# Patient Record
Sex: Female | Born: 1955 | Hispanic: Yes | Marital: Married | Smoking: Never smoker
Health system: Southern US, Community
[De-identification: ages and names within clinical notes are randomized; demographics above are authoritative.]

## PROBLEM LIST (undated history)

## (undated) DIAGNOSIS — R112 Nausea with vomiting, unspecified: Secondary | ICD-10-CM

## (undated) DIAGNOSIS — N83209 Unspecified ovarian cyst, unspecified side: Secondary | ICD-10-CM

## (undated) DIAGNOSIS — K8020 Calculus of gallbladder without cholecystitis without obstruction: Secondary | ICD-10-CM

## (undated) DIAGNOSIS — Z9889 Other specified postprocedural states: Secondary | ICD-10-CM

## (undated) DIAGNOSIS — M1990 Unspecified osteoarthritis, unspecified site: Secondary | ICD-10-CM

## (undated) DIAGNOSIS — I10 Essential (primary) hypertension: Secondary | ICD-10-CM

## (undated) DIAGNOSIS — T8859XA Other complications of anesthesia, initial encounter: Secondary | ICD-10-CM

## (undated) DIAGNOSIS — E119 Type 2 diabetes mellitus without complications: Secondary | ICD-10-CM

## (undated) HISTORY — DX: Unspecified ovarian cyst, unspecified side: N83.209

## (undated) HISTORY — PX: OTHER SURGICAL HISTORY: SHX169

## (undated) HISTORY — DX: Type 2 diabetes mellitus without complications: E11.9

---

## 2005-09-21 ENCOUNTER — Ambulatory Visit: Payer: Self-pay | Admitting: Family Medicine

## 2005-11-23 ENCOUNTER — Ambulatory Visit: Payer: Self-pay | Admitting: Family Medicine

## 2006-06-18 ENCOUNTER — Ambulatory Visit: Payer: Self-pay | Admitting: Family Medicine

## 2006-09-29 ENCOUNTER — Ambulatory Visit: Payer: Self-pay | Admitting: Family Medicine

## 2006-09-29 LAB — CONVERTED CEMR LAB
Calcium: 8.7 mg/dL (ref 8.4–10.5)
GFR calc Af Amer: 137 mL/min
GFR calc non Af Amer: 113 mL/min
HCT: 37.9 % (ref 36.0–46.0)
Hemoglobin: 13.1 g/dL (ref 12.0–15.0)
LDL Cholesterol: 128 mg/dL — ABNORMAL HIGH (ref 0–99)
MCV: 84.7 fL (ref 78.0–100.0)
Monocytes Absolute: 0.4 10*3/uL (ref 0.2–0.7)
Monocytes Relative: 7 % (ref 3.0–11.0)
Neutro Abs: 3.3 10*3/uL (ref 1.4–7.7)
Potassium: 3.8 meq/L (ref 3.5–5.1)
RBC: 4.48 M/uL (ref 3.87–5.11)
RDW: 13.5 % (ref 11.5–14.6)
TSH: 2.07 microintl units/mL (ref 0.35–5.50)
Total CHOL/HDL Ratio: 5
Total Protein: 7.3 g/dL (ref 6.0–8.3)
Triglycerides: 132 mg/dL (ref 0–149)
VLDL: 26 mg/dL (ref 0–40)
WBC: 5.9 10*3/uL (ref 4.5–10.5)

## 2006-10-06 ENCOUNTER — Encounter: Payer: Self-pay | Admitting: Family Medicine

## 2006-10-06 ENCOUNTER — Ambulatory Visit: Payer: Self-pay | Admitting: Family Medicine

## 2006-10-06 ENCOUNTER — Other Ambulatory Visit: Admission: RE | Admit: 2006-10-06 | Discharge: 2006-10-06 | Payer: Self-pay | Admitting: Family Medicine

## 2006-10-14 ENCOUNTER — Encounter: Admission: RE | Admit: 2006-10-14 | Discharge: 2006-10-14 | Payer: Self-pay | Admitting: Family Medicine

## 2006-11-15 ENCOUNTER — Encounter: Admission: RE | Admit: 2006-11-15 | Discharge: 2006-11-15 | Payer: Self-pay | Admitting: General Surgery

## 2006-11-16 ENCOUNTER — Encounter (INDEPENDENT_AMBULATORY_CARE_PROVIDER_SITE_OTHER): Payer: Self-pay | Admitting: Specialist

## 2006-11-16 ENCOUNTER — Ambulatory Visit (HOSPITAL_BASED_OUTPATIENT_CLINIC_OR_DEPARTMENT_OTHER): Admission: RE | Admit: 2006-11-16 | Discharge: 2006-11-16 | Payer: Self-pay | Admitting: General Surgery

## 2008-05-22 ENCOUNTER — Ambulatory Visit: Payer: Self-pay | Admitting: Family Medicine

## 2008-06-19 ENCOUNTER — Ambulatory Visit: Payer: Self-pay | Admitting: Family Medicine

## 2008-06-19 LAB — CONVERTED CEMR LAB
Bilirubin Urine: NEGATIVE
Blood in Urine, dipstick: NEGATIVE
Ketones, urine, test strip: NEGATIVE
Specific Gravity, Urine: 1.015
Urobilinogen, UA: 0.2
pH: 6

## 2008-06-25 ENCOUNTER — Encounter: Payer: Self-pay | Admitting: Family Medicine

## 2008-06-25 LAB — CONVERTED CEMR LAB
ALT: 19 U/L
AST: 28 U/L
Albumin: 3.5 g/dL
Alkaline Phosphatase: 81 U/L
BUN: 12 mg/dL
Basophils Absolute: 0 10*3/uL
Basophils Relative: 0.2 %
Bilirubin, Direct: 0.1 mg/dL
CO2: 27 meq/L
Calcium: 8.6 mg/dL
Chloride: 105 meq/L
Cholesterol: 175 mg/dL
Creatinine, Ser: 0.6 mg/dL
Eosinophils Absolute: 0.2 10*3/uL
Eosinophils Relative: 3.9 %
GFR calc Af Amer: 136 mL/min
GFR calc non Af Amer: 112 mL/min
Glucose, Bld: 94 mg/dL
HCT: 38.2 %
HDL: 36.8 mg/dL — ABNORMAL LOW
Hemoglobin: 12.9 g/dL
LDL Cholesterol: 116 mg/dL — ABNORMAL HIGH
Lymphocytes Relative: 28.4 %
MCHC: 33.7 g/dL
MCV: 86.2 fL
Monocytes Absolute: 0.4 10*3/uL
Monocytes Relative: 6.8 %
Neutro Abs: 3.3 10*3/uL
Neutrophils Relative %: 60.7 %
Platelets: 290 10*3/uL
Potassium: 3.8 meq/L
RBC: 4.43 M/uL
RDW: 13.5 %
Sodium: 138 meq/L
TSH: 1.28 u[IU]/mL
Total Bilirubin: 0.7 mg/dL
Total CHOL/HDL Ratio: 4.8
Total Protein: 7.5 g/dL
Triglycerides: 111 mg/dL
VLDL: 22 mg/dL
WBC: 5.5 10*3/uL

## 2008-06-27 ENCOUNTER — Encounter: Payer: Self-pay | Admitting: Family Medicine

## 2008-06-27 ENCOUNTER — Ambulatory Visit: Payer: Self-pay | Admitting: Family Medicine

## 2008-06-27 ENCOUNTER — Other Ambulatory Visit: Admission: RE | Admit: 2008-06-27 | Discharge: 2008-06-27 | Payer: Self-pay | Admitting: Family Medicine

## 2008-07-02 ENCOUNTER — Encounter: Payer: Self-pay | Admitting: Family Medicine

## 2008-11-01 ENCOUNTER — Encounter: Admission: RE | Admit: 2008-11-01 | Discharge: 2008-11-01 | Payer: Self-pay | Admitting: Family Medicine

## 2009-05-03 ENCOUNTER — Ambulatory Visit: Payer: Self-pay | Admitting: Family Medicine

## 2009-05-03 DIAGNOSIS — L259 Unspecified contact dermatitis, unspecified cause: Secondary | ICD-10-CM

## 2010-01-31 ENCOUNTER — Ambulatory Visit: Payer: Self-pay | Admitting: Family Medicine

## 2010-01-31 DIAGNOSIS — L509 Urticaria, unspecified: Secondary | ICD-10-CM

## 2010-05-26 ENCOUNTER — Ambulatory Visit: Payer: Self-pay | Admitting: Family Medicine

## 2010-10-07 NOTE — Assessment & Plan Note (Signed)
Summary: FLU-LIKE SXS // RS   Vital Signs:  Patient profile:   55 year old female Weight:      175 pounds O2 Sat:      96 % Temp:     98.2 degrees F Pulse rate:   88 / minute BP sitting:   130 / 90  (left arm)  Vitals Entered By: Pura Spice, RN (May 26, 2010 4:04 PM) CC: flu like sx's runny nose achy all over cough been on mucinex dm and this made her feel worse. took nyquil as well   History of Present Illness: here for 4 days of sinus pressure, HA, ST, and a dry cough. No fever. Using Tylenol and Mucinex.   Allergies (verified): No Known Drug Allergies  Past History:  Past Medical History: Reviewed history from 05/22/2008 and no changes required. Rutured ovarian cyst  Review of Systems  The patient denies anorexia, fever, weight loss, weight gain, vision loss, decreased hearing, hoarseness, chest pain, syncope, dyspnea on exertion, peripheral edema, hemoptysis, abdominal pain, melena, hematochezia, severe indigestion/heartburn, hematuria, incontinence, genital sores, muscle weakness, suspicious skin lesions, transient blindness, difficulty walking, depression, unusual weight change, abnormal bleeding, enlarged lymph nodes, angioedema, breast masses, and testicular masses.    Physical Exam  General:  Well-developed,well-nourished,in no acute distress; alert,appropriate and cooperative throughout examination Head:  Normocephalic and atraumatic without obvious abnormalities. No apparent alopecia or balding. Eyes:  No corneal or conjunctival inflammation noted. EOMI. Perrla. Funduscopic exam benign, without hemorrhages, exudates or papilledema. Vision grossly normal. Ears:  External ear exam shows no significant lesions or deformities.  Otoscopic examination reveals clear canals, tympanic membranes are intact bilaterally without bulging, retraction, inflammation or discharge. Hearing is grossly normal bilaterally. Nose:  External nasal examination shows no deformity or  inflammation. Nasal mucosa are pink and moist without lesions or exudates. Mouth:  Oral mucosa and oropharynx without lesions or exudates.  Teeth in good repair. Neck:  No deformities, masses, or tenderness noted. Lungs:  Normal respiratory effort, chest expands symmetrically. Lungs are clear to auscultation, no crackles or wheezes.   Impression & Recommendations:  Problem # 1:  ACUTE SINUSITIS, UNSPECIFIED (ICD-461.9)  Her updated medication list for this problem includes:    Zithromax Z-pak 250 Mg Tabs (Azithromycin) .Marland Kitchen... As directed  Complete Medication List: 1)  Zithromax Z-pak 250 Mg Tabs (Azithromycin) .... As directed  Patient Instructions: 1)  Please schedule a follow-up appointment as needed .  Prescriptions: ZITHROMAX Z-PAK 250 MG TABS (AZITHROMYCIN) as directed  #1 x 0   Entered and Authorized by:   Nelwyn Salisbury MD   Signed by:   Nelwyn Salisbury MD on 05/26/2010   Method used:   Electronically to        [id number]* (retail)       [address] [place], [place]  [address]       Ph: [phone]       Fax: [phone]   RxID:   [id number]

## 2010-10-07 NOTE — Assessment & Plan Note (Signed)
Summary: rash arms/itchy/cjr   Vital Signs:  Patient profile:   55 year old female Weight:      179 pounds BMI:     33.94 BP sitting:   130 / 92  (left arm) Cuff size:   regular  Vitals Entered By: Raechel Ache, RN (Jan 31, 2010 3:56 PM) CC: C/o itchy rash both arms about a week.   History of Present Illness: Here for 2 things. First she has had 2 weeks of an itching rash over both forearms. Triamcinolone cream does not help. Sun exposure and heat seem to bring it out. Second, for 2 months she has had intermittent sharp pains in the left hand and wrist at the thumb base. 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 using Advil.  Allergies (verified): No Known Drug Allergies  Past History:  Past Medical History: Reviewed history from 05/22/2008 and no changes required. Rutured ovarian cyst  Past Surgical History: Reviewed history from 06/27/2008 and no changes required. Lumpectomy, benign, right breast Caesarean section  Review of Systems  The patient denies anorexia, fever, weight loss, weight gain, vision loss, decreased hearing, hoarseness, chest pain, syncope, dyspnea on exertion, peripheral edema, prolonged cough, headaches, hemoptysis, abdominal pain, melena, hematochezia, severe indigestion/heartburn, hematuria, incontinence, genital sores, muscle weakness, suspicious skin lesions, transient blindness, difficulty walking, depression, unusual weight change, abnormal bleeding, enlarged lymph nodes, angioedema, breast masses, and testicular masses.    Physical Exam  General:  Well-developed,well-nourished,in no acute distress; alert,appropriate and cooperative throughout examination Extremities:  tender over the proximal left thumb with no swelling. Tender over the left radial wrist with full ROM.  Skin:  scattered red urticaria over both forearms   Impression & Recommendations:  Problem # 1:  DE QUERVAIN'S  TENOSYNOVITIS (ICD-727.04) Assessment New  Problem # 2:  URTICARIA (ICD-708.9) Assessment: Deteriorated  Complete Medication List: 1)  Ultravate 0.05 % Crea (Halobetasol propionate) .... Apply three times a day as needed 2)  Hydroxyzine Hcl 25 Mg Tabs (Hydroxyzine hcl) .Marland Kitchen.. 1 q 4 hours as needed itching 3)  Etodolac 500 Mg Tabs (Etodolac) .... Two times a day 4)  Zyrtec Allergy 10 Mg Tabs (Cetirizine hcl) .... Two times a day as needed hives  Patient Instructions: 1)  She will immobilize the wrist with a splint as much as possible, especially while at home. Apply ice as needed . Start Etodolac daily. As for the hives, start on Zyrtec two times a day throughout the summer months. Use Hydroxyzine for itching and switch to Ultravate cream.  2)  Please schedule a follow-up appointment as needed .  Prescriptions: ETODOLAC 500 MG TABS (ETODOLAC) two times a day  #60 x 11   Entered and Authorized by:   Nelwyn Salisbury MD   Signed by:   Nelwyn Salisbury MD on 01/31/2010   Method used:   Electronically to        [facility] [phone]* (retail)       [address] [place], [place]  [address]       Ph: [phone]       Fax: [phone]   RxID:   [id number] HYDROXYZINE HCL 25 MG TABS (HYDROXYZINE HCL) 1 q 4 hours as needed itching  #60 x 5   Entered and Authorized by:   Nelwyn Salisbury MD   Signed by:  Nelwyn Salisbury MD on 01/31/2010   Method used:   Electronically to        [id number]* (retail)       [address] [place], [place]  [address]       Ph: [phone]       Fax: [phone]   RxID:   [id number] ULTRAVATE 0.05 % CREA (HALOBETASOL PROPIONATE) apply three times a day as needed  #60 x 5   Entered and Authorized by:   Nelwyn Salisbury MD   Signed by:   Nelwyn Salisbury MD on 01/31/2010   Method used:   Electronically to        [facility] [phone]* (retail)       [address] [place], [place]  [address]       Ph: [phone]       Fax: [phone]   RxID:   [id number]

## 2011-01-23 NOTE — Assessment & Plan Note (Signed)
[name] OFFICE NOTE   [name], [name] [name]                          MRN:          [id number]  DATE:10/06/2006                            DOB:          [birth year]    This is a 54 year old woman here for a complete physical examination.  She has a couple of things to discuss.  First off, for the past month,  she has had intermittent sharp pain in the anterior right shoulder.  Tylenol does not seem to help.  She does do some lifting on her job.  There has been no history or trauma.  Also, for 5 years she has had a  tender lump in her right armpit.  Some days it is more tender than  others.  Today it is bothering her a bit more than usual.  It does not  seem to change in size.  There are no fevers, etc.  Of note, she has a  history of a benign lump being removed from the upper outer quadrant of  her right breast some years ago.  Her last breast exam and mammogram  were about 2 years ago.  About a year ago, when she came to meet me, she  had a history of an ovarian cyst on the right side.  I told her to see  Dr. Marina Gravel for an examination on February 7th of 2007.  His  examination was unremarkable.  She then had a pelvic ultrasound which  showed that the cyst had resolved.  In fact, she has done well with no  pelvic pain ever since.  Further details of her past medical history,  family history, social history, habits, etc., refer to our introductory  note with her dated September 21, 2005.   ALLERGIES:  None.   MEDICAL DECISION MAKING:  None.   OBJECTIVE:  Height 5 feet 2 inches, weight 164.  BP 112/84, pulse 72 and  regular.  GENERAL:  She appears healthy.  SKIN:  Clear.  EYES:  Clear.  EARS:  Clear.  OROPHARYNX: Clear.  NECK:  Supple without lymphadenopathy or masses.  LUNGS:  Clear.  CARDIAC:  Rate and rhythm regular without gallops, murmurs, or rubs.  Distal pulses are full.  BREAST EXAM:  Both  breasts are unremarkable, as is the left axilla.  However, the right axilla does have a 1 cm mobile, well-circumscribed,  tender lump which is somewhat deep.  It feels to be consistent with a  lymph node, although I suppose a sebaceous cyst is possible.  LUNGS:  Clear.  ABDOMEN:  Soft, normal bowel sounds, nontender, no masses.  PELVIC EXAM:  External genitalia within normal limits.  Vagina is clear,  cervix is clear.  Pap smear is obtained.  Uterus not enlarged, no adnexa  masses or tenderness.  EXTREMITIES:  No cyanosis, clubbing, or edema.  She is mildly tender in  the anterior right shoulder, but has no crepitus and has good range of  motion.  NEUROLOGIC EXAM:  Grossly intact.   She was here for fasting labs on January  23rd.  These were all  unremarkable.   ASSESSMENT/PLAN:  1. Complete physical.  Encouraged to get regular exercise.  2. History of right breast lump.  I will set her up for bilateral      mammograms.  3. Tender lump in the right axilla.  After she gets her mammogram, we      will have her see a general surgeon to discuss whether it is worth      excising this lump or not.  4. Shoulder bursitis.  Diclofenac 50 mg t.i.d. as needed.     Tera Mater. Clent Ridges, MD  Electronically Signed    SAF/MedQ  DD: 10/06/2006  DT: 10/06/2006  Job #: [id number]

## 2011-01-23 NOTE — Op Note (Signed)
NAME:  [name] [name], [name] [name]                 ACCOUNT NO.:  [id number]   MEDICAL RECORD NO.:  [id number]          PATIENT TYPE:  AMB   LOCATION:  DSC                          FACILITY:  [facility]   PHYSICIAN:  Cherylynn Ridges, M.D.    DATE OF BIRTH:  [birth year]   DATE OF PROCEDURE:  11/16/2006  DATE OF DISCHARGE:                               OPERATIVE REPORT   PREOPERATIVE DIAGNOSIS:  Right axillary lipoma.   POSTOPERATIVE DIAGNOSIS:  Right axillary lipoma.   PROCEDURE:  Excision of right axillary lipoma.   SURGEON:  Cherylynn Ridges, M.D.   ASSISTANT:  None.   ANESTHESIA:  Monitored anesthesia care with 1% Xylocaine and 0.25%  Marcaine with epinephrine.   ESTIMATED BLOOD LOSS:  Less than 20 mL.   COMPLICATIONS:  None.   CONDITION:  Stable.   FINDINGS:  The patient had about a 3 x 3 cm lipoma in the right axilla,  which is very superficial.   OPERATION:  The patient was taken to the operating room and placed on  the table in supine position.  After an adequate amount of IV sedation  was given, she was prepped and draped in the usual sterile manner  exposing the right axilla.   We anesthetized across the lipoma from anterior to posterior with a 27-  gauge needle and the anesthetic solution described.  We used about 20 mL  total.  We made an incision using a #15 blade and then we initially cut  into the lipoma.  Because it was very superficial we were able to  dissect it out from around surrounding tissue using just Metzenbaum  scissors and also a#15 blade.  There is good confluence of the lipoma  with the surrounding fatty tissue and although we believe we did get the  entire lipoma out, there were no distinct margins; however, the bulk of  the palpable mass was removed, if not all of it.   We did not go deep into the axilla as there were no palpable masses  there.  This was not a lymphadenopathy at all.  We irrigated with saline  after the obtaining hemostasis.  Then we closed using 4-0  Vicryl for the  deep layers and a 4-0 Monocryl to close the skin.  All needle, sponge  counts and instrument counts were correct.  A sterile dressing was  applied.      Cherylynn Ridges, M.D.  Electronically Signed     JOW/MEDQ  D:  11/16/2006  T:  11/17/2006  Job:  [id number]

## 2011-05-14 ENCOUNTER — Telehealth: Payer: Self-pay | Admitting: Family Medicine

## 2011-05-14 NOTE — Telephone Encounter (Signed)
I can see her anytime tomorrow

## 2011-05-14 NOTE — Telephone Encounter (Signed)
Pt called req work in [employer] for tomorrow after 3pm re: cough,chest congestion and sore throat. Pt can not come in sooner because she has to work.

## 2011-05-15 MED ORDER — AZITHROMYCIN 250 MG PO TABS: ORAL_TABLET | ORAL | Status: DC

## 2011-05-15 NOTE — Telephone Encounter (Signed)
Z pak sent in to pharmacy; called to make pt aware no answer.

## 2011-05-15 NOTE — Telephone Encounter (Signed)
Call in a Zpack  ?

## 2011-05-19 ENCOUNTER — Ambulatory Visit (INDEPENDENT_AMBULATORY_CARE_PROVIDER_SITE_OTHER): Payer: Managed Care, Other (non HMO) | Admitting: Family Medicine

## 2011-05-19 ENCOUNTER — Encounter: Payer: Self-pay | Admitting: Family Medicine

## 2011-05-19 VITALS — BP 126/88 | HR 93 | Temp 98.6°F | Wt 183.0 lb

## 2011-05-19 DIAGNOSIS — J329 Chronic sinusitis, unspecified: Secondary | ICD-10-CM

## 2011-05-19 MED ORDER — HALOBETASOL PROPIONATE 0.05 % EX CREA: TOPICAL_CREAM | Freq: Two times a day (BID) | CUTANEOUS | Status: DC

## 2011-05-19 MED ORDER — AZITHROMYCIN 250 MG PO TABS: ORAL_TABLET | ORAL | Status: AC

## 2011-05-19 NOTE — Progress Notes (Signed)
  Subjective:    Patient ID: [name] [name], female    DOB: 03/26/1956, 55 y.o.   MRN: [id number]  HPI Here for 2 weeks of stuffy head, sinus pain, PND, and a dry cough. No fever.    Review of Systems  Constitutional: Negative.   HENT: Positive for congestion, postnasal drip and sinus pressure.   Eyes: Positive for discharge.  Respiratory: Positive for cough and shortness of breath.        Objective:   Physical Exam  Constitutional: She appears well-developed and well-nourished.  HENT:  Right Ear: External ear normal.  Left Ear: External ear normal.  Nose: Nose normal.  Mouth/Throat: Oropharynx is clear and moist. No oropharyngeal exudate.  Eyes: Conjunctivae are normal. Pupils are equal, round, and reactive to light.  Neck: Neck supple. No thyromegaly present.  Pulmonary/Chest: Effort normal and breath sounds normal. No respiratory distress. She has no wheezes. She has no rales. She exhibits no tenderness.  Lymphadenopathy:    She has no cervical adenopathy.          Assessment & Plan:  Rest, fluids

## 2011-07-15 ENCOUNTER — Encounter: Payer: Self-pay | Admitting: Family Medicine

## 2011-07-15 ENCOUNTER — Ambulatory Visit (INDEPENDENT_AMBULATORY_CARE_PROVIDER_SITE_OTHER): Payer: Managed Care, Other (non HMO) | Admitting: Family Medicine

## 2011-07-15 VITALS — BP 126/88 | HR 96 | Temp 98.2°F | Wt 181.0 lb

## 2011-07-15 DIAGNOSIS — J329 Chronic sinusitis, unspecified: Secondary | ICD-10-CM

## 2011-07-15 MED ORDER — HYDROCODONE-HOMATROPINE 5-1.5 MG/5ML PO SYRP: 5.0000 mL | ORAL_SOLUTION | ORAL | Status: AC | PRN

## 2011-07-15 MED ORDER — LEVOFLOXACIN 500 MG PO TABS: 500.0000 mg | ORAL_TABLET | Freq: Every day | ORAL | Status: AC

## 2011-07-15 NOTE — Progress Notes (Signed)
  Subjective:    Patient ID: [name] [name], female    DOB: 1956-07-05, 55 y.o.   MRN: [id number]  HPI Here for 2 weeks of sinus pressure, HA, PND, and a dry cough. No fever. She was here in September and took a Zpack. This helped but she never totally got over it.    Review of Systems  Constitutional: Negative.   HENT: Positive for congestion, postnasal drip and sinus pressure.   Eyes: Negative.   Respiratory: Positive for cough.        Objective:   Physical Exam  Constitutional: She appears well-developed and well-nourished.  HENT:  Right Ear: External ear normal.  Left Ear: External ear normal.  Nose: Nose normal.  Mouth/Throat: Oropharynx is clear and moist. No oropharyngeal exudate.  Eyes: Conjunctivae are normal. Pupils are equal, round, and reactive to light.  Neck: No thyromegaly present.  Pulmonary/Chest: Effort normal and breath sounds normal. No respiratory distress. She has no wheezes. She has no rales. She exhibits no tenderness.  Lymphadenopathy:    She has no cervical adenopathy.          Assessment & Plan:  Recheck prn

## 2011-07-28 ENCOUNTER — Telehealth: Payer: Self-pay | Admitting: Family Medicine

## 2011-07-28 NOTE — Telephone Encounter (Signed)
Pt is on her last day of abx. She is expericing vaginal itching/tenderness. Would like cream or pill called to [facility]. Please call her when done. Thanks.

## 2011-07-28 NOTE — Telephone Encounter (Signed)
Call in Duflucan 150 mg prn, #1 with 11 rf

## 2011-07-29 MED ORDER — FLUCONAZOLE 150 MG PO TABS: 150.0000 mg | ORAL_TABLET | Freq: Once | ORAL | Status: AC

## 2011-07-29 NOTE — Telephone Encounter (Signed)
Attempt to call at hm# - VM- LMTCB if questions - med sent to [facility]

## 2011-10-02 ENCOUNTER — Other Ambulatory Visit: Payer: Managed Care, Other (non HMO)

## 2011-10-09 ENCOUNTER — Encounter: Payer: Self-pay | Admitting: Family Medicine

## 2011-10-09 ENCOUNTER — Other Ambulatory Visit (HOSPITAL_COMMUNITY)
Admission: RE | Admit: 2011-10-09 | Discharge: 2011-10-09 | Disposition: A | Discharge status: Home / Self Care | Payer: Managed Care, Other (non HMO) | Source: Ambulatory Visit | Attending: Family Medicine | Admitting: Family Medicine

## 2011-10-09 ENCOUNTER — Ambulatory Visit (INDEPENDENT_AMBULATORY_CARE_PROVIDER_SITE_OTHER): Payer: Managed Care, Other (non HMO) | Admitting: Family Medicine

## 2011-10-09 VITALS — BP 130/88 | HR 88 | Temp 98.1°F | Ht 61.25 in | Wt 179.0 lb

## 2011-10-09 DIAGNOSIS — Z0000 Encounter for general adult medical examination without abnormal findings: Secondary | ICD-10-CM

## 2011-10-09 DIAGNOSIS — Z01419 Encounter for gynecological examination (general) (routine) without abnormal findings: Secondary | ICD-10-CM | POA: Insufficient documentation

## 2011-10-09 LAB — HEPATIC FUNCTION PANEL
AST: 25 U/L (ref 0–37)
Alkaline Phosphatase: 89 U/L (ref 39–117)
Total Bilirubin: 0.7 mg/dL (ref 0.3–1.2)
Total Protein: 7.7 g/dL (ref 6.0–8.3)

## 2011-10-09 LAB — POCT URINALYSIS DIPSTICK
Glucose, UA: NEGATIVE
Ketones, UA: NEGATIVE
Nitrite, UA: NEGATIVE
Protein, UA: NEGATIVE
Spec Grav, UA: 1.02
Urobilinogen, UA: 0.2
pH, UA: 7

## 2011-10-09 LAB — BASIC METABOLIC PANEL
Calcium: 8.7 mg/dL (ref 8.4–10.5)
Chloride: 105 mEq/L (ref 96–112)
Potassium: 3.8 mEq/L (ref 3.5–5.1)

## 2011-10-09 LAB — CBC WITH DIFFERENTIAL/PLATELET
Basophils Absolute: 0 10*3/uL (ref 0.0–0.1)
MCHC: 33.6 g/dL (ref 30.0–36.0)
MCV: 85.8 fl (ref 78.0–100.0)
Neutro Abs: 3.6 10*3/uL (ref 1.4–7.7)
Neutrophils Relative %: 57.8 % (ref 43.0–77.0)
Platelets: 267 10*3/uL (ref 150.0–400.0)
RBC: 4.69 Mil/uL (ref 3.87–5.11)
RDW: 14.3 % (ref 11.5–14.6)

## 2011-10-09 LAB — LIPID PANEL
HDL: 39.5 mg/dL (ref >39.00)
LDL Cholesterol: 130 mg/dL — ABNORMAL HIGH (ref 0–99)
Total CHOL/HDL Ratio: 5
VLDL: 26.8 mg/dL (ref 0.0–40.0)

## 2011-10-09 MED ORDER — TERBINAFINE HCL 250 MG PO TABS: 250.0000 mg | ORAL_TABLET | Freq: Every day | ORAL | Status: AC

## 2011-10-09 NOTE — Progress Notes (Signed)
Addended by: Aniceto Boss A on: 10/09/2011 10:23 AM   Modules accepted: Orders

## 2011-10-09 NOTE — Progress Notes (Signed)
Subjective:    Patient ID: [name] [name], female    DOB: June 16, 1956, 56 y.o.   MRN: [id number]  HPI 56 yr old female for a cpx. She feels fine with no concerns. She has totally stopped having menses. No hot flashes. She never did get her colonoscopy last year.    Review of Systems  Constitutional: Negative.  Negative for fever, diaphoresis, activity change, appetite change, fatigue and unexpected weight change.  HENT: Negative.  Negative for hearing loss, ear pain, nosebleeds, congestion, sore throat, trouble swallowing, neck pain, neck stiffness, voice change and tinnitus.   Eyes: Negative.  Negative for photophobia, pain, discharge, redness and visual disturbance.  Respiratory: Negative.  Negative for apnea, cough, choking, chest tightness, shortness of breath, wheezing and stridor.   Cardiovascular: Negative.  Negative for chest pain, palpitations and leg swelling.  Gastrointestinal: Negative.  Negative for nausea, vomiting, abdominal pain, diarrhea, constipation, blood in stool, abdominal distention and rectal pain.  Genitourinary: Negative.  Negative for dysuria, urgency, frequency, hematuria, flank pain, vaginal bleeding, vaginal discharge, enuresis, difficulty urinating, vaginal pain and menstrual problem.  Musculoskeletal: Negative.  Negative for myalgias, back pain, joint swelling, arthralgias and gait problem.  Skin: Negative.  Negative for color change, pallor, rash and wound.  Neurological: Negative.  Negative for dizziness, tremors, seizures, syncope, speech difficulty, weakness, light-headedness, numbness and headaches.  Hematological: Negative.  Negative for adenopathy. Does not bruise/bleed easily.  Psychiatric/Behavioral: Negative.  Negative for hallucinations, behavioral problems, confusion, sleep disturbance, dysphoric mood and agitation. The patient is not nervous/anxious.        Objective:   Physical Exam  Constitutional: She appears well-developed and well-nourished. No  distress.  HENT:  Head: Normocephalic and atraumatic.  Right Ear: External ear normal.  Left Ear: External ear normal.  Nose: Nose normal.  Mouth/Throat: Oropharynx is clear and moist. No oropharyngeal exudate.  Eyes: Conjunctivae and EOM are normal. Pupils are equal, round, and reactive to light. Right eye exhibits no discharge. Left eye exhibits no discharge. No scleral icterus.  Neck: Normal range of motion. Neck supple. No JVD present. No thyromegaly present.  Cardiovascular: Normal rate, regular rhythm, normal heart sounds and intact distal pulses.  Exam reveals no gallop and no friction rub.   No murmur heard.      EKG normal   Pulmonary/Chest: Effort normal and breath sounds normal. No stridor. No respiratory distress. She has no wheezes. She has no rales. She exhibits no tenderness.  Abdominal: Soft. Normal appearance and bowel sounds are normal. She exhibits no distension, no abdominal bruit, no ascites and no mass. There is no hepatosplenomegaly. There is no tenderness. There is no rigidity, no rebound and no guarding. No hernia.  Genitourinary: Rectum normal, vagina normal and uterus normal. No breast swelling, tenderness, discharge or bleeding. Cervix exhibits no motion tenderness, no discharge and no friability. Right adnexum displays no mass, no tenderness and no fullness. Left adnexum displays no mass, no tenderness and no fullness. No erythema, tenderness or bleeding around the vagina. No vaginal discharge found.  Musculoskeletal: Normal range of motion. She exhibits no edema and no tenderness.  Lymphadenopathy:    She has no cervical adenopathy.  Neurological: She is alert. She has normal reflexes. No cranial nerve deficit. She exhibits normal muscle tone. Coordination normal.  Skin: Skin is warm and dry. No rash noted. She is not diaphoretic. No erythema. No pallor.       Both great toenails are yellow, thickened, and crusty   Psychiatric: She has a  normal mood and affect. Her  behavior is normal. Judgment and thought content normal.          Assessment & Plan:  Well exam. Get fasting labs. Set up colonoscopy. She will get a mammogram. Treat the toenails with Terbinafine

## 2011-10-13 ENCOUNTER — Encounter: Payer: Self-pay | Admitting: Family Medicine

## 2011-10-13 NOTE — Progress Notes (Signed)
Quick Note:  Left voice message and put a copy of results in mail. ______ 

## 2011-10-14 ENCOUNTER — Encounter: Payer: Self-pay | Admitting: Family Medicine

## 2011-10-14 NOTE — Progress Notes (Signed)
Quick Note:  I tried to reach pt by phone, no answer or option to leave a message. I did speak to pt about the lab results, not the pap results. ______

## 2011-11-02 ENCOUNTER — Encounter: Payer: Self-pay | Admitting: Gastroenterology

## 2011-11-11 ENCOUNTER — Ambulatory Visit (AMBULATORY_SURGERY_CENTER): Payer: Managed Care, Other (non HMO) | Admitting: *Deleted

## 2011-11-11 VITALS — Ht 61.0 in | Wt 185.1 lb

## 2011-11-11 DIAGNOSIS — Z1211 Encounter for screening for malignant neoplasm of colon: Secondary | ICD-10-CM

## 2011-11-11 MED ORDER — PEG-KCL-NACL-NASULF-NA ASC-C 100 G PO SOLR: ORAL | Status: DC

## 2011-11-12 ENCOUNTER — Encounter: Payer: Self-pay | Admitting: Gastroenterology

## 2011-11-25 ENCOUNTER — Encounter: Payer: Self-pay | Admitting: Gastroenterology

## 2011-11-25 ENCOUNTER — Ambulatory Visit (AMBULATORY_SURGERY_CENTER): Payer: Managed Care, Other (non HMO) | Admitting: Gastroenterology

## 2011-11-25 VITALS — BP 142/72 | HR 66 | Temp 98.6°F | Resp 17 | Ht 61.0 in | Wt 185.0 lb

## 2011-11-25 DIAGNOSIS — Z1211 Encounter for screening for malignant neoplasm of colon: Secondary | ICD-10-CM

## 2011-11-25 DIAGNOSIS — K5730 Diverticulosis of large intestine without perforation or abscess without bleeding: Secondary | ICD-10-CM | POA: Insufficient documentation

## 2011-11-25 HISTORY — PX: COLONOSCOPY: SHX174

## 2011-11-25 MED ORDER — SODIUM CHLORIDE 0.9 % IV SOLN: 500.0000 mL | INTRAVENOUS | Status: DC

## 2011-11-25 NOTE — Patient Instructions (Signed)
YOU HAD AN ENDOSCOPIC PROCEDURE TODAY AT THE [facility]: Refer to the procedure report that was given to you for any specific questions about what was found during the examination.  If the procedure report does not answer your questions, please call your gastroenterologist to clarify.  If you requested that your care partner not be given the details of your procedure findings, then the procedure report has been included in a sealed envelope for you to review at your convenience later.  YOU SHOULD EXPECT: Some feelings of bloating in the abdomen. Passage of more gas than usual.  Walking can help get rid of the air that was put into your GI tract during the procedure and reduce the bloating. If you had a lower endoscopy (such as a colonoscopy or flexible sigmoidoscopy) you may notice spotting of blood in your stool or on the toilet paper. If you underwent a bowel prep for your procedure, then you may not have a normal bowel movement for a few days.  DIET: Your first meal following the procedure should be a light meal and then it is ok to progress to your normal diet.  A half-sandwich or bowl of soup is an example of a good first meal.  Heavy or fried foods are harder to digest and may make you feel nauseous or bloated.  Likewise meals heavy in dairy and vegetables can cause extra gas to form and this can also increase the bloating.  Drink plenty of fluids but you should avoid alcoholic beverages for 24 hours.  ACTIVITY: Your care partner should take you home directly after the procedure.  You should plan to take it easy, moving slowly for the rest of the day.  You can resume normal activity the day after the procedure however you should NOT DRIVE or use heavy machinery for 24 hours (because of the sedation medicines used during the test).    SYMPTOMS TO REPORT IMMEDIATELY: A gastroenterologist can be reached at any hour.  During normal business hours, 8:30 AM to 5:00 PM Monday through Friday,  call [phone].  After hours and on weekends, please call the GI answering service at [phone] who will take a message and have the physician on call contact you.   Following lower endoscopy (colonoscopy or flexible sigmoidoscopy):  Excessive amounts of blood in the stool  Significant tenderness or worsening of abdominal pains  Swelling of the abdomen that is new, acute  Fever of 100F or higher    FOLLOW UP: If any biopsies were taken you will be contacted by phone or by letter within the next 1-3 weeks.  Call your gastroenterologist if you have not heard about the biopsies in 3 weeks.  Our staff will call the home number listed on your records the next business day following your procedure to check on you and address any questions or concerns that you may have at that time regarding the information given to you following your procedure. This is a courtesy call and so if there is no answer at the home number and we have not heard from you through the emergency physician on call, we will assume that you have returned to your regular daily activities without incident.  SIGNATURES/CONFIDENTIALITY: You and/or your care partner have signed paperwork which will be entered into your electronic medical record.  These signatures attest to the fact that that the information above on your After Visit Summary has been reviewed and is understood.  Full responsibility of the confidentiality   of this discharge information lies with you and/or your care-partner.     

## 2011-11-25 NOTE — Progress Notes (Signed)
Patient did not experience any of the following events: a burn prior to discharge; a fall within the facility; wrong site/side/patient/procedure/implant event; or a hospital transfer or hospital admission upon discharge from the facility. (G8907) Patient did not have preoperative order for IV antibiotic SSI prophylaxis. (G8918)  

## 2011-11-25 NOTE — Op Note (Signed)
[facility] [address] [place], [place]  [address]  COLONOSCOPY PROCEDURE REPORT  PATIENT:  [name] [name], [name] [name]  MR#:  [id number] BIRTHDATE:  [birth year], 56 yrs. old  GENDER:  female ENDOSCOPIST:  Vania Rea. Jarold Motto, MD, [facility] REF. BY:  Tera Mater. Clent Ridges, M.D. PROCEDURE DATE:  11/25/2011 PROCEDURE:  Average-risk screening colonoscopy G0121 ASA CLASS:  Class II INDICATIONS:  Elevated Risk Screening, Routine Risk Screening MEDICATIONS:   propofol (Diprivan) 150 mg IV  DESCRIPTION OF PROCEDURE:   After the risks and benefits and of the procedure were explained, informed consent was obtained. Digital rectal exam was performed and revealed no abnormalities. The LB PCF-H180AL [id number] endoscope was introduced through the anus and advanced to the cecum, which was identified by both the appendix and ileocecal valve.  The quality of the prep was excellent, using MoviPrep.  The instrument was then slowly withdrawn as the colon was fully examined. <<PROCEDUREIMAGES>>  FINDINGS:  There were mild diverticular changes in left colon. diverticulosis was found.  No polyps or cancers were seen.  This was otherwise a normal examination of the colon.   Retroflexed views in the rectum revealed no abnormalities.    The scope was then withdrawn from the patient and the procedure completed.  COMPLICATIONS:  None ENDOSCOPIC IMPRESSION: 1) Diverticulosis,mild,left sided diverticulosis 2) No polyps or cancers 3) Otherwise normal examination RECOMMENDATIONS: 1) High fiber diet 2) Continue current colorectal screening recommendations for "routine risk" patients with a repeat colonoscopy in 10 years.  REPEAT EXAM:  No  ______________________________ Vania Rea. Jarold Motto, MD, [facility]  CC:  n. eSIGNED:   Vania Rea. Shaquoya Cosper at 11/25/2011 09:48 AM  Mordecai Maes, [phone]

## 2011-11-26 ENCOUNTER — Telehealth: Payer: Self-pay | Admitting: *Deleted

## 2011-11-26 NOTE — Telephone Encounter (Signed)
  Follow up Call-  Call back number 11/25/2011  Post procedure Call Back phone  # [phone]-sisters number  Permission to leave phone message Yes     Patient questions:  Do you have a fever, pain , or abdominal swelling? no Pain Score  0 *  Have you tolerated food without any problems? yes  Have you been able to return to your normal activities? yes  Do you have any questions about your discharge instructions: Diet   no Medications  no Follow up visit  no  Do you have questions or concerns about your Care? no  Actions: * If pain score is 4 or above: No action needed, pain <4.

## 2011-12-25 ENCOUNTER — Encounter: Payer: Self-pay | Admitting: Family Medicine

## 2011-12-25 ENCOUNTER — Ambulatory Visit (INDEPENDENT_AMBULATORY_CARE_PROVIDER_SITE_OTHER): Payer: Managed Care, Other (non HMO) | Admitting: Family Medicine

## 2011-12-25 VITALS — BP 124/70 | HR 78 | Temp 98.7°F | Wt 182.0 lb

## 2011-12-25 DIAGNOSIS — S76219A Strain of adductor muscle, fascia and tendon of unspecified thigh, initial encounter: Secondary | ICD-10-CM

## 2011-12-25 DIAGNOSIS — IMO0002 Reserved for concepts with insufficient information to code with codable children: Secondary | ICD-10-CM

## 2011-12-25 MED ORDER — DICLOFENAC SODIUM 75 MG PO TBEC: 75.0000 mg | DELAYED_RELEASE_TABLET | Freq: Two times a day (BID) | ORAL | Status: AC | PRN

## 2011-12-25 NOTE — Progress Notes (Signed)
  Subjective:    Patient ID: [name] [name], female    DOB: 16-Oct-1955, 56 y.o.   MRN: [id number]  HPI Here for 6 weeks of pain in the left groin. She is not sure what may have caused this. No hx of trauma. Advil helps for awhile but the pain comes back. Walking is fairly easy but going up steps is painful.    Review of Systems  Constitutional: Negative.        Objective:   Physical Exam  Constitutional:       She has some pain getting on the exam table  Musculoskeletal:       Tender in the left groin at the insertion of the hip flexor, full ROM. No masses or adenopathy           Assessment & Plan:  Groin strain. Rest, ice, Diclofenac prn. Refer to PT

## 2011-12-31 ENCOUNTER — Ambulatory Visit: Payer: Managed Care, Other (non HMO) | Attending: Family Medicine | Admitting: Physical Therapy

## 2011-12-31 DIAGNOSIS — M25559 Pain in unspecified hip: Secondary | ICD-10-CM | POA: Insufficient documentation

## 2011-12-31 DIAGNOSIS — IMO0001 Reserved for inherently not codable concepts without codable children: Secondary | ICD-10-CM | POA: Insufficient documentation

## 2011-12-31 DIAGNOSIS — M2569 Stiffness of other specified joint, not elsewhere classified: Secondary | ICD-10-CM | POA: Insufficient documentation

## 2012-01-06 ENCOUNTER — Ambulatory Visit: Payer: Managed Care, Other (non HMO) | Attending: Family Medicine | Admitting: Physical Therapy

## 2012-01-06 DIAGNOSIS — M25559 Pain in unspecified hip: Secondary | ICD-10-CM | POA: Insufficient documentation

## 2012-01-06 DIAGNOSIS — M2569 Stiffness of other specified joint, not elsewhere classified: Secondary | ICD-10-CM | POA: Insufficient documentation

## 2012-01-06 DIAGNOSIS — IMO0001 Reserved for inherently not codable concepts without codable children: Secondary | ICD-10-CM | POA: Insufficient documentation

## 2012-01-13 ENCOUNTER — Ambulatory Visit: Payer: Managed Care, Other (non HMO) | Admitting: Physical Therapy

## 2012-01-20 ENCOUNTER — Ambulatory Visit: Payer: Managed Care, Other (non HMO) | Admitting: Rehabilitation

## 2012-01-29 ENCOUNTER — Ambulatory Visit: Payer: Managed Care, Other (non HMO) | Admitting: Physical Therapy

## 2012-02-04 ENCOUNTER — Ambulatory Visit: Payer: Managed Care, Other (non HMO) | Admitting: Physical Therapy

## 2012-04-15 ENCOUNTER — Other Ambulatory Visit: Payer: Self-pay | Admitting: Family Medicine

## 2012-04-15 DIAGNOSIS — Z1231 Encounter for screening mammogram for malignant neoplasm of breast: Secondary | ICD-10-CM

## 2012-05-06 ENCOUNTER — Ambulatory Visit
Admission: RE | Admit: 2012-05-06 | Discharge: 2012-05-06 | Disposition: A | Discharge status: Home / Self Care | Payer: Managed Care, Other (non HMO) | Source: Ambulatory Visit | Attending: Family Medicine | Admitting: Family Medicine

## 2012-05-06 DIAGNOSIS — Z1231 Encounter for screening mammogram for malignant neoplasm of breast: Secondary | ICD-10-CM

## 2012-09-26 ENCOUNTER — Ambulatory Visit (INDEPENDENT_AMBULATORY_CARE_PROVIDER_SITE_OTHER): Payer: Managed Care, Other (non HMO) | Admitting: Family Medicine

## 2012-09-26 ENCOUNTER — Encounter: Payer: Self-pay | Admitting: Family Medicine

## 2012-09-26 VITALS — BP 138/84 | HR 111 | Temp 98.4°F | Wt 179.0 lb

## 2012-09-26 DIAGNOSIS — J069 Acute upper respiratory infection, unspecified: Secondary | ICD-10-CM

## 2012-09-26 MED ORDER — HYDROCODONE-HOMATROPINE 5-1.5 MG/5ML PO SYRP: 5.0000 mL | ORAL_SOLUTION | Freq: Three times a day (TID) | ORAL | Status: DC | PRN

## 2012-09-26 NOTE — Patient Instructions (Signed)

## 2012-09-26 NOTE — Progress Notes (Signed)
Chief Complaint  Patient presents with  . Cough    runny nose, fever, headache x 3 days     HPI:  -started: 3 days ago -symptoms:nasal congestion, sore throat, cough, subjective fever -denies:fever, SOB, NVD, tooth pain -has tried: musinex -sick contacts: husband sick with similar symptoms -Hx of: no hx of asthma or allergies -had flu shot   ROS: See pertinent positives and negatives per HPI.  Past Medical History  Diagnosis Date  . Ruptured ovarian cyst     Family History  Problem Relation Age of Onset  . Cancer Other     breast cancer  . Colon cancer Neg Hx   . Esophageal cancer Neg Hx   . Stomach cancer Neg Hx   . Rectal cancer Neg Hx     History   Social History  . Marital Status: Married    Spouse Name: N/A    Number of Children: N/A  . Years of Education: N/A   Social History Main Topics  . Smoking status: Never Smoker   . Smokeless tobacco: Never Used  . Alcohol Use: 0.0 oz/week     Comment: maybe twice a month  . Drug Use: No  . Sexually Active: None   Other Topics Concern  . None   Social History Narrative   Ambulance person use-yesDrug use-no    Current outpatient prescriptions:diclofenac (VOLTAREN) 75 MG EC tablet, Take 1 tablet (75 mg total) by mouth 2 (two) times daily as needed (pain)., Disp: 60 tablet, Rfl: 2;  HYDROcodone-homatropine (HYCODAN) 5-1.5 MG/5ML syrup, Take 5 mLs by mouth every 8 (eight) hours as needed for cough., Disp: 120 mL, Rfl: 0;  terbinafine (LAMISIL) 250 MG tablet, Take 1 tablet (250 mg total) by mouth daily., Disp: 30 tablet, Rfl: 5  EXAM:  Filed Vitals:   09/26/12 1449  BP: 138/84  Pulse: 111  Temp: 98.4 F (36.9 C)    There is no height on file to calculate BMI.  GENERAL: vitals reviewed and listed above, alert, oriented, appears well hydrated and in no acute distress  HEENT: atraumatic, conjunttiva clear, no obvious abnormalities on inspection of external nose and ears, normal appearance of ear  canals and TMs, clear nasal congestion, mild post oropharyngeal erythema with PND, no tonsillar edema or exudate, no sinus TTP  NECK: no obvious masses on inspection  LUNGS: clear to auscultation bilaterally, no wheezes, rales or rhonchi, good air movement  CV: HRRR, no peripheral edema  MS: moves all extremities without noticeable abnormality  PSYCH: pleasant and cooperative, no obvious depression or anxiety  ASSESSMENT AND PLAN:  Discussed the following assessment and plan:  1. Upper respiratory infection  HYDROcodone-homatropine (HYCODAN) 5-1.5 MG/5ML syrup   -likely viral, advised supportive care and return precautions - cough medication provided (discussed risks, pt has taken this in the past and tolerated well per her report) -Patient advised to return or notify a doctor immediately if symptoms worsen or persist or new concerns arise.  Patient Instructions  INSTRUCTIONS FOR UPPER RESPIRATORY INFECTION:  -plenty of rest and fluids  -nasal saline wash 2-3 times daily (use prepackaged nasal saline or bottled/distilled water if making your own)   -can use sinex nasal spray for drainage and nasal congestion - but do NOT use longer then 3-4 days  -can use tylenol or ibuprofen as directed for aches and sorethroat  -in the winter time, using a humidifier at night is helpful (please follow cleaning instructions)  -if you are taking a cough medication - use  only as directed, may also try a teaspoon of honey to coat the throat and throat lozenges  -for sore throat, salt water gargles can help  -follow up if you have fevers, facial pain, tooth pain, difficulty breathing or are worsening or not getting better in 5-7 days      KIM, HANNAH R.

## 2013-03-11 ENCOUNTER — Other Ambulatory Visit: Payer: Self-pay | Admitting: Family Medicine

## 2013-08-10 ENCOUNTER — Ambulatory Visit (INDEPENDENT_AMBULATORY_CARE_PROVIDER_SITE_OTHER): Payer: Managed Care, Other (non HMO) | Admitting: Family Medicine

## 2013-08-10 ENCOUNTER — Encounter: Payer: Self-pay | Admitting: Family Medicine

## 2013-08-10 VITALS — BP 140/80 | HR 88 | Temp 98.1°F | Wt 175.0 lb

## 2013-08-10 DIAGNOSIS — B9789 Other viral agents as the cause of diseases classified elsewhere: Secondary | ICD-10-CM

## 2013-08-10 DIAGNOSIS — B349 Viral infection, unspecified: Secondary | ICD-10-CM

## 2013-08-10 MED ORDER — PROMETHAZINE HCL 25 MG PO TABS: 25.0000 mg | ORAL_TABLET | ORAL | Status: DC | PRN

## 2013-08-10 NOTE — Progress Notes (Signed)
Pre visit review using our clinic review tool, if applicable. No additional management support is needed unless otherwise documented below in the visit note. 

## 2013-08-10 NOTE — Progress Notes (Signed)
   Subjective:    Patient ID: [name] [name], female    DOB: [birth year], 57 y.o.   MRN: [id number]  HPI Here for 3 days of nausea and vomiting, body aches, and a dry cough. No diarrhea or ST. Using Hydromet for the cough. She has been unable to keep anything down for the past 24 hours.    Review of Systems  Constitutional: Negative.   HENT: Negative.   Eyes: Negative.   Respiratory: Positive for cough.   Gastrointestinal: Positive for nausea and vomiting. Negative for abdominal pain, diarrhea, constipation, blood in stool and abdominal distention.       Objective:   Physical Exam  Constitutional: She appears well-developed and well-nourished.  HENT:  Right Ear: External ear normal.  Left Ear: External ear normal.  Nose: Nose normal.  Mouth/Throat: Oropharynx is clear and moist.  Eyes: Conjunctivae are normal.  Pulmonary/Chest: Effort normal and breath sounds normal.  Abdominal: Soft. Bowel sounds are normal. She exhibits no distension and no mass. There is no tenderness. There is no rebound and no guarding.  Lymphadenopathy:    She has no cervical adenopathy.          Assessment & Plan:  Use Phenergan prn. Try to get as much fluids in as possible. Recheck prn

## 2013-10-12 ENCOUNTER — Other Ambulatory Visit: Payer: Self-pay | Admitting: Family Medicine

## 2014-07-13 ENCOUNTER — Other Ambulatory Visit: Payer: Self-pay

## 2014-07-13 DIAGNOSIS — Z1231 Encounter for screening mammogram for malignant neoplasm of breast: Secondary | ICD-10-CM

## 2014-08-10 ENCOUNTER — Ambulatory Visit: Payer: Managed Care, Other (non HMO)

## 2014-08-24 ENCOUNTER — Other Ambulatory Visit (INDEPENDENT_AMBULATORY_CARE_PROVIDER_SITE_OTHER): Payer: Managed Care, Other (non HMO)

## 2014-08-24 ENCOUNTER — Encounter: Payer: Self-pay | Admitting: Family Medicine

## 2014-08-24 ENCOUNTER — Ambulatory Visit (INDEPENDENT_AMBULATORY_CARE_PROVIDER_SITE_OTHER): Payer: Managed Care, Other (non HMO) | Admitting: Family Medicine

## 2014-08-24 VITALS — BP 148/82 | HR 89 | Temp 98.3°F | Ht 61.0 in | Wt 178.7 lb

## 2014-08-24 DIAGNOSIS — Z0000 Encounter for general adult medical examination without abnormal findings: Secondary | ICD-10-CM

## 2014-08-24 DIAGNOSIS — J0100 Acute maxillary sinusitis, unspecified: Secondary | ICD-10-CM

## 2014-08-24 LAB — POCT URINALYSIS DIPSTICK
BILIRUBIN UA: NEGATIVE
Blood, UA: NEGATIVE
Glucose, UA: NEGATIVE
Ketones, UA: NEGATIVE
Leukocytes, UA: NEGATIVE
NITRITE UA: NEGATIVE
Protein, UA: NEGATIVE
Spec Grav, UA: 1.02
UROBILINOGEN UA: 0.2
pH, UA: 7

## 2014-08-24 LAB — COMPREHENSIVE METABOLIC PANEL
ALK PHOS: 95 U/L (ref 39–117)
ALT: 14 U/L (ref 0–35)
AST: 25 U/L (ref 0–37)
Albumin: 3.7 g/dL (ref 3.5–5.2)
BUN: 12 mg/dL (ref 6–23)
CO2: 26 mEq/L (ref 19–32)
CREATININE: 0.6 mg/dL (ref 0.4–1.2)
Calcium: 8.7 mg/dL (ref 8.4–10.5)
Chloride: 103 mEq/L (ref 96–112)
GFR: 113.95 mL/min (ref >60.00)
Glucose, Bld: 129 mg/dL — ABNORMAL HIGH (ref 70–99)
Potassium: 3.7 mEq/L (ref 3.5–5.1)
Sodium: 137 mEq/L (ref 135–145)
Total Bilirubin: 0.7 mg/dL (ref 0.2–1.2)
Total Protein: 7.6 g/dL (ref 6.0–8.3)

## 2014-08-24 LAB — CBC WITH DIFFERENTIAL/PLATELET
Basophils Absolute: 0 10*3/uL (ref 0.0–0.1)
Basophils Relative: 0.8 % (ref 0.0–3.0)
EOS ABS: 0.3 10*3/uL (ref 0.0–0.7)
EOS PCT: 4.6 % (ref 0.0–5.0)
HEMATOCRIT: 40.6 % (ref 36.0–46.0)
Hemoglobin: 13.1 g/dL (ref 12.0–15.0)
LYMPHS ABS: 2.1 10*3/uL (ref 0.7–4.0)
Lymphocytes Relative: 35.2 % (ref 12.0–46.0)
MCHC: 32.4 g/dL (ref 30.0–36.0)
MCV: 85.3 fl (ref 78.0–100.0)
MONOS PCT: 5.8 % (ref 3.0–12.0)
Monocytes Absolute: 0.3 10*3/uL (ref 0.1–1.0)
NEUTROS ABS: 3.1 10*3/uL (ref 1.4–7.7)
NEUTROS PCT: 53.6 % (ref 43.0–77.0)
PLATELETS: 290 10*3/uL (ref 150.0–400.0)
RBC: 4.76 Mil/uL (ref 3.87–5.11)
RDW: 13.9 % (ref 11.5–15.5)
WBC: 5.9 10*3/uL (ref 4.0–10.5)

## 2014-08-24 LAB — LIPID PANEL
CHOL/HDL RATIO: 6
Cholesterol: 210 mg/dL — ABNORMAL HIGH (ref 0–200)
HDL: 34.3 mg/dL — ABNORMAL LOW (ref >39.00)
LDL Cholesterol: 150 mg/dL — ABNORMAL HIGH (ref 0–99)
NonHDL: 175.7
Triglycerides: 129 mg/dL (ref 0.0–149.0)
VLDL: 25.8 mg/dL (ref 0.0–40.0)

## 2014-08-24 MED ORDER — BENZONATATE 100 MG PO CAPS: 100.0000 mg | ORAL_CAPSULE | Freq: Two times a day (BID) | ORAL | Status: DC | PRN

## 2014-08-24 MED ORDER — AMOXICILLIN-POT CLAVULANATE 875-125 MG PO TABS: 1.0000 | ORAL_TABLET | Freq: Two times a day (BID) | ORAL | Status: DC

## 2014-08-24 NOTE — Progress Notes (Signed)
   HPI:  Sinus congestion -started: started about 1 month ago -symptoms:nasal congestion, sore throat, cough - has not gotten better and has thick mucus and sinus pain in L maxillary sinus pain and L upper tooth pain -denies:fever, SOB, NVD -has tried: tessalon perles from [facility] a few weeks ago and this was helpful but is gone now -sick contacts/travel/risks: denies flu exposure, tick exposure or or Ebola risks -reports hycodan makes her nauseous and makes her throw up  ROS: See pertinent positives and negatives per HPI.  Past Medical History  Diagnosis Date  . Ruptured ovarian cyst     Past Surgical History  Procedure Laterality Date  . Lumpectomy,benign, right breast      x2  . Cesarean section      Family History  Problem Relation Age of Onset  . Cancer Other     breast cancer  . Colon cancer Neg Hx   . Esophageal cancer Neg Hx   . Stomach cancer Neg Hx   . Rectal cancer Neg Hx     History   Social History  . Marital Status: Married    Spouse Name: N/A    Number of Children: N/A  . Years of Education: N/A   Social History Main Topics  . Smoking status: Never Smoker   . Smokeless tobacco: Never Used  . Alcohol Use: 0.0 oz/week     Comment: maybe twice a month  . Drug Use: No  . Sexual Activity: None   Other Topics Concern  . None   Social History Narrative   Married   Never Smoker   Alcohol use-yes   Drug use-no          Current outpatient prescriptions: halobetasol (ULTRAVATE) 0.05 % cream, APPLY TO THE AFFECTED AREA TWICE A DAY, Disp: 50 g, Rfl: 0;  amoxicillin-clavulanate (AUGMENTIN) 875-125 MG per tablet, Take 1 tablet by mouth 2 (two) times daily., Disp: 20 tablet, Rfl: 0;  benzonatate (TESSALON) 100 MG capsule, Take 1 capsule (100 mg total) by mouth 2 (two) times daily as needed for cough., Disp: 20 capsule, Rfl: 0  EXAM:  Filed Vitals:   08/24/14 1503  BP: 148/82  Pulse: 89  Temp: 98.3 F (36.8 C)    Body mass index is 33.78  kg/(m^2).  GENERAL: vitals reviewed and listed above, alert, oriented, appears well hydrated and in no acute distress  HEENT: atraumatic, conjunttiva clear, no obvious abnormalities on inspection of external nose and ears, normal appearance of ear canals and TMs, thick nasal congestion, mild post oropharyngeal erythema with PND, no tonsillar edema or exudate, L sinus TTP  NECK: no obvious masses on inspection  LUNGS: clear to auscultation bilaterally, no wheezes, rales or rhonchi, good air movement  CV: HRRR, no peripheral edema  MS: moves all extremities without noticeable abnormality  PSYCH: pleasant and cooperative, no obvious depression or anxiety  ASSESSMENT AND PLAN:  Discussed the following assessment and plan:  Acute maxillary sinusitis, recurrence not specified - Plan: amoxicillin-clavulanate (AUGMENTIN) 875-125 MG per tablet, benzonatate (TESSALON) 100 MG capsule  - We discussed potential etiologies, with sinusitis likely. We discussed treatment side effects, likely course,  transmission, and signs of developing a serious illness. -of course, we advised to return or notify a doctor immediately if symptoms worsen or persist or new concerns arise.    There are no Patient Instructions on file for this visit.   Colin Benton R.

## 2014-08-24 NOTE — Progress Notes (Signed)
Pre visit review using our clinic review tool, if applicable. No additional management support is needed unless otherwise documented below in the visit note. 

## 2014-08-27 ENCOUNTER — Other Ambulatory Visit: Payer: Managed Care, Other (non HMO)

## 2014-08-27 DIAGNOSIS — E0580 Other thyrotoxicosis without thyrotoxic crisis or storm: Secondary | ICD-10-CM

## 2014-08-27 DIAGNOSIS — E038 Other specified hypothyroidism: Secondary | ICD-10-CM

## 2014-08-28 LAB — TSH: TSH: 1.522 u[IU]/mL (ref 0.350–4.500)

## 2014-09-14 ENCOUNTER — Encounter: Payer: Self-pay | Admitting: Family Medicine

## 2014-09-14 ENCOUNTER — Ambulatory Visit (INDEPENDENT_AMBULATORY_CARE_PROVIDER_SITE_OTHER): Payer: Managed Care, Other (non HMO) | Admitting: Family Medicine

## 2014-09-14 VITALS — BP 137/76 | HR 83 | Temp 98.4°F | Ht 61.0 in | Wt 178.0 lb

## 2014-09-14 DIAGNOSIS — Z0000 Encounter for general adult medical examination without abnormal findings: Secondary | ICD-10-CM

## 2014-09-14 DIAGNOSIS — Z23 Encounter for immunization: Secondary | ICD-10-CM

## 2014-09-14 DIAGNOSIS — E119 Type 2 diabetes mellitus without complications: Secondary | ICD-10-CM

## 2014-09-14 LAB — MICROALBUMIN / CREATININE URINE RATIO
Creatinine,U: 102.4 mg/dL
Microalb Creat Ratio: 2.1 mg/g (ref 0.0–30.0)
Microalb, Ur: 2.1 mg/dL — ABNORMAL HIGH (ref 0.0–1.9)

## 2014-09-14 LAB — HEMOGLOBIN A1C: HEMOGLOBIN A1C: 7.9 % — AB (ref 4.6–6.5)

## 2014-09-14 MED ORDER — HALOBETASOL PROPIONATE 0.05 % EX CREA: TOPICAL_CREAM | CUTANEOUS | Status: DC

## 2014-09-14 NOTE — Progress Notes (Signed)
Pre visit review using our clinic review tool, if applicable. No additional management support is needed unless otherwise documented below in the visit note. 

## 2014-09-14 NOTE — Progress Notes (Signed)
   Subjective:    Patient ID: [name] [name], female    DOB: [birth year], 57 y.o.   MRN: [id number]  HPI 59 yr old female for a cpx. She feels well. Her labs reveal a new diagnosis of type 2 diabetes with a fasting glucose of 129. Her LDL is up to 150.    Review of Systems  Constitutional: Negative.   HENT: Negative.   Eyes: Negative.   Respiratory: Negative.   Cardiovascular: Negative.   Gastrointestinal: Negative.   Genitourinary: Negative for dysuria, urgency, frequency, hematuria, flank pain, decreased urine volume, enuresis, difficulty urinating, pelvic pain and dyspareunia.  Musculoskeletal: Negative.   Skin: Negative.   Neurological: Negative.   Psychiatric/Behavioral: Negative.        Objective:   Physical Exam  Constitutional: She is oriented to person, place, and time. She appears well-developed and well-nourished. No distress.  HENT:  Head: Normocephalic and atraumatic.  Right Ear: External ear normal.  Left Ear: External ear normal.  Nose: Nose normal.  Mouth/Throat: Oropharynx is clear and moist. No oropharyngeal exudate.  Eyes: Conjunctivae and EOM are normal. Pupils are equal, round, and reactive to light. No scleral icterus.  Neck: Normal range of motion. Neck supple. No JVD present. No thyromegaly present.  Cardiovascular: Normal rate, regular rhythm, normal heart sounds and intact distal pulses.  Exam reveals no gallop and no friction rub.   No murmur heard. EKG normal   Pulmonary/Chest: Effort normal and breath sounds normal. No respiratory distress. She has no wheezes. She has no rales. She exhibits no tenderness.  Abdominal: Soft. Bowel sounds are normal. She exhibits no distension and no mass. There is no tenderness. There is no rebound and no guarding.  Musculoskeletal: Normal range of motion. She exhibits no edema or tenderness.  Lymphadenopathy:    She has no cervical adenopathy.  Neurological: She is alert and oriented to person, place, and time. She has  normal reflexes. No cranial nerve deficit. She exhibits normal muscle tone. Coordination normal.  Skin: Skin is warm and dry. No rash noted. No erythema.  Psychiatric: She has a normal mood and affect. Her behavior is normal. Judgment and thought content normal.          Assessment & Plan:  Well exam. We discussed the importance of diet and exercise and her need to lose weight. We will refer her to Nutrition for diabetic education. Advised her to walk vigorously for 30-45 minutes 6 days a week. Get a microalbumen and an A1c today. Recheck in one month

## 2014-09-25 MED ORDER — METFORMIN HCL 500 MG PO TABS: 500.0000 mg | ORAL_TABLET | Freq: Two times a day (BID) | ORAL | Status: DC

## 2014-09-25 MED ORDER — LISINOPRIL 10 MG PO TABS: 10.0000 mg | ORAL_TABLET | Freq: Every day | ORAL | Status: DC

## 2014-09-25 NOTE — Addendum Note (Signed)
Addended by: Aggie Hacker A on: 09/25/2014 04:39 PM   Modules accepted: Orders

## 2014-10-26 ENCOUNTER — Ambulatory Visit (INDEPENDENT_AMBULATORY_CARE_PROVIDER_SITE_OTHER): Payer: Managed Care, Other (non HMO) | Admitting: Family Medicine

## 2014-10-26 ENCOUNTER — Encounter: Payer: Self-pay | Admitting: Family Medicine

## 2014-10-26 VITALS — BP 132/71 | HR 76 | Temp 98.3°F | Ht 61.0 in | Wt 178.0 lb

## 2014-10-26 DIAGNOSIS — E119 Type 2 diabetes mellitus without complications: Secondary | ICD-10-CM

## 2014-10-26 DIAGNOSIS — S90121A Contusion of right lesser toe(s) without damage to nail, initial encounter: Secondary | ICD-10-CM

## 2014-10-26 NOTE — Progress Notes (Signed)
Pre visit review using our clinic review tool, if applicable. No additional management support is needed unless otherwise documented below in the visit note. 

## 2014-10-26 NOTE — Progress Notes (Signed)
   Subjective:    Patient ID: [name] [name], female    DOB: 1955-12-06, 59 y.o.   MRN: [id number]  HPI Here for several things. First about 3 weeks ago she dropped a can of food on her right 4th toe and it swelled up and was quite painful. It has been slowly improving since then. Also one month ago she was started on Metformin and Lisinopril for her diabetes. She has felt fine.    Review of Systems  Constitutional: Negative.   Respiratory: Negative.   Cardiovascular: Negative.        Objective:   Physical Exam  Constitutional: She appears well-developed and well-nourished.  Cardiovascular: Normal rate, regular rhythm, normal heart sounds and intact distal pulses.   Pulmonary/Chest: Effort normal and breath sounds normal.  Musculoskeletal:  The right 4th toe is mildly tender but not swollen or ecchymotic. Full ROM          Assessment & Plan:  The toe was contused but is healing as expected. Her fasting glucose today is 109. Stay on current meds and we will repeat an A1c in 2 months

## 2014-11-02 ENCOUNTER — Encounter: Payer: Self-pay | Admitting: Family Medicine

## 2014-11-02 ENCOUNTER — Ambulatory Visit (INDEPENDENT_AMBULATORY_CARE_PROVIDER_SITE_OTHER): Payer: Managed Care, Other (non HMO) | Admitting: Family Medicine

## 2014-11-02 ENCOUNTER — Telehealth: Payer: Self-pay | Admitting: Family Medicine

## 2014-11-02 VITALS — BP 118/81 | HR 77 | Temp 98.4°F | Ht 61.0 in | Wt 174.0 lb

## 2014-11-02 DIAGNOSIS — H8110 Benign paroxysmal vertigo, unspecified ear: Secondary | ICD-10-CM

## 2014-11-02 MED ORDER — MECLIZINE HCL 25 MG PO TABS: 25.0000 mg | ORAL_TABLET | ORAL | Status: DC | PRN

## 2014-11-02 NOTE — Telephone Encounter (Signed)
Dr. Sarajane Jews would like to see pt and I did speak with her, she will keep appointment for today.

## 2014-11-02 NOTE — Progress Notes (Signed)
Pre visit review using our clinic review tool, if applicable. No additional management support is needed unless otherwise documented below in the visit note. 

## 2014-11-02 NOTE — Telephone Encounter (Signed)
Pt states after being on the lisinopril (PRINIVIL,ZESTRIL) 10 MG tablet and metFORMIN (GLUCOPHAGE) 500 MG tablet She has begun to be dizzy in the am when she gets out of bed. Even when she gets up to the bathroom the room is spinning.  Pt states only when she looks up or down.  This has only started after these meds for one month. Pt wants to know if she should come in? Advised pt to make appt. Made appt for pt at 2pm However, pt states if the dr doesn't think she needs, can call and advise.

## 2014-11-02 NOTE — Progress Notes (Signed)
   Subjective:    Patient ID: [name] [name], female    DOB: May 03, 1956, 59 y.o.   MRN: [id number]  HPI Here for 6 days of intermittent dizziness which she described as the room spinning around her. This occurs when she sits up from bed or when she raises her head up quickly form stopping or bending over. This lasts about 10-30 seconds and then passes. No HA or blurred vision.    Review of Systems  Constitutional: Negative.   Respiratory: Negative.   Cardiovascular: Negative.   Neurological: Positive for dizziness. Negative for tremors, seizures, syncope, facial asymmetry, speech difficulty, weakness, light-headedness, numbness and headaches.       Objective:   Physical Exam  Constitutional: She is oriented to person, place, and time. She appears well-developed and well-nourished. No distress.  HENT:  Head: Normocephalic and atraumatic.  Right Ear: External ear normal.  Left Ear: External ear normal.  Nose: Nose normal.  Mouth/Throat: Oropharynx is clear and moist.  Eyes: Conjunctivae and EOM are normal. Pupils are equal, round, and reactive to light.  Neck: Normal range of motion. Neck supple. No thyromegaly present.  Cardiovascular: Normal rate, regular rhythm, normal heart sounds and intact distal pulses.   Pulmonary/Chest: Effort normal and breath sounds normal.  Lymphadenopathy:    She has no cervical adenopathy.  Neurological: She is alert and oriented to person, place, and time. She has normal reflexes. No cranial nerve deficit. She exhibits normal muscle tone. Coordination normal.          Assessment & Plan:  This is vertigo. Rest and drink fluids. Use Meclizine prn

## 2015-02-07 ENCOUNTER — Telehealth: Payer: Self-pay | Admitting: Family Medicine

## 2015-02-07 NOTE — Telephone Encounter (Signed)
LVM concerning mammogram. 

## 2015-04-10 ENCOUNTER — Ambulatory Visit (INDEPENDENT_AMBULATORY_CARE_PROVIDER_SITE_OTHER): Payer: Managed Care, Other (non HMO) | Admitting: Family Medicine

## 2015-04-10 ENCOUNTER — Encounter: Payer: Self-pay | Admitting: Family Medicine

## 2015-04-10 VITALS — BP 136/73 | HR 78 | Temp 98.9°F | Ht 61.0 in | Wt 172.0 lb

## 2015-04-10 DIAGNOSIS — E119 Type 2 diabetes mellitus without complications: Secondary | ICD-10-CM | POA: Diagnosis not present

## 2015-04-10 DIAGNOSIS — M542 Cervicalgia: Secondary | ICD-10-CM | POA: Diagnosis not present

## 2015-04-10 MED ORDER — CYCLOBENZAPRINE HCL 10 MG PO TABS
10.0000 mg | ORAL_TABLET | Freq: Three times a day (TID) | ORAL | Status: DC | PRN
Start: 2015-04-10 — End: 2016-01-13

## 2015-04-10 MED ORDER — METHYLPREDNISOLONE 4 MG PO TBPK: ORAL_TABLET | ORAL | Status: DC

## 2015-04-10 NOTE — Progress Notes (Signed)
Pre visit review using our clinic review tool, if applicable. No additional management support is needed unless otherwise documented below in the visit note. 

## 2015-04-10 NOTE — Progress Notes (Signed)
   Subjective:    Patient ID: [name] [name], female    DOB: 1956-08-08, 59 y.o.   MRN: [id number]  HPI Here for several things. First she has had stiffness and pain in the left neck and upper back for 2 days. This started after she was hunched over for 30 minutes or so in her kitchen preparing a meal. When she went to stand up she felt a sharp pain in the neck and it has bothered her since then. She has tried Advil with no response. Also she has stopped taking all her meds for diabetes and BP for the past month, and she asks if she can stay off them. Her BP is stable but she has not been checking her glucoses. She has made big dietary changes, such as not drinking sodas or energy drinks.    Review of Systems  Constitutional: Negative.   Respiratory: Negative.   Cardiovascular: Negative.   Musculoskeletal: Positive for neck pain and neck stiffness.  Neurological: Negative.        Objective:   Physical Exam  Constitutional: She is oriented to person, place, and time. She appears well-developed and well-nourished. No distress.  Cardiovascular: Normal rate, regular rhythm, normal heart sounds and intact distal pulses.   Pulmonary/Chest: Effort normal and breath sounds normal.  Musculoskeletal:  She is tender in the posterior neck and there is a lot of spasm along the left upper trapezius, ROM of the neck is very limited by pain  Neurological: She is alert and oriented to person, place, and time.          Assessment & Plan:  She is having muscle spasms of the neck. Try heat, a Medrol dose pack, and Flexeril. We will check an A1c today to decide about her medications.

## 2015-04-11 LAB — HEMOGLOBIN A1C: HEMOGLOBIN A1C: 6.7 % — AB (ref 4.6–6.5)

## 2015-07-17 LAB — HM DIABETES EYE EXAM

## 2015-12-18 ENCOUNTER — Other Ambulatory Visit: Payer: Self-pay | Admitting: Family Medicine

## 2016-01-13 ENCOUNTER — Ambulatory Visit (INDEPENDENT_AMBULATORY_CARE_PROVIDER_SITE_OTHER): Payer: Managed Care, Other (non HMO) | Admitting: Family Medicine

## 2016-01-13 ENCOUNTER — Encounter: Payer: Self-pay | Admitting: Family Medicine

## 2016-01-13 VITALS — BP 138/73 | HR 84 | Temp 98.7°F | Ht 61.0 in | Wt 179.0 lb

## 2016-01-13 DIAGNOSIS — L03116 Cellulitis of left lower limb: Secondary | ICD-10-CM

## 2016-01-13 MED ORDER — DOXYCYCLINE HYCLATE 100 MG PO CAPS: 100.0000 mg | ORAL_CAPSULE | Freq: Two times a day (BID) | ORAL | Status: AC

## 2016-01-13 NOTE — Progress Notes (Signed)
Pre visit review using our clinic review tool, if applicable. No additional management support is needed unless otherwise documented below in the visit note. 

## 2016-01-13 NOTE — Progress Notes (Signed)
   Subjective:    Patient ID: [name] [name], female    DOB: 1956-07-05, 60 y.o.   MRN: [id number]  HPI Here for an apparent insect bite on the left lower leg that she first noticed about 4 days ago. Then yesterday it became painful and a zone of warmth and redness appeared around it. No fevers or headaches or body aches.   Review of Systems  Constitutional: Negative.   Respiratory: Negative.   Cardiovascular: Negative.   Musculoskeletal: Negative.   Skin: Positive for color change and wound.  Neurological: Negative.        Objective:   Physical Exam  Constitutional: She appears well-developed and well-nourished. No distress.  Cardiovascular: Normal rate, regular rhythm, normal heart sounds and intact distal pulses.   Pulmonary/Chest: Effort normal and breath sounds normal.  Skin:  The anterior left lower leg has a small punctate wound consistent with an insect bite. This is surrounded by warmth and some erythema          Assessment & Plan:  Cellulitis after an insect bite, treat with Doxycycline. Laurey Morale, MD

## 2016-02-10 ENCOUNTER — Ambulatory Visit (INDEPENDENT_AMBULATORY_CARE_PROVIDER_SITE_OTHER): Payer: Managed Care, Other (non HMO) | Admitting: Family Medicine

## 2016-02-10 ENCOUNTER — Encounter: Payer: Self-pay | Admitting: Family Medicine

## 2016-02-10 VITALS — BP 134/78 | HR 96 | Temp 98.0°F | Ht 61.0 in | Wt 177.0 lb

## 2016-02-10 DIAGNOSIS — J209 Acute bronchitis, unspecified: Secondary | ICD-10-CM | POA: Diagnosis not present

## 2016-02-10 MED ORDER — AZITHROMYCIN 250 MG PO TABS: ORAL_TABLET | ORAL | Status: DC

## 2016-02-10 MED ORDER — HYDROCODONE-HOMATROPINE 5-1.5 MG/5ML PO SYRP: 2.5000 mL | ORAL_SOLUTION | ORAL | Status: DC | PRN

## 2016-02-10 NOTE — Progress Notes (Signed)
Pre visit review using our clinic review tool, if applicable. No additional management support is needed unless otherwise documented below in the visit note. 

## 2016-02-12 NOTE — Progress Notes (Signed)
   Subjective:    Patient ID: [name] [name], female    DOB: 01-21-1956, 60 y.o.   MRN: [id number]  HPI Here for 5 days of chest congestion and coughing up yellow sputum. No fever. Using Robitussin and Mucinex.   Review of Systems  Constitutional: Negative.   HENT: Negative.   Eyes: Negative.   Respiratory: Positive for cough and chest tightness. Negative for shortness of breath and wheezing.        Objective:   Physical Exam  Constitutional: She appears well-developed and well-nourished.  HENT:  Right Ear: External ear normal.  Left Ear: External ear normal.  Nose: Nose normal.  Mouth/Throat: Oropharynx is clear and moist.  Eyes: Conjunctivae are normal.  Neck: No thyromegaly present.  Pulmonary/Chest: Effort normal. No respiratory distress. She has no wheezes. She has no rales.  Scattered rhonchi   Lymphadenopathy:    She has no cervical adenopathy.          Assessment & Plan:  Bronchitis, treat with a Zpack.  Laurey Morale, MD

## 2016-02-19 ENCOUNTER — Ambulatory Visit (INDEPENDENT_AMBULATORY_CARE_PROVIDER_SITE_OTHER): Payer: Managed Care, Other (non HMO) | Admitting: Family Medicine

## 2016-02-19 VITALS — BP 110/80 | HR 100 | Temp 98.1°F | Ht 61.0 in | Wt 176.0 lb

## 2016-02-19 DIAGNOSIS — L080 Pyoderma: Secondary | ICD-10-CM | POA: Diagnosis not present

## 2016-02-19 MED ORDER — MUPIROCIN CALCIUM 2 % EX CREA: 1.0000 "application " | TOPICAL_CREAM | Freq: Two times a day (BID) | CUTANEOUS | Status: DC

## 2016-02-19 MED ORDER — DOXYCYCLINE HYCLATE 100 MG PO CAPS: 100.0000 mg | ORAL_CAPSULE | Freq: Two times a day (BID) | ORAL | Status: DC

## 2016-02-19 NOTE — Patient Instructions (Signed)
Keep clean with soap and water Use topical antibiotic twice daily Leave off alcohol and peroxide. Touch base in two weeks if rash not better.

## 2016-02-19 NOTE — Progress Notes (Signed)
Pre visit review using our clinic review tool, if applicable. No additional management support is needed unless otherwise documented below in the visit note. 

## 2016-02-19 NOTE — Progress Notes (Signed)
   Subjective:    Patient ID: [name] [name], female    DOB: 02/11/1956, 60 y.o.   MRN: [id number]  HPI Patient seen with skin rash chin region. She was seen recently with acute bronchitis symptoms and treated with Zithromax and hydrocodone cough syrup. She developed some nausea and vomiting with the cough syrup. She did finish out antibiotic.  Onset of rash about 8 days ago. She's noticed some pustules and also some crusted drainage. No fevers or chills. Has been applying topical alcohol and peroxide. No history of known MRSA No other rashes reported.  Past Medical History  Diagnosis Date  . Ruptured ovarian cyst    Past Surgical History  Procedure Laterality Date  . Lumpectomy,benign, right breast      x2  . Cesarean section    . Colonoscopy  11-25-11    per Dr. Sharlett Iles, diverticulosis only, repeat in 10 yrs     reports that she has never smoked. She has never used smokeless tobacco. She reports that she drinks alcohol. She reports that she does not use illicit drugs. family history includes Cancer in her other. There is no history of Colon cancer, Esophageal cancer, Stomach cancer, or Rectal cancer. Allergies  Allergen Reactions  . Hydrocodone-Homatropine Nausea And Vomiting      Review of Systems  Constitutional: Negative for fever and chills.  Skin: Positive for rash.  Hematological: Negative for adenopathy.       Objective:   Physical Exam  Constitutional: She appears well-developed and well-nourished.  HENT:  Mouth/Throat: Oropharynx is clear and moist.  Neck: Neck supple.  Cardiovascular: Normal rate and regular rhythm.   Pulmonary/Chest: Effort normal and breath sounds normal. No respiratory distress. She has no wheezes. She has no rales.  Lymphadenopathy:    She has no cervical adenopathy.  Skin: Rash noted.  Patient has rash mid shin just below the mid lower lip. Covers an area about 1-1/2 X 1- 1/2 cm. She has a couple of pustules and some yellow crusted  drainage.          Assessment & Plan:  Skin rash involving the chin. This has somewhat of a pustular appearance-but no surrounding cellulitis changes. Doxycycline 100 mg twice a day for 10 days. Bactroban cream topically twice a day. Follow-up in 1-2 weeks if not clearing  Eulas Post MD [facility]

## 2016-05-29 ENCOUNTER — Ambulatory Visit (INDEPENDENT_AMBULATORY_CARE_PROVIDER_SITE_OTHER): Payer: Managed Care, Other (non HMO) | Admitting: Family Medicine

## 2016-05-29 ENCOUNTER — Encounter: Payer: Self-pay | Admitting: Family Medicine

## 2016-05-29 VITALS — BP 138/78 | HR 82 | Temp 98.8°F | Ht 61.0 in | Wt 174.0 lb

## 2016-05-29 DIAGNOSIS — M6740 Ganglion, unspecified site: Secondary | ICD-10-CM

## 2016-05-29 DIAGNOSIS — J019 Acute sinusitis, unspecified: Secondary | ICD-10-CM | POA: Diagnosis not present

## 2016-05-29 MED ORDER — AZITHROMYCIN 250 MG PO TABS: ORAL_TABLET | ORAL | 0 refills | Status: DC

## 2016-05-29 MED ORDER — BENZONATATE 200 MG PO CAPS: 200.0000 mg | ORAL_CAPSULE | Freq: Two times a day (BID) | ORAL | 1 refills | Status: DC | PRN

## 2016-05-29 NOTE — Progress Notes (Signed)
Pre visit review using our clinic review tool, if applicable. No additional management support is needed unless otherwise documented below in the visit note. 

## 2016-05-29 NOTE — Progress Notes (Signed)
   Subjective:    Patient ID: [name] [name], female    DOB: [birth year], 58 y.o.   MRN: [id number]  HPI Here for several issues. First she has had a slowly enlarging painful lump in the right hand that is now causing numbness in her fingers. Of note she had a similar lump removed from this hand 10 years ago while living in [place]. Second for the past 4 days she has  Had sinus pressure, PND, and is coughing up yellow sputum. No fever.   Review of Systems  Constitutional: Negative.   HENT: Positive for congestion, postnasal drip and sinus pressure. Negative for facial swelling and sore throat.   Eyes: Negative.   Respiratory: Positive for cough.   Neurological: Positive for numbness.       Objective:   Physical Exam  Constitutional: She appears well-developed and well-nourished.  HENT:  Right Ear: External ear normal.  Left Ear: External ear normal.  Nose: Nose normal.  Mouth/Throat: Oropharynx is clear and moist.  Eyes: Conjunctivae are normal.  Neck: No thyromegaly present.  Pulmonary/Chest: Effort normal and breath sounds normal. No respiratory distress. She has no wheezes. She has no rales.  Musculoskeletal:  The dorsal right hand has a small tender mobile cystic mass. Sensation to touch is decreased in the right 3rd and 4th fingers.   Lymphadenopathy:    She has no cervical adenopathy.          Assessment & Plan:  Treat the sinusitis with a Zpack. We will refer her to Hand Surgery for the ganglion cyst.  Laurey Morale, MD

## 2016-06-05 ENCOUNTER — Other Ambulatory Visit: Payer: Self-pay | Admitting: Family Medicine

## 2016-06-05 DIAGNOSIS — Z1231 Encounter for screening mammogram for malignant neoplasm of breast: Secondary | ICD-10-CM

## 2016-06-11 ENCOUNTER — Ambulatory Visit: Payer: Managed Care, Other (non HMO)

## 2016-06-12 ENCOUNTER — Other Ambulatory Visit (INDEPENDENT_AMBULATORY_CARE_PROVIDER_SITE_OTHER): Payer: Managed Care, Other (non HMO)

## 2016-06-12 ENCOUNTER — Ambulatory Visit
Admission: RE | Admit: 2016-06-12 | Discharge: 2016-06-12 | Disposition: A | Discharge status: Home / Self Care | Payer: Managed Care, Other (non HMO) | Source: Ambulatory Visit | Attending: Family Medicine | Admitting: Family Medicine

## 2016-06-12 DIAGNOSIS — Z0000 Encounter for general adult medical examination without abnormal findings: Secondary | ICD-10-CM | POA: Diagnosis not present

## 2016-06-12 DIAGNOSIS — Z1231 Encounter for screening mammogram for malignant neoplasm of breast: Secondary | ICD-10-CM

## 2016-06-12 LAB — POC URINALSYSI DIPSTICK (AUTOMATED)
Bilirubin, UA: NEGATIVE
Blood, UA: NEGATIVE
Glucose, UA: NEGATIVE
Ketones, UA: NEGATIVE
LEUKOCYTES UA: NEGATIVE
NITRITE UA: NEGATIVE
PROTEIN UA: NEGATIVE
Spec Grav, UA: 1.02
UROBILINOGEN UA: 0.2
pH, UA: 7

## 2016-06-12 LAB — MICROALBUMIN / CREATININE URINE RATIO
CREATININE, U: 110.1 mg/dL
MICROALB/CREAT RATIO: 0.6 mg/g (ref 0.0–30.0)

## 2016-06-12 LAB — CBC WITH DIFFERENTIAL/PLATELET
BASOS PCT: 0.7 % (ref 0.0–3.0)
Basophils Absolute: 0 10*3/uL (ref 0.0–0.1)
EOS PCT: 3.6 % (ref 0.0–5.0)
Eosinophils Absolute: 0.2 10*3/uL (ref 0.0–0.7)
HEMATOCRIT: 39.7 % (ref 36.0–46.0)
Hemoglobin: 13.4 g/dL (ref 12.0–15.0)
LYMPHS ABS: 2.1 10*3/uL (ref 0.7–4.0)
LYMPHS PCT: 35.7 % (ref 12.0–46.0)
MCHC: 33.8 g/dL (ref 30.0–36.0)
MCV: 81.5 fl (ref 78.0–100.0)
MONOS PCT: 6.1 % (ref 3.0–12.0)
Monocytes Absolute: 0.4 10*3/uL (ref 0.1–1.0)
NEUTROS ABS: 3.2 10*3/uL (ref 1.4–7.7)
NEUTROS PCT: 53.9 % (ref 43.0–77.0)
PLATELETS: 279 10*3/uL (ref 150.0–400.0)
RBC: 4.86 Mil/uL (ref 3.87–5.11)
RDW: 14.3 % (ref 11.5–15.5)
WBC: 6 10*3/uL (ref 4.0–10.5)

## 2016-06-12 LAB — BASIC METABOLIC PANEL
BUN: 12 mg/dL (ref 6–23)
CO2: 28 mEq/L (ref 19–32)
Calcium: 9.1 mg/dL (ref 8.4–10.5)
Chloride: 102 mEq/L (ref 96–112)
Creatinine, Ser: 0.57 mg/dL (ref 0.40–1.20)
GFR: 115.53 mL/min (ref >60.00)
Glucose, Bld: 116 mg/dL — ABNORMAL HIGH (ref 70–99)
Potassium: 3.7 mEq/L (ref 3.5–5.1)
Sodium: 138 mEq/L (ref 135–145)

## 2016-06-12 LAB — HEPATIC FUNCTION PANEL
ALBUMIN: 3.8 g/dL (ref 3.5–5.2)
ALK PHOS: 120 U/L — AB (ref 39–117)
ALT: 11 U/L (ref 0–35)
AST: 19 U/L (ref 0–37)
Bilirubin, Direct: 0 mg/dL (ref 0.0–0.3)
TOTAL PROTEIN: 7.7 g/dL (ref 6.0–8.3)
Total Bilirubin: 0.6 mg/dL (ref 0.2–1.2)

## 2016-06-12 LAB — LIPID PANEL
CHOLESTEROL: 191 mg/dL (ref 0–200)
HDL: 43.6 mg/dL (ref >39.00)
LDL Cholesterol: 123 mg/dL — ABNORMAL HIGH (ref 0–99)
NonHDL: 147.84
TRIGLYCERIDES: 126 mg/dL (ref 0.0–149.0)
Total CHOL/HDL Ratio: 4
VLDL: 25.2 mg/dL (ref 0.0–40.0)

## 2016-06-12 LAB — HEMOGLOBIN A1C: HEMOGLOBIN A1C: 7.5 % — AB (ref 4.6–6.5)

## 2016-06-12 LAB — TSH: TSH: 1.53 u[IU]/mL (ref 0.35–4.50)

## 2016-06-15 ENCOUNTER — Encounter: Payer: Self-pay | Admitting: Family Medicine

## 2016-06-15 LAB — HM DIABETES EYE EXAM

## 2016-06-19 ENCOUNTER — Encounter: Payer: Managed Care, Other (non HMO) | Admitting: Family Medicine

## 2016-11-21 ENCOUNTER — Emergency Department (HOSPITAL_COMMUNITY)
Admission: EM | Admit: 2016-11-21 | Discharge: 2016-11-21 | Disposition: A | Discharge status: Home / Self Care | Payer: Managed Care, Other (non HMO) | Attending: Emergency Medicine | Admitting: Emergency Medicine

## 2016-11-21 ENCOUNTER — Encounter (HOSPITAL_COMMUNITY): Payer: Self-pay | Admitting: Emergency Medicine

## 2016-11-21 ENCOUNTER — Emergency Department (HOSPITAL_COMMUNITY): Payer: Managed Care, Other (non HMO)

## 2016-11-21 ENCOUNTER — Emergency Department (HOSPITAL_BASED_OUTPATIENT_CLINIC_OR_DEPARTMENT_OTHER)
Admit: 2016-11-21 | Discharge: 2016-11-21 | Disposition: A | Discharge status: Home / Self Care | Payer: Managed Care, Other (non HMO) | Attending: Emergency Medicine | Admitting: Emergency Medicine

## 2016-11-21 DIAGNOSIS — M25562 Pain in left knee: Secondary | ICD-10-CM | POA: Diagnosis present

## 2016-11-21 DIAGNOSIS — M79609 Pain in unspecified limb: Secondary | ICD-10-CM

## 2016-11-21 DIAGNOSIS — E119 Type 2 diabetes mellitus without complications: Secondary | ICD-10-CM | POA: Diagnosis not present

## 2016-11-21 MED ORDER — NAPROXEN 500 MG PO TABS: 500.0000 mg | ORAL_TABLET | Freq: Three times a day (TID) | ORAL | 0 refills | Status: DC | PRN

## 2016-11-21 MED ORDER — NAPROXEN 500 MG PO TABS
500.0000 mg | ORAL_TABLET | Freq: Once | ORAL | Status: AC
  Administered 2016-11-21: 500 mg via ORAL
  Filled 2016-11-21: qty 1

## 2016-11-21 NOTE — ED Notes (Signed)
Patient transported to X-ray 

## 2016-11-21 NOTE — ED Triage Notes (Signed)
Patient here from home with complaint of right knee pain and swelling. Denies injury. Pain 10/10 behind knee. No blood thinners.

## 2016-11-21 NOTE — ED Provider Notes (Signed)
[facility] DEPT Provider Note   CSN: [id number] Arrival date & time: 11/21/16  1829     History   Chief Complaint Chief Complaint  Patient presents with  . Knee Pain    HPI [name] [name] is a 61 y.o. female.  The history is provided by the patient.  Knee Pain   This is a new problem. The current episode started 2 days ago. The problem occurs constantly. The problem has been gradually worsening. The pain is present in the left knee. The quality of the pain is described as aching. The pain is moderate. Associated symptoms include limited range of motion and stiffness. Pertinent negatives include no numbness and no tingling. The symptoms are aggravated by activity. She has tried OTC pain medications for the symptoms. The treatment provided no relief. There has been no history of extremity trauma. Family history is significant for no rheumatoid arthritis and no gout.  no recent immobilization, h/o DVT/PE, family history DVT/PE, exogenous hormones. No fall or strenuous activity.  Past Medical History:  Diagnosis Date  . Ruptured ovarian cyst     Patient Active Problem List   Diagnosis Date Noted  . Benign paroxysmal positional vertigo 11/02/2014  . Diabetes mellitus without complication ([place]) 93/71/6967  . Special screening for malignant neoplasms, colon 11/25/2011  . Diverticulosis of colon (without mention of hemorrhage) 11/25/2011  . URTICARIA 01/31/2010  . DE QUERVAIN'S TENOSYNOVITIS 01/31/2010  . ECZEMA 05/03/2009  . CANDIDIASIS OF VULVA AND VAGINA 06/27/2008  . ACUTE SINUSITIS, UNSPECIFIED 05/22/2008    Past Surgical History:  Procedure Laterality Date  . CESAREAN SECTION    . COLONOSCOPY  11-25-11   per Dr. Sharlett Iles, diverticulosis only, repeat in 10 yrs   . lumpectomy,benign, right breast     x2    OB History    No data available       Home Medications    Prior to Admission medications   Medication Sig Start Date End Date Taking? Authorizing Provider    azithromycin (ZITHROMAX Z-PAK) 250 MG tablet As directed 05/29/16   Laurey Morale, MD  benzonatate (TESSALON) 200 MG capsule Take 1 capsule (200 mg total) by mouth 2 (two) times daily as needed for cough. 05/29/16   Laurey Morale, MD  naproxen (NAPROSYN) 500 MG tablet Take 1 tablet (500 mg total) by mouth 3 (three) times daily with meals as needed for moderate pain. 11/21/16   Forde Dandy, MD    Family History Family History  Problem Relation Age of Onset  . Cancer Other     breast cancer  . Colon cancer Neg Hx   . Esophageal cancer Neg Hx   . Stomach cancer Neg Hx   . Rectal cancer Neg Hx     Social History Social History  Substance Use Topics  . Smoking status: Never Smoker  . Smokeless tobacco: Never Used  . Alcohol use 0.0 oz/week     Comment: maybe twice a month     Allergies   Hydrocodone-homatropine   Review of Systems Review of Systems  Constitutional: Negative for fever.  Respiratory: Negative for shortness of breath.   Cardiovascular: Negative for chest pain and leg swelling.  Gastrointestinal: Negative for vomiting.  Musculoskeletal: Positive for gait problem and stiffness.  Allergic/Immunologic: Negative for immunocompromised state.  Neurological: Negative for tingling, weakness and numbness.  Hematological: Does not bruise/bleed easily.  Psychiatric/Behavioral: Negative for confusion.  All other systems reviewed and are negative.    Physical Exam Updated Vital Signs  BP (!) 153/82 (BP Location: Left Arm)   Pulse 83   Temp 97.7 F (36.5 C) (Oral)   Resp 17   SpO2 100%   Physical Exam Physical Exam  Nursing note and vitals reviewed. Constitutional: Well developed, well nourished, non-toxic, and in no acute distress Head: Normocephalic and atraumatic.  Mouth/Throat: Oropharynx is clear and moist.  Neck: Normal range of motion. Neck supple.  Cardiovascular: Normal rate and regular rhythm.  +2 DP pulses bilaterally Pulmonary/Chest: Effort normal  and breath sounds normal.  Abdominal: Soft. There is no tenderness. There is no rebound and no guarding.  Musculoskeletal: Normal range of motion of right knee but with pain with ROM behind knee and over right calf. No leg edema.   Neurological: Alert, no facial droop, fluent speech, moves all extremities symmetrically, full strength ankle/dorsiflexion, full sensation to light touch in tac tin lower extremity Skin: Skin is warm and dry.  Psychiatric: Cooperative   ED Treatments / Results  Labs (all labs ordered are listed, but only abnormal results are displayed) Labs Reviewed - No data to display  EKG  EKG Interpretation None       Radiology Dg Knee Complete 4 Views Right  Result Date: 11/21/2016 CLINICAL DATA:  Swelling without trauma. EXAM: RIGHT KNEE - COMPLETE 4+ VIEW COMPARISON:  None. FINDINGS: An enthesophyte is associated with the superior patella. Minimal patellofemoral degenerative changes. No fracture or dislocation. No definitive joint effusion. No other acute abnormalities. IMPRESSION: 1. Minimal degenerative changes. No acute abnormalities or significant effusion. Electronically Signed   By: Dorise Bullion III M.D   On: 11/21/2016 10:23    Procedures Procedures (including critical care time)  Medications Ordered in ED Medications  naproxen (NAPROSYN) tablet 500 mg (500 mg Oral Given 11/21/16 1003)     Initial Impression / Assessment and Plan / ED Course  I have reviewed the triage vital signs and the nursing notes.  Pertinent labs & imaging results that were available during my care of the patient were reviewed by me and considered in my medical decision making (see chart for details).     Non-traumatic right knee pain for 2 days, initially felt a tweak over lateral knee but now pain behind the knee and over right calf. Differential includes knee strain, arthritis, baker's cyst. Less likely DVT, ligamentous injury based on history/exam. Soft mild soft tissue  swelling over w/o overlying skin changes. No concerns for septic arthritis/infection. Leg NV in tact.  X-ray of the knee visualized and does show mild arthritis but no other acute knee processes. Ultrasound of the right lower extremity does not reveal baker cyst, DVT or other acute processes. Discussed supportive care for this including anti-inflammatory medications, ice, elevation and compression dressing. She'll follow up with PCP.  Strict return and follow-up instructions reviewed. She expressed understanding of all discharge instructions and felt comfortable with the plan of care.   Final Clinical Impressions(s) / ED Diagnoses   Final diagnoses:  Acute pain of left knee    New Prescriptions New Prescriptions   NAPROXEN (NAPROSYN) 500 MG TABLET    Take 1 tablet (500 mg total) by mouth 3 (three) times daily with meals as needed for moderate pain.     Forde Dandy, MD 11/21/16 [phone]

## 2016-11-21 NOTE — ED Notes (Signed)
Discharge instructions, follow up care, and rx x1 reviewed with patient. Patient verbalized understanding. 

## 2016-11-21 NOTE — Progress Notes (Signed)
*  Preliminary Results* Right lower extremity venous duplex completed. Right lower extremity is negative for deep vein thrombosis. There is no evidence of right Baker's cyst.  11/21/2016 10:48 AM  Maudry Mayhew, BS, RVT, RDCS, RDMS

## 2016-11-21 NOTE — Discharge Instructions (Signed)
Your x-ray shows mild arthritis of the knee. Please continue to take naprosyn for pain. Do not take on an empty stomach. Keep leg elevated at rest, ice, as needed. Compression dressing for comfort. Please follow-up with yoru PCP for ongoing management.  Return for worsening symptoms, including fever, escalating pain, or any other symptoms concerning to you.

## 2016-12-07 ENCOUNTER — Encounter: Payer: Self-pay | Admitting: Family Medicine

## 2016-12-07 ENCOUNTER — Ambulatory Visit (INDEPENDENT_AMBULATORY_CARE_PROVIDER_SITE_OTHER): Payer: Managed Care, Other (non HMO) | Admitting: Family Medicine

## 2016-12-07 VITALS — BP 144/85 | HR 88 | Temp 98.8°F | Ht 61.0 in | Wt 175.0 lb

## 2016-12-07 DIAGNOSIS — M25561 Pain in right knee: Secondary | ICD-10-CM

## 2016-12-07 MED ORDER — METHYLPREDNISOLONE 4 MG PO TBPK: ORAL_TABLET | ORAL | 0 refills | Status: DC

## 2016-12-07 NOTE — Progress Notes (Signed)
Pre visit review using our clinic review tool, if applicable. No additional management support is needed unless otherwise documented below in the visit note. 

## 2016-12-07 NOTE — Patient Instructions (Signed)
WE NOW OFFER   [facility] FAST TRACK!!!  SAME DAY Appointments for ACUTE CARE  Such as: Sprains, Injuries, cuts, abrasions, rashes, muscle pain, joint pain, back pain Colds, flu, sore throats, headache, allergies, cough, fever  Ear pain, sinus and eye infections Abdominal pain, nausea, vomiting, diarrhea, upset stomach Animal/insect bites  3 Easy Ways to Schedule: Walk-In Scheduling Call in scheduling Mychart Sign-up: [email or web address]         

## 2016-12-07 NOTE — Progress Notes (Signed)
   Subjective:    Patient ID: [name] [name], female    DOB: 11-27-1955, 61 y.o.   MRN: [id number]  HPI Here for the sudden onset of pain in the right knee 2 and 1/2 weeks ago. No recent trauma. She suddenly had aching pains in the lateral and posterior knee, and these have persisted. Walking can be quite painful. The knee has swollen a bit but has never been red or warm to touch. She went to the ER on 11-21-16 and had a workup that included Xrays and a venous doppler of the leg. These revealed no fractures or Bakers cysts or thrombi. She does have some slight arthritis in the knee. She has tried Ibuprofen and naproxen with no relief. She has also had pain in  Some other joints, including the right shoulder. She is scheduled for a cortisone shot in this shoulder soon per [facility].    Review of Systems  Constitutional: Negative.   Respiratory: Negative.   Cardiovascular: Negative.   Musculoskeletal: Positive for arthralgias and joint swelling.       Objective:   Physical Exam  Constitutional: She is oriented to person, place, and time. She appears well-developed and well-nourished.  Cardiovascular: Normal rate, regular rhythm, normal heart sounds and intact distal pulses.   Pulmonary/Chest: Effort normal and breath sounds normal.  Musculoskeletal:  The right knee is mildly swollen, not red or warm. ROM is full. She is tender along the lateral joint space and in the popliteal fossa. No masses felt   Neurological: She is alert and oriented to person, place, and time.          Assessment & Plan:  Right knee pain, that seems to be part of a general arthritic process. Given a Medrol dose pack. Refer to Rheumatology. Alysia Penna, MD

## 2017-01-28 ENCOUNTER — Other Ambulatory Visit: Payer: Self-pay | Admitting: Family Medicine

## 2017-01-28 ENCOUNTER — Other Ambulatory Visit: Payer: Self-pay | Admitting: Physician Assistant

## 2017-01-28 DIAGNOSIS — M25461 Effusion, right knee: Secondary | ICD-10-CM

## 2017-02-05 ENCOUNTER — Ambulatory Visit
Admission: RE | Admit: 2017-02-05 | Discharge: 2017-02-05 | Disposition: A | Discharge status: Home / Self Care | Payer: Managed Care, Other (non HMO) | Source: Ambulatory Visit | Attending: Physician Assistant | Admitting: Physician Assistant

## 2017-02-05 DIAGNOSIS — M25461 Effusion, right knee: Secondary | ICD-10-CM

## 2017-02-05 MED ORDER — GADOBENATE DIMEGLUMINE 529 MG/ML IV SOLN
16.0000 mL | Freq: Once | INTRAVENOUS | Status: AC | PRN
  Administered 2017-02-05: 16 mL via INTRAVENOUS

## 2017-03-12 ENCOUNTER — Encounter: Payer: Self-pay | Admitting: Family Medicine

## 2017-03-12 ENCOUNTER — Ambulatory Visit (INDEPENDENT_AMBULATORY_CARE_PROVIDER_SITE_OTHER): Payer: Managed Care, Other (non HMO) | Admitting: Family Medicine

## 2017-03-12 VITALS — BP 138/81 | HR 75 | Temp 98.5°F | Ht 61.0 in | Wt 171.0 lb

## 2017-03-12 DIAGNOSIS — E119 Type 2 diabetes mellitus without complications: Secondary | ICD-10-CM | POA: Diagnosis not present

## 2017-03-12 DIAGNOSIS — G8929 Other chronic pain: Secondary | ICD-10-CM

## 2017-03-12 DIAGNOSIS — M25561 Pain in right knee: Secondary | ICD-10-CM

## 2017-03-12 LAB — HEPATIC FUNCTION PANEL
ALT: 8 U/L (ref 0–35)
AST: 14 U/L (ref 0–37)
Albumin: 4 g/dL (ref 3.5–5.2)
Alkaline Phosphatase: 98 U/L (ref 39–117)
BILIRUBIN DIRECT: 0.1 mg/dL (ref 0.0–0.3)
BILIRUBIN TOTAL: 0.5 mg/dL (ref 0.2–1.2)
Total Protein: 7.3 g/dL (ref 6.0–8.3)

## 2017-03-12 LAB — BASIC METABOLIC PANEL
BUN: 12 mg/dL (ref 6–23)
CALCIUM: 9.1 mg/dL (ref 8.4–10.5)
CO2: 31 mEq/L (ref 19–32)
Chloride: 101 mEq/L (ref 96–112)
Creatinine, Ser: 0.63 mg/dL (ref 0.40–1.20)
GFR: 102.67 mL/min (ref >60.00)
GLUCOSE: 113 mg/dL — AB (ref 70–99)
Potassium: 4.3 mEq/L (ref 3.5–5.1)
SODIUM: 139 meq/L (ref 135–145)

## 2017-03-12 LAB — LIPID PANEL
CHOL/HDL RATIO: 4
Cholesterol: 202 mg/dL — ABNORMAL HIGH (ref 0–200)
HDL: 53 mg/dL (ref >39.00)
LDL Cholesterol: 126 mg/dL — ABNORMAL HIGH (ref 0–99)
NonHDL: 148.58
Triglycerides: 113 mg/dL (ref 0.0–149.0)
VLDL: 22.6 mg/dL (ref 0.0–40.0)

## 2017-03-12 LAB — HEMOGLOBIN A1C: Hgb A1c MFr Bld: 8 % — ABNORMAL HIGH (ref 4.6–6.5)

## 2017-03-12 LAB — TSH: TSH: 1.62 u[IU]/mL (ref 0.35–4.50)

## 2017-03-12 NOTE — Patient Instructions (Signed)
WE NOW OFFER   [facility] FAST TRACK!!!  SAME DAY Appointments for ACUTE CARE  Such as: Sprains, Injuries, cuts, abrasions, rashes, muscle pain, joint pain, back pain Colds, flu, sore throats, headache, allergies, cough, fever  Ear pain, sinus and eye infections Abdominal pain, nausea, vomiting, diarrhea, upset stomach Animal/insect bites  3 Easy Ways to Schedule: Walk-In Scheduling Call in scheduling Mychart Sign-up: [email or web address]         

## 2017-03-12 NOTE — Progress Notes (Signed)
   Subjective:    Patient ID: [name] [name], female    DOB: [birth year], 61 y.o.   MRN: [id number]  HPI Here to follow up on right knee pain and on diabetes. She feels well in general. She saw Marella Chimes PA in Rheumatology and her workup was unrevealing. She was then sent to see Dr. Samella Parr and he gave her a cortisone shot. This has helped somewhat but not a lot. He said the next step would be to consider arthroscopic surgery if this doesn't help her. She asks to have an A1c done today.    Review of Systems  Constitutional: Negative.   Respiratory: Negative.   Cardiovascular: Negative.   Musculoskeletal: Positive for arthralgias.  Neurological: Negative.        Objective:   Physical Exam  Constitutional: She is oriented to person, place, and time. She appears well-developed and well-nourished.  Cardiovascular: Normal rate, regular rhythm, normal heart sounds and intact distal pulses.   Pulmonary/Chest: Effort normal and breath sounds normal. No respiratory distress. She has no wheezes. She has no rales. She exhibits no tenderness.  Musculoskeletal: She exhibits no edema.  Neurological: She is alert and oriented to person, place, and time.          Assessment & Plan:  For the diabetes we will get an A1c today. She will follow up with Dr. Pearline Cables and Dr. Nelva Bush for the knee pain.  Alysia Penna, MD

## 2017-03-16 ENCOUNTER — Other Ambulatory Visit: Payer: Self-pay | Admitting: Family Medicine

## 2017-03-16 MED ORDER — METFORMIN HCL 500 MG PO TABS: 500.0000 mg | ORAL_TABLET | Freq: Two times a day (BID) | ORAL | 5 refills | Status: DC

## 2017-04-30 ENCOUNTER — Ambulatory Visit (INDEPENDENT_AMBULATORY_CARE_PROVIDER_SITE_OTHER): Payer: Managed Care, Other (non HMO) | Admitting: Family Medicine

## 2017-04-30 ENCOUNTER — Encounter: Payer: Self-pay | Admitting: Family Medicine

## 2017-04-30 VITALS — BP 132/76 | HR 70 | Temp 98.3°F | Ht 61.0 in | Wt 169.0 lb

## 2017-04-30 DIAGNOSIS — M79671 Pain in right foot: Secondary | ICD-10-CM

## 2017-04-30 NOTE — Patient Instructions (Signed)
WE NOW OFFER   [facility] FAST TRACK!!!  SAME DAY Appointments for ACUTE CARE  Such as: Sprains, Injuries, cuts, abrasions, rashes, muscle pain, joint pain, back pain Colds, flu, sore throats, headache, allergies, cough, fever  Ear pain, sinus and eye infections Abdominal pain, nausea, vomiting, diarrhea, upset stomach Animal/insect bites  3 Easy Ways to Schedule: Walk-In Scheduling Call in scheduling Mychart Sign-up: [email or web address]         

## 2017-04-30 NOTE — Progress Notes (Signed)
   Subjective:    Patient ID: [name] [name], female    DOB: [birth year], 61 y.o.   MRN: [id number]  HPI Here for one month of pain in the arch of the right foot. No recent trauma.    Review of Systems  Constitutional: Negative.   Respiratory: Negative.   Cardiovascular: Negative.        Objective:   Physical Exam  Constitutional: She appears well-developed and well-nourished.  Cardiovascular: Normal rate, regular rhythm, normal heart sounds and intact distal pulses.   Pulmonary/Chest: Effort normal and breath sounds normal. No respiratory distress. She has no wheezes. She has no rales.  Musculoskeletal:  The arch of the right foot has several tender knots inside it, no swelling           Assessment & Plan:  Painful nodules in the right arch. Refer to Podiatry.  Alysia Penna, MD

## 2017-05-24 ENCOUNTER — Ambulatory Visit (INDEPENDENT_AMBULATORY_CARE_PROVIDER_SITE_OTHER): Payer: Managed Care, Other (non HMO) | Admitting: Podiatry

## 2017-05-24 ENCOUNTER — Encounter: Payer: Self-pay | Admitting: Podiatry

## 2017-05-24 ENCOUNTER — Ambulatory Visit (INDEPENDENT_AMBULATORY_CARE_PROVIDER_SITE_OTHER): Payer: Managed Care, Other (non HMO)

## 2017-05-24 VITALS — BP 139/75 | HR 72 | Resp 16

## 2017-05-24 DIAGNOSIS — M79671 Pain in right foot: Secondary | ICD-10-CM | POA: Diagnosis not present

## 2017-05-24 DIAGNOSIS — M722 Plantar fascial fibromatosis: Secondary | ICD-10-CM | POA: Diagnosis not present

## 2017-05-24 MED ORDER — DICLOFENAC SODIUM 75 MG PO TBEC: 75.0000 mg | DELAYED_RELEASE_TABLET | Freq: Two times a day (BID) | ORAL | 0 refills | Status: DC

## 2017-05-24 MED ORDER — BETAMETHASONE SOD PHOS & ACET 6 (3-3) MG/ML IJ SUSP: 3.0000 mg | Freq: Once | INTRAMUSCULAR | Status: DC

## 2017-05-24 NOTE — Progress Notes (Signed)
   Subjective:    Patient ID: [name] [name], female    DOB: 03/18/1956, 61 y.o.   MRN: [id number]  HPI    Review of Systems  All other systems reviewed and are negative.      Objective:   Physical Exam        Assessment & Plan:

## 2017-05-26 NOTE — Progress Notes (Signed)
   Subjective: Patient presents today for pain and tenderness in the feet bilaterally. Patient states the foot pain has been hurting for several weeks now. Patient states that it hurts in the mornings with the first steps out of bed. She states there is a knot present to the medial side of the right foot. She also reports pain to the medial border of the right great toe that began about two weeks ago. Patient presents today for further treatment and evaluation.  Past Medical History:  Diagnosis Date  . Ruptured ovarian cyst      Objective: Physical Exam General: The patient is alert and oriented x3 in no acute distress.  Dermatology: Skin is warm, dry and supple bilateral lower extremities. Negative for open lesions or macerations bilateral.   Vascular: Dorsalis Pedis and Posterior Tibial pulses palpable bilateral.  Capillary fill time is immediate to all digits.  Neurological: Epicritic and protective threshold intact bilateral.   Musculoskeletal: Tenderness to palpation at the medial calcaneal tubercale and through the insertion of the plantar fascia of the bilateral feet. All other joints range of motion within normal limits bilateral. Strength 5/5 in all groups bilateral.   Radiographic exam: Normal osseous mineralization. Joint spaces preserved. No fracture/dislocation/boney destruction. Calcaneal spur present with mild thickening of plantar fascia bilateral. No other soft tissue abnormalities or radiopaque foreign bodies.   Assessment: 1. plantar fasciitis bilateral feet 2. Plantar fibroma right  Plan of Care:  1. Patient evaluated. Xrays reviewed.   2. Injection of 0.5cc Celestone soluspan injected into the bilateral heels.  3. Injection of 0.5 mLs Celestone Soluspan injected into the fibroma of the right foot. 4. Rx for Diclofenac 75mg  PO BID ordered for patient. 5. Appt with Liliane Channel for custom molded orthotics.  6. Return to clinic in 4 weeks.    Edrick Kins, DPM [facility]  Dr. Edrick Kins, DPM    [address], [place] [address]                Office [phone]  Fax [phone]

## 2017-06-14 ENCOUNTER — Encounter: Payer: Self-pay | Admitting: Family Medicine

## 2017-06-14 ENCOUNTER — Ambulatory Visit (INDEPENDENT_AMBULATORY_CARE_PROVIDER_SITE_OTHER): Payer: Managed Care, Other (non HMO) | Admitting: Family Medicine

## 2017-06-14 VITALS — BP 139/79 | HR 84 | Temp 98.7°F | Ht 61.0 in | Wt 167.0 lb

## 2017-06-14 DIAGNOSIS — R42 Dizziness and giddiness: Secondary | ICD-10-CM | POA: Diagnosis not present

## 2017-06-14 DIAGNOSIS — J018 Other acute sinusitis: Secondary | ICD-10-CM | POA: Diagnosis not present

## 2017-06-14 MED ORDER — ONDANSETRON 8 MG PO TBDP: 8.0000 mg | ORAL_TABLET | Freq: Three times a day (TID) | ORAL | 0 refills | Status: DC | PRN

## 2017-06-14 MED ORDER — AZITHROMYCIN 250 MG PO TABS: ORAL_TABLET | ORAL | 0 refills | Status: DC

## 2017-06-14 MED ORDER — MECLIZINE HCL 25 MG PO TABS: 25.0000 mg | ORAL_TABLET | ORAL | 0 refills | Status: DC | PRN

## 2017-06-14 NOTE — Progress Notes (Signed)
   Subjective:    Patient ID: [name] [name], female    DOB: 06-24-1956, 61 y.o.   MRN: [id number]  HPI Here for the onset this am of a headache, sinus pressure, PND, dizziness and nausea without vomiting. No fever.    Review of Systems  Constitutional: Negative.   HENT: Positive for congestion, postnasal drip, sinus pain and sinus pressure. Negative for ear pain and sore throat.   Eyes: Negative.   Respiratory: Negative.   Gastrointestinal: Positive for nausea. Negative for abdominal distention, abdominal pain, anal bleeding, blood in stool, constipation, diarrhea, rectal pain and vomiting.  Neurological: Positive for dizziness and headaches.       Objective:   Physical Exam  Constitutional: She is oriented to person, place, and time. She appears well-developed and well-nourished. No distress.  HENT:  Right Ear: External ear normal.  Left Ear: External ear normal.  Nose: Nose normal.  Mouth/Throat: Oropharynx is clear and moist.  Eyes: Conjunctivae are normal.  Neck: No thyromegaly present.  Cardiovascular: Normal rate, regular rhythm, normal heart sounds and intact distal pulses.   Pulmonary/Chest: Effort normal and breath sounds normal.  Lymphadenopathy:    She has no cervical adenopathy.  Neurological: She is alert and oriented to person, place, and time.          Assessment & Plan:  Vertigo from an early sinusitis. Treat with a Zpack. Drink fluids. Add Zofran and Meclizine prn.  Alysia Penna, MD

## 2017-06-14 NOTE — Patient Instructions (Signed)
WE NOW OFFER   [facility] FAST TRACK!!!  SAME DAY Appointments for ACUTE CARE  Such as: Sprains, Injuries, cuts, abrasions, rashes, muscle pain, joint pain, back pain Colds, flu, sore throats, headache, allergies, cough, fever  Ear pain, sinus and eye infections Abdominal pain, nausea, vomiting, diarrhea, upset stomach Animal/insect bites  3 Easy Ways to Schedule: Walk-In Scheduling Call in scheduling Mychart Sign-up: [email or web address]         

## 2017-06-21 ENCOUNTER — Ambulatory Visit (INDEPENDENT_AMBULATORY_CARE_PROVIDER_SITE_OTHER): Payer: Self-pay | Admitting: Orthotics

## 2017-06-21 DIAGNOSIS — M722 Plantar fascial fibromatosis: Secondary | ICD-10-CM | POA: Diagnosis not present

## 2017-06-21 NOTE — Progress Notes (Signed)

## 2017-06-28 LAB — HM DIABETES EYE EXAM

## 2017-07-09 ENCOUNTER — Ambulatory Visit (INDEPENDENT_AMBULATORY_CARE_PROVIDER_SITE_OTHER): Payer: Managed Care, Other (non HMO) | Admitting: Family Medicine

## 2017-07-09 ENCOUNTER — Encounter: Payer: Self-pay | Admitting: Family Medicine

## 2017-07-09 VITALS — BP 120/62 | HR 87 | Temp 98.3°F | Ht 61.0 in | Wt 170.9 lb

## 2017-07-09 DIAGNOSIS — R350 Frequency of micturition: Secondary | ICD-10-CM

## 2017-07-09 DIAGNOSIS — R319 Hematuria, unspecified: Secondary | ICD-10-CM | POA: Diagnosis not present

## 2017-07-09 DIAGNOSIS — Z6832 Body mass index (BMI) 32.0-32.9, adult: Secondary | ICD-10-CM | POA: Diagnosis not present

## 2017-07-09 LAB — POCT URINALYSIS DIPSTICK
BILIRUBIN UA: NEGATIVE
GLUCOSE UA: NEGATIVE
KETONES UA: NEGATIVE
Leukocytes, UA: NEGATIVE
NITRITE UA: NEGATIVE
PH UA: 7 (ref 5.0–8.0)
Protein, UA: NEGATIVE
SPEC GRAV UA: 1.015 (ref 1.010–1.025)
Urobilinogen, UA: 0.2 E.U./dL

## 2017-07-09 LAB — URINALYSIS, MICROSCOPIC ONLY

## 2017-07-09 MED ORDER — NITROFURANTOIN MONOHYD MACRO 100 MG PO CAPS: 100.0000 mg | ORAL_CAPSULE | Freq: Two times a day (BID) | ORAL | 0 refills | Status: DC

## 2017-07-09 NOTE — Patient Instructions (Addendum)
BEFORE YOU LEAVE: -follow up: schedule follow up with Dr. Sarajane Jews in the next 4-6 weeks to address your diabetes  Take the antibiotic for the urinary symptoms. We are doing further testing to see if there if for sure an infection or not. You may need further workup if no infection and blood is confirmed. Seek care promptly if worsening, recurrent bleeding or other concerns.  Advise a healthy low sugar diet - see below and healthy lifestyle in general to help improve you diabetes. You also need to follow up with your doctor about this to recheck and discuss medications to help as well.   We recommend the following healthy lifestyle for LIFE: 1) Small portions.   Tip: eat off of a salad plate instead of a dinner plate.  Tip: It is ok to feel hungry after a meal - that likely means you ate an appropriate portion.  Tip: if you need more or a snack choose fruits, veggies and/or a handful of nuts or seeds.  2) Eat a healthy clean diet.   TRY TO EAT: -at least 5-7 servings of low sugar vegetables per day (not corn, potatoes or bananas.) -berries are the best choice if you wish to eat fruit.   -lean meets (fish, chicken or [place] breasts) -vegan proteins for some meals - beans or tofu, whole grains, nuts and seeds -Replace bad fats with good fats - good fats include: fish, nuts and seeds, canola oil, olive oil -small amounts of low fat or non fat dairy -small amounts of100 % whole grains - check the lables  AVOID: -SUGAR, sweets, anything with added sugar, corn syrup or sweeteners -if you must have a sweetener, small amounts of stevia may be best -sweetened beverages -simple starches (rice, bread, potatoes, pasta, chips, etc - small amounts of 100% whole grains are ok) -red meat, pork, butter -fried foods, fast food, processed food, excessive dairy, eggs and coconut.  3)Get at least 150 minutes of sweaty aerobic exercise per week.  4)Reduce stress - consider counseling, meditation and  relaxation to balance other aspects of your life.

## 2017-07-09 NOTE — Progress Notes (Signed)
HPI:  Acute visit for Dysuria: -x2 days -frequency, urgency, dysuria and saw streak of blood in urine this morning -reports diabetes not controlled - initially doesn't want to take more medications and doesn't want to eat healthier, does not have follow up with PCP but reports he told her to follow up every 3 months -no fevers, vomiting, diarrhea, vaginal symptoms, flank pain -reports hx of UTI  ROS: See pertinent positives and negatives per HPI.  Past Medical History:  Diagnosis Date  . Ruptured ovarian cyst     Past Surgical History:  Procedure Laterality Date  . CESAREAN SECTION    . COLONOSCOPY  11-25-11   per Dr. Sharlett Iles, diverticulosis only, repeat in 10 yrs   . lumpectomy,benign, right breast     x2    Family History  Problem Relation Age of Onset  . Cancer Other        breast cancer  . Colon cancer Neg Hx   . Esophageal cancer Neg Hx   . Stomach cancer Neg Hx   . Rectal cancer Neg Hx     Social History   Social History  . Marital status: Married    Spouse name: N/A  . Number of children: N/A  . Years of education: N/A   Social History Main Topics  . Smoking status: Never Smoker  . Smokeless tobacco: Never Used  . Alcohol use 0.0 oz/week     Comment: maybe twice a month  . Drug use: No  . Sexual activity: Not Asked   Other Topics Concern  . None   Social History Narrative   Married   Never Smoker   Alcohol use-yes   Drug use-no           Current Outpatient Prescriptions:  .  azithromycin (ZITHROMAX) 250 MG tablet, As directed, Disp: 6 tablet, Rfl: 0 .  diclofenac (VOLTAREN) 75 MG EC tablet, Take 1 tablet (75 mg total) by mouth 2 (two) times daily., Disp: 60 tablet, Rfl: 0 .  meclizine (ANTIVERT) 25 MG tablet, Take 1 tablet (25 mg total) by mouth every 4 (four) hours as needed for dizziness., Disp: 60 tablet, Rfl: 0 .  metFORMIN (GLUCOPHAGE) 500 MG tablet, Take 1 tablet (500 mg total) by mouth 2 (two) times daily with a meal., Disp: 60  tablet, Rfl: 5 .  ondansetron (ZOFRAN ODT) 8 MG disintegrating tablet, Take 1 tablet (8 mg total) by mouth every 8 (eight) hours as needed for nausea or vomiting., Disp: 30 tablet, Rfl: 0  Current Facility-Administered Medications:  .  betamethasone acetate-betamethasone sodium phosphate (CELESTONE) injection 3 mg, 3 mg, Intramuscular, Once, Evans, Ruby Cola M, DPM  EXAM:  Vitals:   07/09/17 1251  BP: 120/62  Pulse: 87  Temp: 98.3 F (36.8 C)    Body mass index is 32.29 kg/m.  GENERAL: vitals reviewed and listed above, alert, oriented, appears well hydrated and in no acute distress  HEENT: atraumatic, conjunttiva clear, no obvious abnormalities on inspection of external nose and ears  NECK: no obvious masses on inspection  LUNGS: clear to auscultation bilaterally, no wheezes, rales or rhonchi, good air movement  CV: HRRR, no peripheral edema  ABD: BS+, soft, NTTP, no CVA ttp  MS: moves all extremities without noticeable abnormality  PSYCH: pleasant and cooperative, no obvious depression or anxiety  ASSESSMENT AND PLAN:  Discussed the following assessment and plan:  Urinary frequency - Plan: POC Urinalysis Dipstick  Hematuria, unspecified type  -udip, culture/micro to further eval - tx empirically after  discussion given hematuria and symptoms c/w UTI, return and emergency precautions -advised will need further eval if blood and no infection on pending tests -discussed treatment options for diabetes and importance of good control of diabetes, increased risks with high blood sugar -advised healthy lifestyle per handout and to schedule follow up with PCP -Patient advised to return or notify a doctor immediately if symptoms worsen or persist or new concerns arise.  Patient Instructions  BEFORE YOU LEAVE: -follow up: schedule follow up with Dr. Sarajane Jews in the next 4-6 weeks to address your diabetes  Advise a healthy low sugar diet - see below and healthy lifestyle in general to  help improve you diabetes. You also need to follow up with your doctor about this to recheck and discuss medications to help as well.   We recommend the following healthy lifestyle for LIFE: 1) Small portions.   Tip: eat off of a salad plate instead of a dinner plate.  Tip: It is ok to feel hungry after a meal - that likely means you ate an appropriate portion.  Tip: if you need more or a snack choose fruits, veggies and/or a handful of nuts or seeds.  2) Eat a healthy clean diet.   TRY TO EAT: -at least 5-7 servings of low sugar vegetables per day (not corn, potatoes or bananas.) -berries are the best choice if you wish to eat fruit.   -lean meets (fish, chicken or [place] breasts) -vegan proteins for some meals - beans or tofu, whole grains, nuts and seeds -Replace bad fats with good fats - good fats include: fish, nuts and seeds, canola oil, olive oil -small amounts of low fat or non fat dairy -small amounts of100 % whole grains - check the lables  AVOID: -SUGAR, sweets, anything with added sugar, corn syrup or sweeteners -if you must have a sweetener, small amounts of stevia may be best -sweetened beverages -simple starches (rice, bread, potatoes, pasta, chips, etc - small amounts of 100% whole grains are ok) -red meat, pork, butter -fried foods, fast food, processed food, excessive dairy, eggs and coconut.  3)Get at least 150 minutes of sweaty aerobic exercise per week.  4)Reduce stress - consider counseling, meditation and relaxation to balance other aspects of your life.    Colin Benton R., DO

## 2017-07-10 LAB — URINE CULTURE
MICRO NUMBER:: 81233028
RESULT: NO GROWTH
SPECIMEN QUALITY:: ADEQUATE

## 2017-07-12 ENCOUNTER — Ambulatory Visit: Payer: Managed Care, Other (non HMO) | Admitting: Orthotics

## 2017-07-12 DIAGNOSIS — M722 Plantar fascial fibromatosis: Secondary | ICD-10-CM

## 2017-07-12 NOTE — Progress Notes (Signed)
Patient came in today to pick up custom made foot orthotics.  The goals were accomplished and the patient reported no dissatisfaction with said orthotics.  Patient was advised of breakin period and how to report any issues. 

## 2017-07-23 ENCOUNTER — Encounter: Payer: Self-pay | Admitting: Family Medicine

## 2017-07-23 ENCOUNTER — Ambulatory Visit: Payer: Managed Care, Other (non HMO) | Admitting: Family Medicine

## 2017-07-23 VITALS — BP 132/80 | HR 75 | Temp 98.3°F | Ht 61.0 in | Wt 167.0 lb

## 2017-07-23 DIAGNOSIS — E119 Type 2 diabetes mellitus without complications: Secondary | ICD-10-CM | POA: Diagnosis not present

## 2017-07-23 DIAGNOSIS — N390 Urinary tract infection, site not specified: Secondary | ICD-10-CM | POA: Diagnosis not present

## 2017-07-23 DIAGNOSIS — Z0000 Encounter for general adult medical examination without abnormal findings: Secondary | ICD-10-CM

## 2017-07-23 LAB — CBC WITH DIFFERENTIAL/PLATELET
BASOS ABS: 0.1 10*3/uL (ref 0.0–0.1)
Basophils Relative: 0.7 % (ref 0.0–3.0)
EOS ABS: 0.1 10*3/uL (ref 0.0–0.7)
Eosinophils Relative: 1.8 % (ref 0.0–5.0)
HEMATOCRIT: 39.2 % (ref 36.0–46.0)
Hemoglobin: 12.9 g/dL (ref 12.0–15.0)
LYMPHS PCT: 27.8 % (ref 12.0–46.0)
Lymphs Abs: 2 10*3/uL (ref 0.7–4.0)
MCHC: 32.9 g/dL (ref 30.0–36.0)
MCV: 86.5 fl (ref 78.0–100.0)
MONOS PCT: 5.9 % (ref 3.0–12.0)
Monocytes Absolute: 0.4 10*3/uL (ref 0.1–1.0)
Neutro Abs: 4.6 10*3/uL (ref 1.4–7.7)
Neutrophils Relative %: 63.8 % (ref 43.0–77.0)
PLATELETS: 330 10*3/uL (ref 150.0–400.0)
RBC: 4.53 Mil/uL (ref 3.87–5.11)
RDW: 14.6 % (ref 11.5–15.5)
WBC: 7.2 10*3/uL (ref 4.0–10.5)

## 2017-07-23 LAB — HEPATIC FUNCTION PANEL
ALBUMIN: 3.9 g/dL (ref 3.5–5.2)
ALT: 10 U/L (ref 0–35)
AST: 19 U/L (ref 0–37)
Alkaline Phosphatase: 82 U/L (ref 39–117)
BILIRUBIN TOTAL: 0.6 mg/dL (ref 0.2–1.2)
Bilirubin, Direct: 0.1 mg/dL (ref 0.0–0.3)
Total Protein: 6.8 g/dL (ref 6.0–8.3)

## 2017-07-23 LAB — POC URINALSYSI DIPSTICK (AUTOMATED)
BILIRUBIN UA: NEGATIVE
Glucose, UA: NEGATIVE
Ketones, UA: NEGATIVE
LEUKOCYTES UA: NEGATIVE
NITRITE UA: NEGATIVE
PH UA: 7 (ref 5.0–8.0)
PROTEIN UA: NEGATIVE
RBC UA: NEGATIVE
Spec Grav, UA: 1.015 (ref 1.010–1.025)
UROBILINOGEN UA: 0.2 U/dL

## 2017-07-23 LAB — LIPID PANEL
CHOL/HDL RATIO: 4
Cholesterol: 198 mg/dL (ref 0–200)
HDL: 47.3 mg/dL (ref >39.00)
LDL CALC: 125 mg/dL — AB (ref 0–99)
NonHDL: 150.44
TRIGLYCERIDES: 125 mg/dL (ref 0.0–149.0)
VLDL: 25 mg/dL (ref 0.0–40.0)

## 2017-07-23 LAB — BASIC METABOLIC PANEL
BUN: 12 mg/dL (ref 6–23)
CALCIUM: 9.1 mg/dL (ref 8.4–10.5)
CO2: 28 mEq/L (ref 19–32)
Chloride: 103 mEq/L (ref 96–112)
Creatinine, Ser: 0.53 mg/dL (ref 0.40–1.20)
GFR: 125.17 mL/min (ref >60.00)
Glucose, Bld: 108 mg/dL — ABNORMAL HIGH (ref 70–99)
POTASSIUM: 4 meq/L (ref 3.5–5.1)
Sodium: 140 mEq/L (ref 135–145)

## 2017-07-23 LAB — TSH: TSH: 1.08 u[IU]/mL (ref 0.35–4.50)

## 2017-07-23 LAB — HEMOGLOBIN A1C: Hgb A1c MFr Bld: 7.2 % — ABNORMAL HIGH (ref 4.6–6.5)

## 2017-07-23 MED ORDER — DICLOFENAC SODIUM 75 MG PO TBEC: 75.0000 mg | DELAYED_RELEASE_TABLET | Freq: Two times a day (BID) | ORAL | 3 refills | Status: DC

## 2017-07-23 MED ORDER — METFORMIN HCL 500 MG PO TABS: 500.0000 mg | ORAL_TABLET | Freq: Two times a day (BID) | ORAL | 3 refills | Status: DC

## 2017-07-23 MED ORDER — CIPROFLOXACIN HCL 500 MG PO TABS: 500.0000 mg | ORAL_TABLET | Freq: Two times a day (BID) | ORAL | 0 refills | Status: DC

## 2017-07-23 NOTE — Progress Notes (Signed)
   Subjective:    Patient ID: [name] [name], female    DOB: Dec 15, 1955, 61 y.o.   MRN: [id number]  HPI Here for a wellness exam. She was here on 07-09-17 with urinary urgency, frequency, and burning, and she has oocasionally seen some blood in her urine. She was started on Macrobid, but she was told to stop taking it after her urine culture returned as no growth. However these symptoms have continued since then. No back pain or fever. She still has diffuse arthritis pains. Diclofenac helps but she is considering trying acupuncture as well.    Review of Systems  Constitutional: Negative.   HENT: Negative.   Eyes: Negative.   Respiratory: Negative.   Cardiovascular: Negative.   Gastrointestinal: Negative.   Genitourinary: Positive for dysuria, frequency and urgency. Negative for decreased urine volume, difficulty urinating, dyspareunia, enuresis, flank pain, hematuria and pelvic pain.  Musculoskeletal: Positive for arthralgias. Negative for back pain, gait problem, joint swelling, myalgias, neck pain and neck stiffness.  Skin: Negative.   Neurological: Negative.   Psychiatric/Behavioral: Negative.        Objective:   Physical Exam  Constitutional: She is oriented to person, place, and time. She appears well-developed and well-nourished. No distress.  HENT:  Head: Normocephalic and atraumatic.  Right Ear: External ear normal.  Left Ear: External ear normal.  Nose: Nose normal.  Mouth/Throat: Oropharynx is clear and moist. No oropharyngeal exudate.  Eyes: Conjunctivae and EOM are normal. Pupils are equal, round, and reactive to light. No scleral icterus.  Neck: Normal range of motion. Neck supple. No JVD present. No thyromegaly present.  Cardiovascular: Normal rate, regular rhythm, normal heart sounds and intact distal pulses. Exam reveals no gallop and no friction rub.  No murmur heard. Pulmonary/Chest: Effort normal and breath sounds normal. No respiratory distress. She has no wheezes.  She has no rales. She exhibits no tenderness.  Abdominal: Soft. Bowel sounds are normal. She exhibits no distension and no mass. There is no rebound and no guarding.  Mildly tender over the lower abdomen   Musculoskeletal: Normal range of motion. She exhibits no edema or tenderness.  Lymphadenopathy:    She has no cervical adenopathy.  Neurological: She is alert and oriented to person, place, and time. She has normal reflexes. No cranial nerve deficit. She exhibits normal muscle tone. Coordination normal.  Skin: Skin is warm and dry. No rash noted. No erythema.  Psychiatric: She has a normal mood and affect. Her behavior is normal. Judgment and thought content normal.          Assessment & Plan:  Well exam. We discussed diet and exercise. Get fasting labs. We will refer her to Nutrition to learn more about diabetes care. Treat the UTI with Cipro.  Alysia Penna, MD

## 2017-07-24 LAB — URINE CULTURE
MICRO NUMBER:: 81295741
RESULT: NO GROWTH
SPECIMEN QUALITY:: ADEQUATE

## 2017-08-13 ENCOUNTER — Ambulatory Visit: Payer: Managed Care, Other (non HMO) | Admitting: Family Medicine

## 2017-08-23 ENCOUNTER — Encounter: Payer: Self-pay | Admitting: Registered"

## 2017-08-23 ENCOUNTER — Encounter: Payer: Managed Care, Other (non HMO) | Attending: Family Medicine | Admitting: Registered"

## 2017-08-23 DIAGNOSIS — E119 Type 2 diabetes mellitus without complications: Secondary | ICD-10-CM

## 2017-08-23 DIAGNOSIS — Z713 Dietary counseling and surveillance: Secondary | ICD-10-CM | POA: Diagnosis not present

## 2017-08-23 NOTE — Progress Notes (Signed)
Diabetes Self-Management Education  Visit Type: First/Initial  Appt. Start Time: 1445 Appt. End Time: 2947  08/23/2017  Ms. [name] [name], identified by name and date of birth, is a 61 y.o. female with a diagnosis of Diabetes: Type 2.   ASSESSMENT Per chart A1c is 7.2%, down from 8.0% 03/12/17. Pt sates since July she had started walking daily. Pt states she has cut out coffee, red bull, coke. Patient states she has started eating more nuts and drinks vegetable smoothie, but states she eats too many tortillas.  Patient was able to communicate in [place], but preferred written materials in Spanish.   Diabetes Self-Management Education - 08/23/17 1405      Visit Information   Visit Type  First/Initial      Initial Visit   Diabetes Type  Type 2    Are you currently following a meal plan?  No    Are you taking your medications as prescribed?  Yes metfomin 500 mg BID    Date Diagnosed  3 yrs      Health Coping   How would you rate your overall health?  Good      Psychosocial Assessment   Patient Belief/Attitude about Diabetes  Afraid      Complications   Last HgB A1C per patient/outside source  7.2 % per chart 07/23/17    How often do you check your blood sugar?  0 times/day (not testing)    Have you had a dilated eye exam in the past 12 months?  Yes    Have you had a dental exam in the past 12 months?  Yes    Are you checking your feet?  Yes    How many days per week are you checking your feet?  1      Dietary Intake   Breakfast  vegetable smoothie with banana and nuts    Snack (morning)  cottage, fruit OR rice & milk    Lunch  1x week salad OR tacos, rice 1/3 c, meat, salsa    Snack (afternoon)  none    Dinner  tortilla, beans, meat, salsa OR meat, potatoes     Snack (evening)  rice, milk, sugar OR fruit OR nuts    Beverage(s)  coconut water, or smoothie      Exercise   Exercise Type  Light (walking / raking leaves)    How many days per week to you exercise?  7    How  many minutes per day do you exercise?  40    Total minutes per week of exercise  280      Patient Education   Previous Diabetes Education  No    Disease state   Definition of diabetes, type 1 and 2, and the diagnosis of diabetes    Nutrition management   Role of diet in the treatment of diabetes and the relationship between the three main macronutrients and blood glucose level;Food label reading, portion sizes and measuring food.    Physical activity and exercise   Role of exercise on diabetes management, blood pressure control and cardiac health.    Monitoring  Identified appropriate SMBG and/or A1C goals.      Individualized Goals (developed by patient)   Nutrition  General guidelines for healthy choices and portions discussed    Physical Activity  Exercise 5-7 days per week      Outcomes   Expected Outcomes  Demonstrated interest in learning. Expect positive outcomes    Future DMSE  4-6  wks    Program Status  Completed     Individualized Plan for Diabetes Self-Management Training:   Learning Objective:  Patient will have a greater understanding of diabetes self-management. Patient education plan is to attend individual and/or group sessions per assessed needs and concerns.  Patient Instructions  Consider having 2 tortillas instead of 3 at lunch time, include more vegetables.  Consider eating fish 2-3 week Continue eating regular meals and balance the carbohydrates with protein. Continue drinking plenty of water and non-sweetened beverages You can get a meter at a drug store if you are interested if your blood sugar is getting lower. Continue exercising regularly and getting 7-8 hours of sleep each night  Expected Outcomes:  Demonstrated interest in learning. Expect positive outcomes  Education material provided: Living Well with Diabetes, A1C conversion sheet, My Plate and Snack sheet  If problems or questions, patient to contact team via:  Phone  Future DSME appointment: 4-6  wks

## 2017-08-23 NOTE — Patient Instructions (Addendum)
Consider having 2 tortillas instead of 3 at lunch time, include more vegetables.  Consider eating fish 2-3 week Continue eating regular meals and balance the carbohydrates with protein. Continue drinking plenty of water and non-sweetened beverages You can get a meter at a drug store if you are interested if your blood sugar is getting lower. Continue exercising regularly and getting 7-8 hours of sleep each night

## 2017-09-20 ENCOUNTER — Ambulatory Visit: Payer: Managed Care, Other (non HMO) | Admitting: Registered"

## 2017-09-27 ENCOUNTER — Ambulatory Visit: Payer: Managed Care, Other (non HMO) | Admitting: Registered"

## 2017-10-20 ENCOUNTER — Ambulatory Visit: Payer: Managed Care, Other (non HMO) | Admitting: Family Medicine

## 2017-10-20 ENCOUNTER — Encounter: Payer: Self-pay | Admitting: Family Medicine

## 2017-10-20 VITALS — BP 110/80 | HR 85 | Temp 98.4°F | Wt 167.1 lb

## 2017-10-20 DIAGNOSIS — R350 Frequency of micturition: Secondary | ICD-10-CM | POA: Diagnosis not present

## 2017-10-20 DIAGNOSIS — R300 Dysuria: Secondary | ICD-10-CM | POA: Diagnosis not present

## 2017-10-20 LAB — POCT URINALYSIS DIPSTICK
Bilirubin, UA: NEGATIVE
GLUCOSE UA: NEGATIVE
Ketones, UA: NEGATIVE
Nitrite, UA: NEGATIVE
Protein, UA: NEGATIVE
Spec Grav, UA: 1.01 (ref 1.010–1.025)
Urobilinogen, UA: 0.2 E.U./dL
pH, UA: 7 (ref 5.0–8.0)

## 2017-10-20 MED ORDER — CIPROFLOXACIN HCL 500 MG PO TABS: 500.0000 mg | ORAL_TABLET | Freq: Two times a day (BID) | ORAL | 0 refills | Status: DC

## 2017-10-20 NOTE — Patient Instructions (Signed)

## 2017-10-20 NOTE — Progress Notes (Signed)
Subjective:     Patient ID: [name] [name], female   DOB: [birth year], 62 y.o.   MRN: [id number]  HPI Patient seen with symptoms of urine frequency and burning which started earlier today. She had similar symptoms last fall and urine culture then actually came back negative. She has not had any fever. No flank pain. No nausea or vomiting. No gross hematuria.  Past Medical History:  Diagnosis Date  . Diabetes mellitus without complication ([place])   . Ruptured ovarian cyst    Past Surgical History:  Procedure Laterality Date  . CESAREAN SECTION    . COLONOSCOPY  11-25-11   per Dr. Sharlett Iles, diverticulosis only, repeat in 10 yrs   . lumpectomy,benign, right breast     x2    reports that  has never smoked. she has never used smokeless tobacco. She reports that she drinks alcohol. She reports that she does not use drugs. family history includes Cancer in her other. Allergies  Allergen Reactions  . Hydrocodone-Homatropine Nausea And Vomiting     Review of Systems  Constitutional: Negative for chills and fever.  Gastrointestinal: Negative for abdominal pain, nausea and vomiting.  Genitourinary: Positive for dysuria and frequency. Negative for flank pain and hematuria.       Objective:   Physical Exam  Constitutional: She appears well-developed and well-nourished.  Cardiovascular: Normal rate and regular rhythm.  Pulmonary/Chest: Effort normal and breath sounds normal. No respiratory distress. She has no wheezes. She has no rales.  Musculoskeletal:  No flank tenderness       Assessment:     Dysuria. Rule out UTI. Urine dipstick reveals small blood and small leukocytes    Plan:     -Urine culture sent -Push fluids -Start Cipro 500 mg twice daily for 5 days pending culture results -Follow-up for any persistent or worsening symptoms  Eulas Post MD [facility]

## 2017-10-21 LAB — URINE CULTURE
MICRO NUMBER:: 90193328
RESULT: NO GROWTH
SPECIMEN QUALITY:: ADEQUATE

## 2017-10-29 ENCOUNTER — Ambulatory Visit: Payer: Managed Care, Other (non HMO) | Admitting: Family Medicine

## 2017-10-29 ENCOUNTER — Encounter: Payer: Self-pay | Admitting: Family Medicine

## 2017-10-29 VITALS — BP 132/74 | HR 78 | Temp 98.7°F | Ht 61.0 in | Wt 170.0 lb

## 2017-10-29 DIAGNOSIS — R350 Frequency of micturition: Secondary | ICD-10-CM | POA: Diagnosis not present

## 2017-10-29 LAB — URINALYSIS, ROUTINE W REFLEX MICROSCOPIC
Bilirubin Urine: NEGATIVE
Nitrite: NEGATIVE
Total Protein, Urine: NEGATIVE
Urine Glucose: NEGATIVE
Urobilinogen, UA: 0.2 (ref 0.0–1.0)
pH: 5.5 (ref 5.0–8.0)

## 2017-10-29 MED ORDER — FLUCONAZOLE 150 MG PO TABS: 150.0000 mg | ORAL_TABLET | Freq: Once | ORAL | 0 refills | Status: AC

## 2017-10-29 NOTE — Assessment & Plan Note (Signed)
Likely she has a yeast infection as she has just completed some antibiotics. Culture at that time showed no growth. - Diflucan today - Urinalysis and urine culture

## 2017-10-29 NOTE — Progress Notes (Signed)
[name] [name] - 62 y.o. female MRN [id number]  Date of birth: 1955/09/23  SUBJECTIVE:  Including CC & ROS.  Chief Complaint  Patient presents with  . Dysuria    [name] [name] is a 62 y.o. female that is presenting with dysuria. She has been having frequent urinating and pain. Symptoms started this morning. She was treated recently for a UTI and completed Cipro two days ago. Denies fevers. Admits to lower abdominal pain. She was treated for a UTI in November.  Review of Urine culture from 2/13 shows no growth.  Review of urine culture from 07/23/17 shows no growth.   Review of Systems  Constitutional: Negative for fever.  Respiratory: Negative for cough.   Cardiovascular: Negative for chest pain.  Gastrointestinal: Negative for abdominal pain.  Genitourinary: Positive for flank pain and urgency.  Musculoskeletal: Negative for back pain.  Skin: Negative for color change.  Neurological: Negative for weakness.  Hematological: Negative for adenopathy.    HISTORY: Past Medical, Surgical, Social, and Family History Reviewed & Updated per EMR.   Pertinent Historical Findings include:  Past Medical History:  Diagnosis Date  . Diabetes mellitus without complication ([place])   . Ruptured ovarian cyst     Past Surgical History:  Procedure Laterality Date  . CESAREAN SECTION    . COLONOSCOPY  11-25-11   per Dr. Sharlett Iles, diverticulosis only, repeat in 10 yrs   . lumpectomy,benign, right breast     x2    Allergies  Allergen Reactions  . Hydrocodone-Homatropine Nausea And Vomiting    Family History  Problem Relation Age of Onset  . Cancer Other        breast cancer  . Colon cancer Neg Hx   . Esophageal cancer Neg Hx   . Stomach cancer Neg Hx   . Rectal cancer Neg Hx      Social History   Socioeconomic History  . Marital status: Married    Spouse name: Not on file  . Number of children: Not on file  . Years of education: Not on file  . Highest education level: Not on file   Social Needs  . Financial resource strain: Not on file  . Food insecurity - worry: Not on file  . Food insecurity - inability: Not on file  . Transportation needs - medical: Not on file  . Transportation needs - non-medical: Not on file  Occupational History  . Not on file  Tobacco Use  . Smoking status: Never Smoker  . Smokeless tobacco: Never Used  Substance and Sexual Activity  . Alcohol use: Yes    Alcohol/week: 0.0 oz    Comment: maybe twice a month  . Drug use: No  . Sexual activity: Not on file  Other Topics Concern  . Not on file  Social History Narrative   Married   Never Smoker   Alcohol use-yes   Drug use-no           PHYSICAL EXAM:  VS: BP 132/74 (BP Location: Left Arm, Patient Position: Sitting, Cuff Size: Normal)   Pulse 78   Temp 98.7 F (37.1 C) (Oral)   Ht 5\' 1"  (1.549 m)   Wt 170 lb (77.1 kg)   SpO2 98%   BMI 32.12 kg/m  Physical Exam Gen: NAD, alert, cooperative with exam, well-appearing ENT: normal lips, normal nasal mucosa,  Eye: normal EOM, normal conjunctiva and lids CV:  no edema, +2 pedal pulses   Resp: no accessory muscle use, non-labored,  GI: no masses  or tenderness, no hernia  Skin: no rashes, no areas of induration  Neuro: normal tone, normal sensation to touch Psych:  normal insight, alert and oriented MSK: Normal gait, normal strength       ASSESSMENT & PLAN:   Urinary frequency Likely she has a yeast infection as she has just completed some antibiotics. Culture at that time showed no growth. - Diflucan today - Urinalysis and urine culture

## 2017-10-29 NOTE — Patient Instructions (Signed)
Please try the medication that I sent in free today. We will call you with results from today.

## 2017-10-30 LAB — URINE CULTURE
MICRO NUMBER: 90236455
Result:: NO GROWTH
SPECIMEN QUALITY:: ADEQUATE

## 2017-11-02 ENCOUNTER — Telehealth: Payer: Self-pay | Admitting: Family Medicine

## 2017-11-02 NOTE — Telephone Encounter (Signed)
Left VM for patient. If she calls back please have her speak with a nurse/CMA and ask if she is still having urine symptoms. Her urine showed large blood. Ask if she is having any back pain..   If any questions then please take the best time and phone number to call and I will try to call her back.   Rosemarie Ax, MD [facility] 11/02/2017, 5:29 PM

## 2017-11-15 ENCOUNTER — Encounter: Payer: Self-pay | Admitting: Family Medicine

## 2017-11-15 ENCOUNTER — Ambulatory Visit: Payer: Managed Care, Other (non HMO) | Admitting: Family Medicine

## 2017-11-15 VITALS — BP 130/78 | HR 92 | Temp 98.7°F | Wt 167.4 lb

## 2017-11-15 DIAGNOSIS — N3281 Overactive bladder: Secondary | ICD-10-CM | POA: Diagnosis not present

## 2017-11-15 DIAGNOSIS — J209 Acute bronchitis, unspecified: Secondary | ICD-10-CM | POA: Diagnosis not present

## 2017-11-15 MED ORDER — AZITHROMYCIN 250 MG PO TABS
ORAL_TABLET | ORAL | 0 refills | Status: DC
Start: 2017-11-15 — End: 2018-01-03

## 2017-11-15 MED ORDER — TOLTERODINE TARTRATE 2 MG PO TABS: 2.0000 mg | ORAL_TABLET | Freq: Two times a day (BID) | ORAL | 2 refills | Status: DC

## 2017-11-15 NOTE — Progress Notes (Signed)
   Subjective:    Patient ID: [name] [name], female    DOB: [birth year], 62 y.o.   MRN: [id number]  HPI Here for several issues. First she has had trouble with urinary urgency and urge incontinence for about 6 months. No burning or pain. No fever. She has been seen several times for this and has had negative urine cultures. Also for one week she has had chest tightness and coughing up yellow sputum.    Review of Systems  Constitutional: Negative.   HENT: Negative.   Eyes: Negative.   Respiratory: Positive for cough and chest tightness. Negative for shortness of breath and wheezing.   Cardiovascular: Negative.   Genitourinary: Positive for frequency and urgency. Negative for dysuria, flank pain and hematuria.       Objective:   Physical Exam  Constitutional: She appears well-developed and well-nourished.  HENT:  Right Ear: External ear normal.  Left Ear: External ear normal.  Nose: Nose normal.  Mouth/Throat: Oropharynx is clear and moist.  Eyes: Conjunctivae are normal.  Neck: No thyromegaly present.  Cardiovascular: Normal rate, regular rhythm, normal heart sounds and intact distal pulses.  Pulmonary/Chest: Effort normal. No respiratory distress. She has no wheezes. She has no rales.  Scattered rhonchi   Abdominal: Soft. Bowel sounds are normal. She exhibits no distension and no mass. There is no tenderness. There is no rebound and no guarding.  Lymphadenopathy:    She has no cervical adenopathy.          Assessment & Plan:  She has a bronchitis and we will treat this with a Zpack. Use Delsym prn. She also has OAB. She will try Detrol 2 mg bid. Refer to Urology.  Alysia Penna, MD

## 2017-12-13 ENCOUNTER — Ambulatory Visit (INDEPENDENT_AMBULATORY_CARE_PROVIDER_SITE_OTHER): Payer: Managed Care, Other (non HMO) | Admitting: Podiatry

## 2017-12-13 DIAGNOSIS — M7751 Other enthesopathy of right foot: Secondary | ICD-10-CM

## 2017-12-13 DIAGNOSIS — M779 Enthesopathy, unspecified: Secondary | ICD-10-CM

## 2017-12-13 MED ORDER — MELOXICAM 15 MG PO TABS: 15.0000 mg | ORAL_TABLET | Freq: Every day | ORAL | 1 refills | Status: DC

## 2017-12-13 MED ORDER — METHYLPREDNISOLONE 4 MG PO TBPK: ORAL_TABLET | ORAL | 0 refills | Status: DC

## 2017-12-15 NOTE — Progress Notes (Signed)
   HPI: 62 year old female presenting today with a chief complaint of a flare up of bilateral plantar fasciitis, right worse than left. She states the injections have helped alleviate the pain in the past. Walking and standing for long periods of time help alleviate the pain. She has not done anything to treat the pain. Patient is here for further evaluation and treatment.   Past Medical History:  Diagnosis Date  . Diabetes mellitus without complication ([place])   . Ruptured ovarian cyst      Physical Exam: General: The patient is alert and oriented x3 in no acute distress.  Dermatology: Skin is warm, dry and supple bilateral lower extremities. Negative for open lesions or macerations.  Vascular: Palpable pedal pulses bilaterally. No edema or erythema noted. Capillary refill within normal limits.  Neurological: Epicritic and protective threshold grossly intact bilaterally.   Musculoskeletal Exam: Pain with palpation to the 2nd MPJ of the right foot. Pain with palpation to the right sesamoid apparatus. Range of motion within normal limits to all pedal and ankle joints bilateral. Muscle strength 5/5 in all groups bilateral.   Assessment: 1. 2nd MPJ capsulitis right 2. Sesamoiditis right 3. Metatarsalgia bilateral right greater than left   Plan of Care:  1. Patient evaluated.  2. Injection of 0.5 mLs Celestone Soluspan injected into the 2nd MPJ of the right foot.  3. Prescription for Medrol Dose Pak provided to patient.  4. Prescription for Meloxicam provided to patient.  5. Recommended good shoe gear and orthotics.  6. Return to clinic as needed.   [facility] at [employer].       Edrick Kins, DPM [facility]  Dr. Edrick Kins, DPM    [address], [place] [address]                Office [phone]  Fax [phone]

## 2018-01-03 ENCOUNTER — Telehealth: Payer: Self-pay | Admitting: *Deleted

## 2018-01-03 ENCOUNTER — Ambulatory Visit: Payer: Managed Care, Other (non HMO) | Admitting: Family Medicine

## 2018-01-03 ENCOUNTER — Encounter: Payer: Self-pay | Admitting: *Deleted

## 2018-01-03 ENCOUNTER — Encounter: Payer: Self-pay | Admitting: Family Medicine

## 2018-01-03 VITALS — BP 125/77 | HR 94 | Temp 99.9°F | Resp 12 | Ht 61.0 in | Wt 160.5 lb

## 2018-01-03 DIAGNOSIS — J069 Acute upper respiratory infection, unspecified: Secondary | ICD-10-CM | POA: Diagnosis not present

## 2018-01-03 DIAGNOSIS — J029 Acute pharyngitis, unspecified: Secondary | ICD-10-CM

## 2018-01-03 LAB — POCT RAPID STREP A (OFFICE): Rapid Strep A Screen: NEGATIVE

## 2018-01-03 MED ORDER — BENZONATATE 100 MG PO CAPS: 200.0000 mg | ORAL_CAPSULE | Freq: Two times a day (BID) | ORAL | 0 refills | Status: AC | PRN

## 2018-01-03 MED ORDER — MAGIC MOUTHWASH W/LIDOCAINE: 5.0000 mL | Freq: Three times a day (TID) | ORAL | 0 refills | Status: AC | PRN

## 2018-01-03 NOTE — Patient Instructions (Signed)
A few things to remember from today's visit:   Sore throat - Plan: POC Rapid Strep A  URI, acute - Plan: benzonatate (TESSALON) 100 MG capsule  Acute pharyngitis, unspecified etiology - Plan: magic mouthwash w/lidocaine SOLN  Faringitis (Pharyngitis) La faringitis es el dolor de garganta (faringe). La garganta presenta enrojecimiento, hinchazn y dolor. CUIDADOS EN EL HOGAR  Beba suficiente lquido para mantener la orina clara o de color amarillo plido.  Solo tome los medicamentos que le haya indicado su mdico. ? Si no toma los medicamentos segn las indicaciones podra volver a enfermarse. Finalice la prescripcin completa, aunque comience a sentirse mejor. ? No tome aspirina.  Reposo.  Enjuguese la boca Immunologist) con agua y sal (cucharadita de sal por litro de agua) cada 1 o 2horas. Esto ayudar a Best boy.  Si no corre riesgo de ahogarse, puede chupar un caramelo duro o pastillas para la garganta.  SOLICITE AYUDA SI:  Tiene bultos grandes y dolorosos al tacto en el cuello.  Tiene una erupcin cutnea.  Cuando tose elimina una expectoracin verde, amarillo amarronado o con Gregory.  SOLICITE AYUDA DE INMEDIATO SI:  Presenta rigidez en el cuello.  Babea o no puede tragar lquidos.  Vomita o no puede retener los [employer] lquidos.  Siente un dolor intenso que no se alivia con medicamentos.  Tiene problemas para Ambulance person (y no debido a la nariz tapada).  ASEGRESE DE QUE:  Comprende estas instrucciones.  Controlar su afeccin.  Recibir ayuda de inmediato si no mejora o si empeora.  Esta informacin no tiene Marine scientist el consejo del mdico. Asegrese de hacerle al mdico cualquier pregunta que tenga. Document Released: 11/20/2008 Document Revised: 06/14/2013 Document Reviewed: 05/01/2013 Elsevier Interactive Patient Education  2017 [place].   Please be sure medication list is accurate. If a new problem present,  please set up appointment sooner than planned today.

## 2018-01-03 NOTE — Telephone Encounter (Signed)
Clarise Cruz called from [facility] in regards to the Rx for Magic mouthwash with Lidocaine.  She stated the Rx was written for 1:1:1 diphenhydramine and aluminum and magnesium hydroxide mix and lidocaine.  She stated the aluminium hydroxide mixture they have has simethicone in it and she asked if that is OK to use?  Please call [facility] [phone].

## 2018-01-03 NOTE — Telephone Encounter (Signed)
Message sent to Dr. Jordan for review and approval. 

## 2018-01-03 NOTE — Progress Notes (Signed)
ACUTE VISIT  HPI:  Chief Complaint  Patient presents with  . Sore Throat    sx started Saturday  . Cough  . Fever    Ms.[name] [name] is a 62 y.o.female here today complaining of 2-3 days of respiratory symptoms.  + Fever 102.0 F Sore throat, aggravated by swallowing and coughing. Postnasal drainage but no nasal congestion or rhinorrhea.   HPI     No Hx of recent travel. No sick contact. No known insect bite.  Hx of allergies: No  OTC medications for this problem: Mucinex  Symptoms otherwise stable.     Review of Systems  Constitutional: Positive for activity change, appetite change, chills, fatigue and fever.  HENT: Positive for postnasal drip and sore throat. Negative for congestion, ear pain, mouth sores, sinus pressure, trouble swallowing and voice change.   Eyes: Negative for discharge, redness and itching.  Respiratory: Positive for cough. Negative for chest tightness, shortness of breath and wheezing.   Gastrointestinal: Negative for abdominal pain, diarrhea, nausea and vomiting.  Musculoskeletal: Positive for myalgias. Negative for gait problem and neck pain.  Skin: Negative for rash.  Allergic/Immunologic: Negative for environmental allergies.  Neurological: Negative for weakness and headaches.  Hematological: Negative for adenopathy. Does not bruise/bleed easily.      Current Outpatient Medications on File Prior to Visit  Medication Sig Dispense Refill  . metFORMIN (GLUCOPHAGE) 500 MG tablet Take 1 tablet (500 mg total) 2 (two) times daily with a meal by mouth. 180 tablet 3   Current Facility-Administered Medications on File Prior to Visit  Medication Dose Route Frequency Provider Last Rate Last Dose  . betamethasone acetate-betamethasone sodium phosphate (CELESTONE) injection 3 mg  3 mg Intramuscular Once Edrick Kins, DPM         Past Medical History:  Diagnosis Date  . Diabetes mellitus without complication ([place])   . Ruptured  ovarian cyst    Allergies  Allergen Reactions  . Hydrocodone-Homatropine Nausea And Vomiting    Social History   Socioeconomic History  . Marital status: Married    Spouse name: Not on file  . Number of children: Not on file  . Years of education: Not on file  . Highest education level: Not on file  Occupational History  . Not on file  Social Needs  . Financial resource strain: Not on file  . Food insecurity:    Worry: Not on file    Inability: Not on file  . Transportation needs:    Medical: Not on file    Non-medical: Not on file  Tobacco Use  . Smoking status: Never Smoker  . Smokeless tobacco: Never Used  Substance and Sexual Activity  . Alcohol use: Yes    Alcohol/week: 0.0 oz    Comment: maybe twice a month  . Drug use: No  . Sexual activity: Not on file  Lifestyle  . Physical activity:    Days per week: Not on file    Minutes per session: Not on file  . Stress: Not on file  Relationships  . Social connections:    Talks on phone: Not on file    Gets together: Not on file    Attends religious service: Not on file    Active member of club or organization: Not on file    Attends meetings of clubs or organizations: Not on file    Relationship status: Not on file  Other Topics Concern  . Not on file  Social  History Narrative   Married   Never Smoker   Alcohol use-yes   Drug use-no          Vitals:   01/03/18 1441  BP: 125/77  Pulse: 94  Resp: 12  Temp: 99.9 F (37.7 C)  SpO2: 96%   Body mass index is 30.33 kg/m.   Physical Exam  Nursing note and vitals reviewed. Constitutional: She is oriented to person, place, and time. She appears well-developed. She does not appear ill. No distress.  HENT:  Head: Atraumatic.  Right Ear: Tympanic membrane, external ear and ear canal normal.  Left Ear: Tympanic membrane, external ear and ear canal normal.  Mouth/Throat: Uvula is midline and mucous membranes are normal. Posterior oropharyngeal erythema  present. No posterior oropharyngeal edema.  Thin layer of whitish exudate on left tonsil.  Eyes: Conjunctivae are normal.  Neck: No muscular tenderness present. No edema and no erythema present.  Cardiovascular: Normal rate and regular rhythm.  No murmur heard. Respiratory: Effort normal and breath sounds normal. No stridor. No respiratory distress.  Lymphadenopathy:    She has cervical adenopathy.       Right cervical: Posterior cervical adenopathy present.       Left cervical: Posterior cervical adenopathy present.  Neurological: She is alert and oriented to person, place, and time. She has normal strength. Gait normal.  Skin: Skin is warm. No rash noted. No erythema.  Psychiatric: She has a normal mood and affect. Her speech is normal.  Well groomed, good eye contact.      ASSESSMENT AND PLAN:  Ms. [name] [name] was seen today for sore throat, cough and fever.  Diagnoses and all orders for this visit:  Sore throat -     POC Rapid Strep A -     Culture, Group A Strep  URI, acute -     benzonatate (TESSALON) 100 MG capsule; Take 2 capsules (200 mg total) by mouth 2 (two) times daily as needed for up to 10 days. -     Culture, Group A Strep  Acute pharyngitis, unspecified etiology -     magic mouthwash w/lidocaine SOLN; Take 5 mLs by mouth 3 (three) times daily as needed for up to 10 days for mouth pain. 50 ml of diphenhydramine, alum and mag hydroxide, and lidocaine to make 150 ml -     Culture, Group A Strep    Symptoms suggests a viral etiology, for now I am recommending symptomatic treatment. Rapid strep done today was negative, we will follow strep culture and give recommendations accordingly. Throat lozenges, gargles with saline, and Magic mouthwash with lidocaine may help with sore throat. I also explained that cough can last a few days and sometimes weeks. Instructed about warning signs. F/U as needed.    -Ms. [name] [name] was advised to seek attention immediately if  symptoms worsen or to follow if they persist or new concerns arise.       Bari Handshoe G. Martinique, MD  [facility]. [place] office.

## 2018-01-04 ENCOUNTER — Telehealth: Payer: Self-pay | Admitting: Family Medicine

## 2018-01-04 NOTE — Telephone Encounter (Signed)
Sent to PCP ?

## 2018-01-04 NOTE — Telephone Encounter (Signed)
Please okay this change

## 2018-01-04 NOTE — Telephone Encounter (Signed)
Copied from [place] [id number]. Topic: Quick Communication - Rx Refill/Question >> Jan 04, 2018  2:00 PM Boyd Kerbs wrote: Medication:  magic mouth wash w/lidocaine SOLN  [facility] did not get this prescription and needs re-se  Has the patient contacted their pharmacy? Yes.   (Agent: If no, request that the patient contact the pharmacy for the refill.) Preferred Pharmacy (with phone number or street name):   [facility] [address], [place] - [place] [address] [place] [address] Phone: [phone] Fax: [phone]   Agent: Please be advised that RX refills may take up to 3 business days. We ask that you follow-up with your pharmacy.

## 2018-01-04 NOTE — Telephone Encounter (Signed)
Copied from [place] [phone]. Topic: Quick Communication - See Telephone Encounter >> Jan 04, 2018  2:02 PM Boyd Kerbs wrote: CRM for notification.   Pt. Saw Dr. Martinique yesterday.  She is not feeling any better and having hard time, can't sleep and throat hurts badly because she did not get medication. She is worried about going back to work on Thursday feeling the was she does and may need more time off work.  May need a different note.   See Telephone encounter for: 01/04/18.

## 2018-01-04 NOTE — Telephone Encounter (Signed)
Called and spoke with pharmacist gave them the [facility] to use what they have.

## 2018-01-04 NOTE — Telephone Encounter (Signed)
Message sent to Dr. Jordan for review. 

## 2018-01-04 NOTE — Telephone Encounter (Signed)
Call from [facility]-must get approval for magic mouthwash ingredients, they have (alum/mag/simethicone) and other 2 ingredients, okay to approve this? They do not have plain alum&mag.

## 2018-01-05 ENCOUNTER — Ambulatory Visit: Payer: Managed Care, Other (non HMO) | Admitting: Family Medicine

## 2018-01-05 ENCOUNTER — Encounter: Payer: Self-pay | Admitting: Family Medicine

## 2018-01-05 VITALS — BP 138/76 | HR 96 | Temp 99.0°F | Ht 61.0 in | Wt 159.6 lb

## 2018-01-05 DIAGNOSIS — J209 Acute bronchitis, unspecified: Secondary | ICD-10-CM | POA: Diagnosis not present

## 2018-01-05 LAB — CULTURE, GROUP A STREP
MICRO NUMBER:: 90519125
SPECIMEN QUALITY: ADEQUATE

## 2018-01-05 MED ORDER — AMOXICILLIN-POT CLAVULANATE 875-125 MG PO TABS: 1.0000 | ORAL_TABLET | Freq: Two times a day (BID) | ORAL | 0 refills | Status: DC

## 2018-01-05 MED ORDER — HYDROCODONE-HOMATROPINE 5-1.5 MG/5ML PO SYRP: 5.0000 mL | ORAL_SOLUTION | ORAL | 0 refills | Status: DC | PRN

## 2018-01-05 NOTE — Telephone Encounter (Signed)
I will prefer not to add Simethicone. Can she take Rx to a different pharmacy?  Otherwise we can do jut viscous Lidocaine. Throat lozenges and gargles with saline.  Thanks, BJ

## 2018-01-05 NOTE — Telephone Encounter (Signed)
Called the pharmacy they did receive the Rx they just needed to verify that pt was OK with the cost before getting the medication ready. The cost was 15 buck verified with pt she stated that she was fine to pay the 15 bucks for the medication and verified this with the pharmacist. They are getting the medication ready for the pt today. Pt was advised of this today at her OV.

## 2018-01-05 NOTE — Telephone Encounter (Signed)
Called the pharmacy this morning @ 8:36 AM the pharmacy does NOT open until 10 AM. Will call back later.

## 2018-01-05 NOTE — Progress Notes (Signed)
   Subjective:    Patient ID: Suly Vukelich, female    DOB: 09/25/1955, 62 y.o.   MRN: [id number]  HPI Here for a worsening cough that started about a week ago. At first it was dry but now it produces green sputum. No fever. Using Benzonatate without success. Nyquil does not help.    Review of Systems  Constitutional: Negative.   HENT: Negative.   Eyes: Negative.   Respiratory: Positive for cough and chest tightness. Negative for shortness of breath and wheezing.        Objective:   Physical Exam  Constitutional: She appears well-developed and well-nourished.  HENT:  Right Ear: External ear normal.  Left Ear: External ear normal.  Nose: Nose normal.  Mouth/Throat: Oropharynx is clear and moist.  Eyes: Conjunctivae are normal.  Neck: Neck supple.  Pulmonary/Chest: Effort normal. She has no wheezes. She has no rales.  Scattered rhonchi           Assessment & Plan:  Bronchitis, treat with Augmentin and Hydromet syrup. Written out of work today until 01-08-18. Alysia Penna, MD

## 2018-01-05 NOTE — Telephone Encounter (Signed)
I saw her on 01/03/18 for acute visit. She was evaluated today by her PCP, so recommend following recommendations given today. Thanks, BJ

## 2018-01-07 NOTE — Telephone Encounter (Signed)
Patient had f/u with her PCP, no further instructions from Dr. Martinique.

## 2018-01-10 ENCOUNTER — Ambulatory Visit: Payer: Managed Care, Other (non HMO) | Admitting: Podiatry

## 2018-03-25 ENCOUNTER — Ambulatory Visit: Payer: Managed Care, Other (non HMO) | Admitting: Family Medicine

## 2018-04-01 ENCOUNTER — Ambulatory Visit: Payer: Managed Care, Other (non HMO) | Admitting: Family Medicine

## 2018-04-01 ENCOUNTER — Encounter: Payer: Self-pay | Admitting: Family Medicine

## 2018-04-01 VITALS — BP 138/76 | HR 74 | Temp 98.0°F | Ht 61.0 in | Wt 154.6 lb

## 2018-04-01 DIAGNOSIS — E119 Type 2 diabetes mellitus without complications: Secondary | ICD-10-CM | POA: Diagnosis not present

## 2018-04-01 NOTE — Progress Notes (Signed)
   Subjective:    Patient ID: [name] [name], female    DOB: 1955/12/24, 62 y.o.   MRN: [id number]  HPI Here to follow up on diabetes. She feels great. She has lost 17 lbs since April. She has been taking only one Metformin a day for several months. Her A1c today is 6.6 (down from 7.2 last November).    Review of Systems  Constitutional: Negative.   Respiratory: Negative.   Cardiovascular: Negative.   Neurological: Negative.        Objective:   Physical Exam  Constitutional: She is oriented to person, place, and time. She appears well-developed and well-nourished.  Cardiovascular: Normal rate, regular rhythm, normal heart sounds and intact distal pulses.  Pulmonary/Chest: Effort normal and breath sounds normal. No stridor. No respiratory distress. She has no wheezes. She has no rales.  Neurological: She is alert and oriented to person, place, and time.          Assessment & Plan:  Her diabetes is now well controlled. She will continue with diet and exercise. We agreed to stop the Metformin, and we will check another A1c at her well exam in November. Alysia Penna, MD

## 2018-04-04 LAB — POCT GLYCOSYLATED HEMOGLOBIN (HGB A1C): Hemoglobin A1C: 6.6 % — AB (ref 4.0–5.6)

## 2018-06-30 ENCOUNTER — Other Ambulatory Visit: Payer: Self-pay | Admitting: Family Medicine

## 2018-07-23 ENCOUNTER — Encounter: Payer: Self-pay | Admitting: Family Medicine

## 2018-07-23 ENCOUNTER — Ambulatory Visit: Payer: 59 | Admitting: Family Medicine

## 2018-07-23 VITALS — BP 136/84 | HR 67 | Temp 98.0°F | Wt 152.0 lb

## 2018-07-23 DIAGNOSIS — R0981 Nasal congestion: Secondary | ICD-10-CM | POA: Diagnosis not present

## 2018-07-23 MED ORDER — FLUTICASONE PROPIONATE 50 MCG/ACT NA SUSP: 2.0000 | Freq: Every day | NASAL | 6 refills | Status: DC

## 2018-07-23 NOTE — Assessment & Plan Note (Signed)
Symptoms most consistent with allergic cause, not bacterial.  Supportive care reviewed as per instructions. Update if not improving with treatment.

## 2018-07-23 NOTE — Progress Notes (Signed)
BP 136/84   Pulse 67   Temp 98 F (36.7 C) (Oral)   Wt 152 lb (68.9 kg)   SpO2 97%   BMI 28.72 kg/m    CC: cough/congestion Subjective:    Patient ID: [name] [name], female    DOB: Dec 31, 1955, 62 y.o.   MRN: [id number]  HPI: [name] [name] is a 62 y.o. female presenting on 07/23/2018 for Runny Nose (x 2 weeks... pt has tried OTC Nyquil, Theraflu, Mucinex, Fonase and allergy medicine with no relief); Nasal Congestion; and Cough (productive clear/green... PND)   2 wk h/o nasal congestion, sinus drainage, throat irritation leading to nagging cough.   No fevers/chills, nausea, body aches, fatigue. No significant chest congestion. No dyspnea or wheezing, headache or sinus pressure.   Treating with mucinex, nyquil, theraflu. Nothing has been beneficial. Tessalon perls ineffective.  No h/o asthma or allergies. No sick contacts at home Non smoker   Relevant past medical, surgical, family and social history reviewed and updated as indicated. Interim medical history since our last visit reviewed. Allergies and medications reviewed and updated. Outpatient Medications Prior to Visit  Medication Sig Dispense Refill  . metFORMIN (GLUCOPHAGE) 500 MG tablet Take 1 tablet (500 mg total) 2 (two) times daily with a meal by mouth. (Patient not taking: Reported on 07/23/2018) 180 tablet 3   Facility-Administered Medications Prior to Visit  Medication Dose Route Frequency Provider Last Rate Last Dose  . betamethasone acetate-betamethasone sodium phosphate (CELESTONE) injection 3 mg  3 mg Intramuscular Once Edrick Kins, DPM         Per HPI unless specifically indicated in ROS section below Review of Systems     Objective:    BP 136/84   Pulse 67   Temp 98 F (36.7 C) (Oral)   Wt 152 lb (68.9 kg)   SpO2 97%   BMI 28.72 kg/m   Wt Readings from Last 3 Encounters:  07/23/18 152 lb (68.9 kg)  04/01/18 154 lb 9.6 oz (70.1 kg)  01/05/18 159 lb 9.6 oz (72.4 kg)    Physical Exam    Constitutional: She appears well-developed and well-nourished. No distress.  HENT:  Head: Normocephalic and atraumatic.  Right Ear: Hearing, tympanic membrane, external ear and ear canal normal.  Left Ear: Hearing, tympanic membrane, external ear and ear canal normal.  Nose: Mucosal edema and rhinorrhea present. Right sinus exhibits no maxillary sinus tenderness and no frontal sinus tenderness. Left sinus exhibits no maxillary sinus tenderness and no frontal sinus tenderness.  Mouth/Throat: Uvula is midline, oropharynx is clear and moist and mucous membranes are normal. No oropharyngeal exudate, posterior oropharyngeal edema, posterior oropharyngeal erythema or tonsillar abscesses.  Eyes: Pupils are equal, round, and reactive to light. Conjunctivae and EOM are normal. No scleral icterus.  Neck: Normal range of motion. Neck supple.  Cardiovascular: Normal rate, regular rhythm, normal heart sounds and intact distal pulses.  No murmur heard. Pulmonary/Chest: Effort normal and breath sounds normal. No respiratory distress. She has no wheezes. She has no rales.  Lymphadenopathy:    She has no cervical adenopathy.  Skin: Skin is warm and dry. No rash noted.  Nursing note and vitals reviewed.  Results for orders placed or performed in visit on 04/01/18  POCT glycosylated hemoglobin (Hb A1C)  Result Value Ref Range   Hemoglobin A1C 6.6 (A) 4.0 - 5.6 %   HbA1c POC (<> result, manual entry)  4.0 - 5.6 %   HbA1c, POC (prediabetic range)  5.7 - 6.4 %  HbA1c, POC (controlled diabetic range)  0.0 - 7.0 %      Assessment & Plan:   Problem List Items Addressed This Visit    Sinus congestion - Primary    Symptoms most consistent with allergic cause, not bacterial.  Supportive care reviewed as per instructions. Update if not improving with treatment.           Meds ordered this encounter  Medications  . fluticasone (FLONASE) 50 MCG/ACT nasal spray    Sig: Place 2 sprays into both nostrils  daily.    Dispense:  16 g    Refill:  6   No orders of the defined types were placed in this encounter.   Follow up plan: Return if symptoms worsen or fail to improve.  Ria Bush, MD

## 2018-07-23 NOTE — Patient Instructions (Addendum)
Creo que tiene Set designer - trate con claritin diaria y flonase diario (sin receta).   Rinitis alrgica en adultos Allergic Rhinitis, Adult La rinitis alrgica es una reaccin alrgica que afecta la membrana mucosa que se encuentra en la nariz. Provoca estornudos, goteo o congestin nasal, y la sensacin de que baja mucosidad por la parte trasera de la garganta(goteo posnasal). La rinitis alrgica puede ser de leve a grave. [employer] tipos de rinitis alrgica:  Psychologist, counselling. Este tipo tambin se denomina fiebre del heno. Sucede nicamente durante algunas estaciones.  Perenne. Este tipo puede ocurrir en cualquier momento del ao.  Cules son las causas? Esta afeccin ocurre cuando el sistema de defensa del cuerpo(sistema inmunitario) reacciona a ciertas sustancias inofensivas llamadas alrgenos como si fueran grmenes.  La rinitis alrgica estacional se desencadena por el polen, que puede provenir del csped, los rboles y [place]. La rinitis alrgica perenne puede ser causada por:  Los caros del polvo en Engineer, mining.  La caspa de las [place].  Esporas del moho.  Cules son los signos o los sntomas? Los sntomas de esta afeccin incluyen lo siguiente:  Estornudos.  Nariz tapada o que gotea (congestin nasal).  Goteo posnasal.  Escozor en la [place].  Ojos llorosos.  Dificultad para dormir.  [place].  Cmo se diagnostica? Esta afeccin se puede diagnosticar en funcin de lo siguiente:  Sus antecedentes mdicos.  Un examen fsico.  Estudios para detectar afecciones relacionadas, como las siguientes: ? Asma. ? Ojo rojo. ? Infeccin en los odos. ? Infeccin de las vas respiratorias superiores.  Estudios para Development worker, international aid sus sntomas. Por ejemplo, anlisis de sangre y cutneos.  Cmo se trata? No hay cura para esta afeccin, pero el tratamiento puede ayudar a [employer] sntomas. El tratamiento puede incluir lo  siguiente:  Tomar medicamentos que [employer] sntomas de la [place], [place] antihistamnicos. Medicamentos que pueden administrarse por inyeccin, aerosol nasal o pldoras.  Evitar el alrgeno.  Desensibilizacin. Este tratamiento implica que se le apliquen inyecciones continuas hasta que su cuerpo se vuelva menos sensible al alrgeno. Este tratamiento se puede realizar si otros tratamientos no son eficaces.  Si tomar medicamentos y English as a second language teacher alrgeno no funciona, se pueden recetar nuevos medicamentos ms fuertes.  Siga estas indicaciones en su casa:  Conozca a qu es Air cabin crew. Los alrgenos comunes incluyen el humo, polvo y [place].  Evite las cosas a las cuales es alrgico. Hay medidas que puede tomar para ayudar a Product/process development scientist los alrgenos: ? [employer] alfombras por pisos de [place], baldosas o vinilo. Las alfombras pueden retener la caspa de los animales y [place]. ? No fume. No permita que fumen en su casa. ? Cambie el filtro de la calefaccin y del aire acondicionado al menos una vez al mes. ? Durante la temporada de alergias:  Mantenga las ventanas cerradas la mayor cantidad de [place] posible.  Planee actividades al aire libre cuando las concentraciones de polen estn en su nivel ms bajo. Normalmente, esto es [employer] de noche.  Cuando ingrese al interior, [place] de ropa y dese una ducha antes de sentarse en los muebles o la cama.  Tome los medicamentos de venta libre y los recetados solamente como se lo haya indicado el mdico.  Consulting civil engineer a todas las visitas de seguimiento como se lo haya indicado el mdico. Esto es importante. Comunquese con un mdico si:  Tiene fiebre.  Tiene tos persistente.  Comienza a emitir un sonido agudo al Ambulance person (  sibilancia).  Sus sntomas interfieren con sus actividades diarias normales. Solicite ayuda de inmediato si:  Le falta el aire. Resumen  Esta afeccin puede controlarse al tomar [employer] como se le indique y al  Management consultant.  Comunquese con su mdico si tiene tos persistente o fiebre.  Durante la temporada de [place], [place] las ventanas cerradas la mayor cantidad de [place] posible. Esta informacin no tiene Marine scientist el consejo del mdico. Asegrese de hacerle al mdico cualquier pregunta que tenga. Document Released: 06/03/2005 Document Revised: 12/09/2016 Document Reviewed: 12/09/2016 Elsevier Interactive Patient Education  [employer].

## 2018-08-17 ENCOUNTER — Encounter: Payer: Managed Care, Other (non HMO) | Admitting: Family Medicine

## 2018-08-26 ENCOUNTER — Encounter (HOSPITAL_COMMUNITY): Payer: Self-pay | Admitting: Emergency Medicine

## 2018-08-26 ENCOUNTER — Ambulatory Visit (HOSPITAL_COMMUNITY)
Admission: EM | Admit: 2018-08-26 | Discharge: 2018-08-26 | Disposition: A | Discharge status: Home / Self Care | Payer: 59 | Attending: Internal Medicine | Admitting: Internal Medicine

## 2018-08-26 DIAGNOSIS — F5101 Primary insomnia: Secondary | ICD-10-CM

## 2018-08-26 DIAGNOSIS — G4700 Insomnia, unspecified: Secondary | ICD-10-CM | POA: Diagnosis present

## 2018-08-26 DIAGNOSIS — Z885 Allergy status to narcotic agent status: Secondary | ICD-10-CM | POA: Diagnosis not present

## 2018-08-26 DIAGNOSIS — I10 Essential (primary) hypertension: Secondary | ICD-10-CM | POA: Diagnosis not present

## 2018-08-26 DIAGNOSIS — R251 Tremor, unspecified: Secondary | ICD-10-CM | POA: Diagnosis not present

## 2018-08-26 LAB — POCT I-STAT, CHEM 8
BUN: 22 mg/dL — ABNORMAL HIGH (ref 6–20)
Calcium, Ion: 1.07 mmol/L — ABNORMAL LOW (ref 1.15–1.40)
Chloride: 107 mmol/L (ref 98–111)
Creatinine, Ser: 0.6 mg/dL (ref 0.44–1.00)
Glucose, Bld: 99 mg/dL (ref 70–99)
HCT: 40 % (ref 36.0–46.0)
Hemoglobin: 13.6 g/dL (ref 12.0–15.0)
Potassium: 3.8 mmol/L (ref 3.5–5.1)
Sodium: 138 mmol/L (ref 135–145)
TCO2: 25 mmol/L (ref 22–32)

## 2018-08-26 LAB — TSH: TSH: 1.283 u[IU]/mL (ref 0.350–4.500)

## 2018-08-26 LAB — T4, FREE: Free T4: 1 ng/dL (ref 0.82–1.77)

## 2018-08-26 NOTE — ED Provider Notes (Signed)
[place]    CSN: [id number] Arrival date & time: 08/26/18  1610     History   Chief Complaint Chief Complaint  Patient presents with  . Insomnia    HPI [name] [name] is a 62 y.o. female.   Who presents due to not being able to fall asleep after sleeping 3 hours x 8-9 hours. Prior to this had no problems sleeping.  Has been having HA's on her temples and been taking plain Advil x 2 days only. Today she bought Advil pm. Has past history of HTN and after loosing 17 lb  And her HTN and DM resolved. She has continued watching her weight, but due to her R knee hurting and cold weather, she has not been walking for several weeks. She still has BP med left at home, but has not re-started this. She denies being stressed, she is just not tired ones she wakes up. But yawn all day at work.     Past Medical History:  Diagnosis Date  . Diabetes mellitus without complication ([place])   . Ruptured ovarian cyst     Patient Active Problem List   Diagnosis Date Noted  . Sinus congestion 07/23/2018  . Urinary frequency 10/29/2017  . Benign paroxysmal positional vertigo 11/02/2014  . Diabetes mellitus without complication ([place]) 29/79/8921  . Special screening for malignant neoplasms, colon 11/25/2011  . Diverticulosis of colon (without mention of hemorrhage) 11/25/2011  . URTICARIA 01/31/2010  . DE QUERVAIN'S TENOSYNOVITIS 01/31/2010  . ECZEMA 05/03/2009  . CANDIDIASIS OF VULVA AND VAGINA 06/27/2008  . ACUTE SINUSITIS, UNSPECIFIED 05/22/2008    Past Surgical History:  Procedure Laterality Date  . CESAREAN SECTION    . COLONOSCOPY  11-25-11   per Dr. Sharlett Iles, diverticulosis only, repeat in 10 yrs   . lumpectomy,benign, right breast     x2    OB History   No obstetric history on file.      Home Medications    Prior to Admission medications   Medication Sig Start Date End Date Taking? Authorizing Provider  fluticasone (FLONASE) 50 MCG/ACT nasal spray Place 2 sprays  into both nostrils daily. 07/23/18   Ria Bush, MD    Family History Family History  Problem Relation Age of Onset  . Cancer Other        breast cancer  . Colon cancer Neg Hx   . Esophageal cancer Neg Hx   . Stomach cancer Neg Hx   . Rectal cancer Neg Hx     Social History Social History   Tobacco Use  . Smoking status: Never Smoker  . Smokeless tobacco: Never Used  Substance Use Topics  . Alcohol use: Yes    Alcohol/week: 0.0 standard drinks    Comment: maybe twice a month  . Drug use: No     Allergies   Hydrocodone-homatropine   Review of Systems Review of Systems  Constitutional: Negative for appetite change, chills, diaphoresis and fever.  HENT: Negative.   Eyes: Negative for visual disturbance.  Respiratory: Negative for apnea, cough, chest tightness and shortness of breath.   Cardiovascular: Positive for chest pain. Negative for palpitations and leg swelling.  Gastrointestinal: Negative for constipation, diarrhea, nausea and vomiting.       Has had  Soft stools 3-4 times a day for 3 days last week  Endocrine: Negative for cold intolerance and heat intolerance.  Genitourinary: Negative for difficulty urinating.  Musculoskeletal: Negative for gait problem and myalgias.  Skin: Negative for rash.  Neurological:  Positive for tremors, light-headedness and headaches. Negative for dizziness, speech difficulty and weakness.       When she gets up in the am  Psychiatric/Behavioral: The patient is not nervous/anxious.        Denies depression     Physical Exam Triage Vital Signs ED Triage Vitals  Enc Vitals Group     BP 08/26/18 1637 (!) 167/108     Pulse Rate 08/26/18 1637 74     Resp 08/26/18 1637 18     Temp 08/26/18 1637 98.5 F (36.9 C)     Temp Source 08/26/18 1637 Oral     SpO2 08/26/18 1637 97 %     Weight --      Height --      Head Circumference --      Peak Flow --      Pain Score 08/26/18 1638 0     Pain Loc --      Pain Edu? --       Excl. in [place]? --    No data found.  Updated Vital Signs BP (!) 167/108 (BP Location: Left Arm)   Pulse 74   Temp 98.5 F (36.9 C) (Oral)   Resp 18   SpO2 97%  BP repeated 160/68  Visual Acuity Right Eye Distance:   Left Eye Distance:   Bilateral Distance:    Right Eye Near:   Left Eye Near:    Bilateral Near:     Physical Exam Vitals signs and nursing note reviewed.  Constitutional:      General: She is not in acute distress.    Appearance: Normal appearance.  HENT:     Head: Normocephalic and atraumatic.     Comments: Massaging her temples feels good to her. Temporal arteries are normal, non tender.    Nose: Nose normal.     Mouth/Throat:     Mouth: Mucous membranes are moist.  Eyes:     General: No scleral icterus.       Right eye: No discharge.        Left eye: No discharge.     Extraocular Movements: Extraocular movements intact.     Conjunctiva/sclera: Conjunctivae normal.     Pupils: Pupils are equal, round, and reactive to light.  Neck:     Musculoskeletal: Neck supple. No neck rigidity.     Vascular: No carotid bruit.     Comments: No thyroidmegally Cardiovascular:     Rate and Rhythm: Normal rate and regular rhythm.     Pulses: Normal pulses.     Heart sounds: No murmur.     Comments: No edema Pulmonary:     Effort: Pulmonary effort is normal.     Breath sounds: Normal breath sounds.  Musculoskeletal: Normal range of motion.  Lymphadenopathy:     Cervical: No cervical adenopathy.  Skin:    General: Skin is warm and dry.  Neurological:     General: No focal deficit present.     Mental Status: She is alert and oriented to person, place, and time.     Sensory: No sensory deficit.     Motor: No weakness.     Coordination: Coordination normal.     Gait: Gait normal.     Deep Tendon Reflexes: Reflexes normal.     Comments: Normal rhomberg, tandem gait, finger to nose, heel and tip toe gait.  Has mild fine tremor when she extends her hands out    Psychiatric:  Mood and Affect: Mood normal.        Behavior: Behavior normal.        Thought Content: Thought content normal.        Judgment: Judgment normal.    UC Treatments / Results  Labs (all labs ordered are listed, but only abnormal results are displayed) Labs Reviewed  POCT I-STAT, CHEM 8 - Abnormal; Notable for the following components:      Result Value   BUN 22 (*)    Calcium, Ion 1.07 (*)    All other components within normal limits  TSH  T3, FREE  T4, FREE  I-STAT CHEM 8, ED    EKG Normal  Radiology No results found.  Procedures Procedures   Medications Ordered in UC Medications - No data to display  Initial Impression / Assessment and Plan / UC Course  I have reviewed the triage vital signs and the nursing notes. I reviewed her vitals from November and July this year and her BP was around 130's/mid 80's. Her weight is stable, so BP may be elevated because she has been on 3-4 hours of sleep for the past week. I ordered Chem 8 and thyroid tests. I will call her when is see the thyroid results.  Pertinent labs & EKG results that were available during my care of the patient were reviewed by me and considered in my medical decision making (see chart for details). For tonight I want her to take a dose of her BP medication, and not take the Advil pm, but take Melatonin 3mg  - 9 mg  and magnesium 250 mg instead.  Needs to take do BP diaries at the pharmacy and record them for her PCP. If ? 140/90, needs to take her BP med   Final Clinical Impressions(s) / UC Diagnoses   Final diagnoses:  Primary insomnia  Essential hypertension     Discharge Instructions     Tome Melatonin 3 mg con una pastilla de magnesio 250 mg a la hora de dormir. Puede tamar maximo 10 mg de melatonin si necesita.   Si su precion esta mas the 140/90, tome se remedio de precion.  Vaya a ver su midico la semana que viene.  Yo le llamo con los [place] de tiroides.     ED  Prescriptions    None     Controlled Substance Prescriptions [place] Controlled Substance Registry consulted?    Shelby [place], [place] 08/26/18 1830

## 2018-08-26 NOTE — Discharge Instructions (Signed)
Tome Melatonin 3 mg con una pastilla de magnesio 250 mg a la hora de dormir. Puede tamar maximo 10 mg de melatonin si necesita.   Si su precion esta mas the 140/90, tome se remedio de precion.  Vaya a ver su midico la semana que viene.  Yo le llamo con los [place] de tiroides.

## 2018-08-26 NOTE — ED Triage Notes (Signed)
Pt presents to [facility] for assessment of more than 1 week of not being able to go back to sleep when she wakes up at 1 or 2am every morning.  States her thoughts race after she can't go back to sleep.  Denies SI.  C/o some stress and depression issues but states "isn't that just normal for my age?".

## 2018-08-27 LAB — T3, FREE: T3, Free: 2.8 pg/mL (ref 2.0–4.4)

## 2018-08-29 ENCOUNTER — Encounter: Payer: 59 | Admitting: Family Medicine

## 2018-09-05 ENCOUNTER — Encounter: Payer: Self-pay | Admitting: Family Medicine

## 2018-09-05 ENCOUNTER — Ambulatory Visit (INDEPENDENT_AMBULATORY_CARE_PROVIDER_SITE_OTHER): Payer: 59 | Admitting: Family Medicine

## 2018-09-05 VITALS — BP 120/62 | HR 72 | Temp 97.8°F | Ht 61.5 in | Wt 151.2 lb

## 2018-09-05 DIAGNOSIS — Z0000 Encounter for general adult medical examination without abnormal findings: Secondary | ICD-10-CM

## 2018-09-05 DIAGNOSIS — Z23 Encounter for immunization: Secondary | ICD-10-CM

## 2018-09-05 MED ORDER — HALOBETASOL PROPIONATE 0.05 % EX CREA: TOPICAL_CREAM | Freq: Two times a day (BID) | CUTANEOUS | 2 refills | Status: DC

## 2018-09-05 NOTE — Progress Notes (Signed)
   Subjective:    Patient ID: [name] [name], female    DOB: 12-08-1955, 62 y.o.   MRN: [id number]  HPI                                                Review of Systems     Objective:   Physical Exam        Assessment & Plan:

## 2018-09-05 NOTE — Progress Notes (Signed)
   Subjective:    Patient ID: [name] [name], female    DOB: [birth year], 62 y.o.   MRN: [id number]  HPI Here for a well exam. She feels fine. She recently was seen for some insomnia and was told to take OTC melatonin. This has worked well for her.    Review of Systems  Constitutional: Negative.   HENT: Negative.   Eyes: Negative.   Respiratory: Negative.   Cardiovascular: Negative.   Gastrointestinal: Negative.   Genitourinary: Negative for decreased urine volume, difficulty urinating, dyspareunia, dysuria, enuresis, flank pain, frequency, hematuria, pelvic pain and urgency.  Musculoskeletal: Negative.   Skin: Negative.   Neurological: Negative.   Psychiatric/Behavioral: Negative.        Objective:   Physical Exam Constitutional:      General: She is not in acute distress.    Appearance: She is well-developed.  HENT:     Head: Normocephalic and atraumatic.     Right Ear: External ear normal.     Left Ear: External ear normal.     Nose: Nose normal.     Mouth/Throat:     Pharynx: No oropharyngeal exudate.  Eyes:     General: No scleral icterus.    Conjunctiva/sclera: Conjunctivae normal.     Pupils: Pupils are equal, round, and reactive to light.  Neck:     Musculoskeletal: Normal range of motion and neck supple.     Thyroid: No thyromegaly.     Vascular: No JVD.  Cardiovascular:     Rate and Rhythm: Normal rate and regular rhythm.     Heart sounds: Normal heart sounds. No murmur. No friction rub. No gallop.   Pulmonary:     Effort: Pulmonary effort is normal. No respiratory distress.     Breath sounds: Normal breath sounds. No wheezing or rales.  Chest:     Chest wall: No tenderness.  Abdominal:     General: Bowel sounds are normal. There is no distension.     Palpations: Abdomen is soft. There is no mass.     Tenderness: There is no abdominal tenderness. There is no guarding or rebound.  Musculoskeletal: Normal range of motion.        General: No tenderness.    Lymphadenopathy:     Cervical: No cervical adenopathy.  Skin:    General: Skin is warm and dry.     Findings: No erythema or rash.  Neurological:     Mental Status: She is alert and oriented to person, place, and time.     Cranial Nerves: No cranial nerve deficit.     Motor: No abnormal muscle tone.     Coordination: Coordination normal.     Deep Tendon Reflexes: Reflexes are normal and symmetric. Reflexes normal.  Psychiatric:        Behavior: Behavior normal.        Thought Content: Thought content normal.        Judgment: Judgment normal.           Assessment & Plan:  Well exam. We discussed diet and exercise. Get fasting labs soon.  Alysia Penna, MD

## 2018-09-09 ENCOUNTER — Other Ambulatory Visit (INDEPENDENT_AMBULATORY_CARE_PROVIDER_SITE_OTHER): Payer: 59

## 2018-09-09 DIAGNOSIS — Z0000 Encounter for general adult medical examination without abnormal findings: Secondary | ICD-10-CM

## 2018-09-09 LAB — POC URINALSYSI DIPSTICK (AUTOMATED)
Bilirubin, UA: NEGATIVE
Blood, UA: NEGATIVE
Glucose, UA: NEGATIVE
Ketones, UA: NEGATIVE
Leukocytes, UA: NEGATIVE
Nitrite, UA: NEGATIVE
Protein, UA: NEGATIVE
Spec Grav, UA: 1.025 (ref 1.010–1.025)
Urobilinogen, UA: 0.2 E.U./dL
pH, UA: 5.5 (ref 5.0–8.0)

## 2018-09-09 LAB — HEPATIC FUNCTION PANEL
ALT: 6 U/L (ref 0–35)
AST: 10 U/L (ref 0–37)
Albumin: 3.7 g/dL (ref 3.5–5.2)
Alkaline Phosphatase: 88 U/L (ref 39–117)
Bilirubin, Direct: 0.1 mg/dL (ref 0.0–0.3)
Total Bilirubin: 0.4 mg/dL (ref 0.2–1.2)
Total Protein: 6.6 g/dL (ref 6.0–8.3)

## 2018-09-09 LAB — CBC WITH DIFFERENTIAL/PLATELET
Basophils Absolute: 0 10*3/uL (ref 0.0–0.1)
Basophils Relative: 0.7 % (ref 0.0–3.0)
EOS ABS: 0.3 10*3/uL (ref 0.0–0.7)
Eosinophils Relative: 4.7 % (ref 0.0–5.0)
HEMATOCRIT: 38.1 % (ref 36.0–46.0)
Hemoglobin: 12.5 g/dL (ref 12.0–15.0)
Lymphocytes Relative: 31.5 % (ref 12.0–46.0)
Lymphs Abs: 1.7 10*3/uL (ref 0.7–4.0)
MCHC: 32.9 g/dL (ref 30.0–36.0)
MCV: 84.1 fl (ref 78.0–100.0)
Monocytes Absolute: 0.4 10*3/uL (ref 0.1–1.0)
Monocytes Relative: 7.6 % (ref 3.0–12.0)
Neutro Abs: 3.1 10*3/uL (ref 1.4–7.7)
Neutrophils Relative %: 55.5 % (ref 43.0–77.0)
PLATELETS: 295 10*3/uL (ref 150.0–400.0)
RBC: 4.53 Mil/uL (ref 3.87–5.11)
RDW: 15.3 % (ref 11.5–15.5)
WBC: 5.5 10*3/uL (ref 4.0–10.5)

## 2018-09-09 LAB — BASIC METABOLIC PANEL
BUN: 18 mg/dL (ref 6–23)
CO2: 27 mEq/L (ref 19–32)
Calcium: 8.8 mg/dL (ref 8.4–10.5)
Chloride: 105 mEq/L (ref 96–112)
Creatinine, Ser: 0.55 mg/dL (ref 0.40–1.20)
GFR: 119.48 mL/min (ref >60.00)
Glucose, Bld: 104 mg/dL — ABNORMAL HIGH (ref 70–99)
POTASSIUM: 4.1 meq/L (ref 3.5–5.1)
Sodium: 140 mEq/L (ref 135–145)

## 2018-09-09 LAB — LIPID PANEL
Cholesterol: 217 mg/dL — ABNORMAL HIGH (ref 0–200)
HDL: 57.7 mg/dL (ref >39.00)
LDL Cholesterol: 141 mg/dL — ABNORMAL HIGH (ref 0–99)
NonHDL: 158.86
Total CHOL/HDL Ratio: 4
Triglycerides: 89 mg/dL (ref 0.0–149.0)
VLDL: 17.8 mg/dL (ref 0.0–40.0)

## 2018-09-09 NOTE — Telephone Encounter (Signed)
Error

## 2018-10-18 ENCOUNTER — Ambulatory Visit: Payer: 59 | Admitting: Family Medicine

## 2018-10-18 ENCOUNTER — Encounter: Payer: Self-pay | Admitting: Family Medicine

## 2018-10-18 VITALS — BP 130/64 | HR 98 | Temp 98.9°F | Wt 153.8 lb

## 2018-10-18 DIAGNOSIS — A084 Viral intestinal infection, unspecified: Secondary | ICD-10-CM

## 2018-10-18 MED ORDER — DIPHENOXYLATE-ATROPINE 2.5-0.025 MG PO TABS: 2.0000 | ORAL_TABLET | Freq: Four times a day (QID) | ORAL | 0 refills | Status: DC | PRN

## 2018-10-18 MED ORDER — PROMETHAZINE HCL 25 MG PO TABS: 25.0000 mg | ORAL_TABLET | ORAL | 0 refills | Status: DC | PRN

## 2018-10-18 MED ORDER — PROMETHAZINE HCL 25 MG/ML IJ SOLN
25.0000 mg | Freq: Once | INTRAMUSCULAR | Status: AC
  Administered 2018-10-18: 25 mg via INTRAMUSCULAR

## 2018-10-18 NOTE — Progress Notes (Signed)
   Subjective:    Patient ID: [name] [name], female    DOB: September 20, 1955, 63 y.o.   MRN: [id number]  HPI Here for the onset last night of generalized abdominal cramps, diarrhea, nausea and vomiting. No fever. No recent travel.    Review of Systems  Constitutional: Negative.   HENT: Negative.   Respiratory: Negative.   Cardiovascular: Negative.   Gastrointestinal: Positive for abdominal pain, diarrhea, nausea and vomiting. Negative for abdominal distention and blood in stool.  Genitourinary: Negative.        Objective:   Physical Exam Constitutional:      Appearance: She is ill-appearing.  Cardiovascular:     Rate and Rhythm: Normal rate and regular rhythm.     Pulses: Normal pulses.     Heart sounds: Normal heart sounds.  Pulmonary:     Effort: Pulmonary effort is normal.     Breath sounds: Normal breath sounds.  Abdominal:     General: Abdomen is flat. Bowel sounds are normal. There is no distension.     Palpations: Abdomen is soft. There is no mass.     Tenderness: There is no guarding or rebound.     Hernia: No hernia is present.     Comments: Moderately tender in all 4 quadrants   Lymphadenopathy:     Cervical: No cervical adenopathy.  Neurological:     Mental Status: She is alert.           Assessment & Plan:  Viral enteritis, given a shot of Phenergan. She will use Phenergan tablets and Lomotil prn. Drink water and Gatorade. Written out of work today and tomorrow.  Alysia Penna, MD

## 2018-12-29 ENCOUNTER — Telehealth: Payer: Self-pay | Admitting: Family Medicine

## 2018-12-29 DIAGNOSIS — E119 Type 2 diabetes mellitus without complications: Secondary | ICD-10-CM

## 2018-12-29 NOTE — Telephone Encounter (Signed)
Called and spoke with pt and she is aware of lab appt on Monday at 8 am for the A1C

## 2018-12-29 NOTE — Telephone Encounter (Signed)
I ordered the A1c

## 2018-12-29 NOTE — Telephone Encounter (Signed)
Patient is wanting A1C checked but there aren't orders in the system for it.  She needs a call back about this.

## 2018-12-29 NOTE — Telephone Encounter (Signed)
Pt is wanting to have her A1C checked.  Please advise if she can come in for this or should she also have an OV.  Thanks

## 2019-01-02 ENCOUNTER — Other Ambulatory Visit: Payer: Self-pay

## 2019-01-02 ENCOUNTER — Other Ambulatory Visit (INDEPENDENT_AMBULATORY_CARE_PROVIDER_SITE_OTHER): Payer: 59

## 2019-01-02 DIAGNOSIS — E119 Type 2 diabetes mellitus without complications: Secondary | ICD-10-CM | POA: Diagnosis not present

## 2019-01-02 LAB — HEMOGLOBIN A1C: Hgb A1c MFr Bld: 6.8 % — ABNORMAL HIGH (ref 4.6–6.5)

## 2019-01-11 ENCOUNTER — Telehealth: Payer: Self-pay | Admitting: *Deleted

## 2019-01-11 NOTE — Telephone Encounter (Signed)
Copied from [place] [id number]. Topic: General - Other >> Jan 11, 2019  1:37 PM Alanda Slim E wrote: Reason for CRM: Pt called to get lab results and sugar levels. Office number busy after trying several times / please advise

## 2019-01-12 NOTE — Telephone Encounter (Signed)
Called and spoke with pt and she is aware of results.

## 2019-01-17 ENCOUNTER — Encounter: Payer: Self-pay | Admitting: Family Medicine

## 2019-01-17 ENCOUNTER — Ambulatory Visit (INDEPENDENT_AMBULATORY_CARE_PROVIDER_SITE_OTHER): Payer: 59 | Admitting: Family Medicine

## 2019-01-17 ENCOUNTER — Other Ambulatory Visit: Payer: Self-pay

## 2019-01-17 DIAGNOSIS — K5732 Diverticulitis of large intestine without perforation or abscess without bleeding: Secondary | ICD-10-CM | POA: Diagnosis not present

## 2019-01-17 MED ORDER — CIPROFLOXACIN HCL 500 MG PO TABS: 500.0000 mg | ORAL_TABLET | Freq: Two times a day (BID) | ORAL | 0 refills | Status: DC

## 2019-01-17 NOTE — Progress Notes (Signed)
Subjective:    Patient ID: [name] [name], female    DOB: 1956/08/31, 63 y.o.   MRN: [id number]  HPI Virtual Visit via Video Note  I connected with the patient on 01/17/19 at  9:30 AM EDT by a video enabled telemedicine application and verified that I am speaking with the correct person using two identifiers.  Location patient: home Location provider:work or home office Persons participating in the virtual visit: patient, provider  I discussed the limitations of evaluation and management by telemedicine and the availability of in person appointments. The patient expressed understanding and agreed to proceed.   HPI: Here for 10 days of mild pain in the left side and sometimes the left lower back. Her BMs have been regular. She passed a normal stool this morning. However she often feels like her bowels do not completely empty. No blood in the stools. No urinary symptoms. No fever or nausea. Appetite is good. She has never had diverticulitis, but her colonoscopy in 2013 did show left sided diverticulosis.    ROS: See pertinent positives and negatives per HPI.  Past Medical History:  Diagnosis Date  . Diabetes mellitus without complication ([place])   . Ruptured ovarian cyst     Past Surgical History:  Procedure Laterality Date  . CESAREAN SECTION    . COLONOSCOPY  11-25-11   per Dr. Sharlett Iles, diverticulosis only, repeat in 10 yrs   . lumpectomy,benign, right breast     x2    Family History  Problem Relation Age of Onset  . Cancer Other        breast cancer  . Colon cancer Neg Hx   . Esophageal cancer Neg Hx   . Stomach cancer Neg Hx   . Rectal cancer Neg Hx      Current Outpatient Medications:  .  diphenoxylate-atropine (LOMOTIL) 2.5-0.025 MG tablet, Take 2 tablets by mouth 4 (four) times daily as needed for diarrhea or loose stools., Disp: 60 tablet, Rfl: 0 .  halobetasol (ULTRAVATE) 0.05 % cream, Apply topically 2 (two) times daily., Disp: 50 g, Rfl: 2 .  Melatonin 3 MG  TABS, Taking 3 tablets by mouth at bedtime as needed., Disp: , Rfl:  .  promethazine (PHENERGAN) 25 MG tablet, Take 1 tablet (25 mg total) by mouth every 4 (four) hours as needed for nausea or vomiting., Disp: 30 tablet, Rfl: 0 .  ciprofloxacin (CIPRO) 500 MG tablet, Take 1 tablet (500 mg total) by mouth 2 (two) times daily., Disp: 20 tablet, Rfl: 0  EXAM:  VITALS per patient if applicable:  GENERAL: alert, oriented, appears well and in no acute distress  HEENT: atraumatic, conjunttiva clear, no obvious abnormalities on inspection of external nose and ears  NECK: normal movements of the head and neck  LUNGS: on inspection no signs of respiratory distress, breathing rate appears normal, no obvious gross SOB, gasping or wheezing  CV: no obvious cyanosis  MS: moves all visible extremities without noticeable abnormality  PSYCH/NEURO: pleasant and cooperative, no obvious depression or anxiety, speech and thought processing grossly intact  ASSESSMENT AND PLAN: This is consistent with early diverticulitis. We will treat with Cipro for 10 days. She will drink plenty of water and I suggested she begin using Miralax every day. Recheck prn. Alysia Penna, MD  Discussed the following assessment and plan:  No diagnosis found.     I discussed the assessment and treatment plan with the patient. The patient was provided an opportunity to ask questions and all were answered.  The patient agreed with the plan and demonstrated an understanding of the instructions.   The patient was advised to call back or seek an in-person evaluation if the symptoms worsen or if the condition fails to improve as anticipated.     Review of Systems     Objective:   Physical Exam        Assessment & Plan:

## 2019-01-31 ENCOUNTER — Ambulatory Visit: Payer: 59 | Admitting: Family Medicine

## 2019-01-31 ENCOUNTER — Other Ambulatory Visit: Payer: Self-pay

## 2019-01-31 ENCOUNTER — Encounter: Payer: Self-pay | Admitting: Family Medicine

## 2019-01-31 VITALS — BP 120/64 | HR 81 | Temp 98.3°F | Wt 157.4 lb

## 2019-01-31 DIAGNOSIS — R1012 Left upper quadrant pain: Secondary | ICD-10-CM | POA: Diagnosis not present

## 2019-01-31 NOTE — Progress Notes (Signed)
   Subjective:    Patient ID: [name] [name], female    DOB: 07-Mar-1956, 63 y.o.   MRN: [id number]  HPI Here to follow up on left sided abdominal pain that started about 3 weeks ago. This pain is sharp in nature and it comes and goes. She has had no fever at all. No urinary symptoms. Her BMs have been normal in consistency and frequency, the last one being this morning. She notes that when she passes a stool she often gets brief relief from the pain. She has been nauseated at times but has not vomited. Her appetite is normal. The pain usually starts in the LUQ just beneath the ribs and it then radiates around the left flank. No back pain. We had a virtual visit with her on 01-17-19 and felt she may have diverticulitis. She was given a 10 day course of Cipro, and she did feel better while she was taking this, though the pain never subsided completely. Now the pain is back like before.    Review of Systems  Constitutional: Negative.   Respiratory: Negative.   Cardiovascular: Negative.   Gastrointestinal: Positive for abdominal pain and nausea. Negative for abdominal distention, anal bleeding, blood in stool, constipation, diarrhea, rectal pain and vomiting.  Genitourinary: Negative.   Musculoskeletal: Negative for back pain.       Objective:   Physical Exam Constitutional:      General: She is not in acute distress.    Appearance: Normal appearance.  Cardiovascular:     Rate and Rhythm: Normal rate and regular rhythm.     Pulses: Normal pulses.     Heart sounds: Normal heart sounds.  Pulmonary:     Effort: Pulmonary effort is normal.     Breath sounds: Normal breath sounds.  Abdominal:     General: Abdomen is flat. Bowel sounds are normal. There is no distension.     Palpations: Abdomen is soft. There is no mass.     Tenderness: There is no right CVA tenderness, left CVA tenderness, guarding or rebound.     Hernia: No hernia is present.     Comments: She is mildly tender in the LUQ just  inferior to the ribs and also around the left flank   Neurological:     Mental Status: She is alert.           Assessment & Plan:  LUQ pain suggestive of either partially treated diverticulitis or pancreatitis. Constipation is less likely. She will drink plenty of fluids and avoid fatty foods. Get labs including a UA. Set up a contrasted CT of the abdomen and pelvis soon.  Alysia Penna, MD

## 2019-02-02 ENCOUNTER — Other Ambulatory Visit: Payer: Self-pay

## 2019-02-02 ENCOUNTER — Other Ambulatory Visit (INDEPENDENT_AMBULATORY_CARE_PROVIDER_SITE_OTHER): Payer: 59

## 2019-02-02 DIAGNOSIS — R1012 Left upper quadrant pain: Secondary | ICD-10-CM

## 2019-02-02 LAB — HEPATIC FUNCTION PANEL
ALT: 7 U/L (ref 0–35)
AST: 16 U/L (ref 0–37)
Albumin: 3.9 g/dL (ref 3.5–5.2)
Alkaline Phosphatase: 101 U/L (ref 39–117)
Bilirubin, Direct: 0.1 mg/dL (ref 0.0–0.3)
Total Bilirubin: 0.4 mg/dL (ref 0.2–1.2)
Total Protein: 7.1 g/dL (ref 6.0–8.3)

## 2019-02-02 LAB — CBC WITH DIFFERENTIAL/PLATELET
Basophils Absolute: 0.1 10*3/uL (ref 0.0–0.1)
Basophils Relative: 0.9 % (ref 0.0–3.0)
Eosinophils Absolute: 0.2 10*3/uL (ref 0.0–0.7)
Eosinophils Relative: 2.6 % (ref 0.0–5.0)
HCT: 36.4 % (ref 36.0–46.0)
Hemoglobin: 12.4 g/dL (ref 12.0–15.0)
Lymphocytes Relative: 26.9 % (ref 12.0–46.0)
Lymphs Abs: 2.2 10*3/uL (ref 0.7–4.0)
MCHC: 33.9 g/dL (ref 30.0–36.0)
MCV: 83.1 fl (ref 78.0–100.0)
Monocytes Absolute: 0.5 10*3/uL (ref 0.1–1.0)
Monocytes Relative: 5.9 % (ref 3.0–12.0)
Neutro Abs: 5.1 10*3/uL (ref 1.4–7.7)
Neutrophils Relative %: 63.7 % (ref 43.0–77.0)
Platelets: 308 10*3/uL (ref 150.0–400.0)
RBC: 4.38 Mil/uL (ref 3.87–5.11)
RDW: 14.2 % (ref 11.5–15.5)
WBC: 8.1 10*3/uL (ref 4.0–10.5)

## 2019-02-02 LAB — BASIC METABOLIC PANEL
BUN: 25 mg/dL — ABNORMAL HIGH (ref 6–23)
CO2: 27 mEq/L (ref 19–32)
Calcium: 8.8 mg/dL (ref 8.4–10.5)
Chloride: 105 mEq/L (ref 96–112)
Creatinine, Ser: 0.68 mg/dL (ref 0.40–1.20)
GFR: 87.88 mL/min (ref >60.00)
Glucose, Bld: 118 mg/dL — ABNORMAL HIGH (ref 70–99)
Potassium: 3.9 mEq/L (ref 3.5–5.1)
Sodium: 140 mEq/L (ref 135–145)

## 2019-02-02 LAB — POC URINALSYSI DIPSTICK (AUTOMATED)
Bilirubin, UA: NEGATIVE
Blood, UA: NEGATIVE
Glucose, UA: NEGATIVE
Ketones, UA: NEGATIVE
Leukocytes, UA: NEGATIVE
Nitrite, UA: NEGATIVE
Protein, UA: NEGATIVE
Spec Grav, UA: >=1.03 — AB (ref 1.010–1.025)
Urobilinogen, UA: 0.2 E.U./dL
pH, UA: 5 (ref 5.0–8.0)

## 2019-02-02 LAB — AMYLASE: Amylase: 41 U/L (ref 27–131)

## 2019-02-02 LAB — LIPASE: Lipase: 16 U/L (ref 11.0–59.0)

## 2019-02-08 ENCOUNTER — Other Ambulatory Visit: Payer: Self-pay | Admitting: Family Medicine

## 2019-02-09 ENCOUNTER — Other Ambulatory Visit: Payer: Self-pay | Admitting: Family Medicine

## 2019-02-13 ENCOUNTER — Telehealth: Payer: Self-pay

## 2019-02-13 NOTE — Telephone Encounter (Signed)
Copied from [place] [id number]. Topic: General - Other >> Feb 10, 2019  3:32 PM Leward Quan A wrote: Reason for CRM: Patient called to fin d out what her test results are from labs done 10 days ago. Ph# [phone]

## 2019-02-13 NOTE — Telephone Encounter (Signed)
Pt is aware of results. 

## 2019-02-13 NOTE — Telephone Encounter (Signed)
Dr. Fry please advise on refill. thanks 

## 2019-02-16 ENCOUNTER — Ambulatory Visit
Admission: RE | Admit: 2019-02-16 | Discharge: 2019-02-16 | Disposition: A | Discharge status: Home / Self Care | Payer: 59 | Source: Ambulatory Visit | Attending: Family Medicine | Admitting: Family Medicine

## 2019-02-16 ENCOUNTER — Other Ambulatory Visit: Payer: Self-pay

## 2019-02-16 DIAGNOSIS — R1012 Left upper quadrant pain: Secondary | ICD-10-CM

## 2019-02-16 MED ORDER — IOPAMIDOL (ISOVUE-300) INJECTION 61%
100.0000 mL | Freq: Once | INTRAVENOUS | Status: AC | PRN
  Administered 2019-02-16: 100 mL via INTRAVENOUS

## 2019-02-17 NOTE — Addendum Note (Signed)
Addended by: Alysia Penna A on: 02/17/2019 08:07 AM   Modules accepted: Orders

## 2019-03-01 ENCOUNTER — Ambulatory Visit (INDEPENDENT_AMBULATORY_CARE_PROVIDER_SITE_OTHER): Payer: 59 | Admitting: Family Medicine

## 2019-03-01 ENCOUNTER — Other Ambulatory Visit: Payer: Self-pay

## 2019-03-01 ENCOUNTER — Encounter: Payer: Self-pay | Admitting: Family Medicine

## 2019-03-01 DIAGNOSIS — R1010 Upper abdominal pain, unspecified: Secondary | ICD-10-CM

## 2019-03-01 NOTE — Progress Notes (Signed)
   Subjective:    Patient ID: [name] [name], female    DOB: Jan 08, 1956, 63 y.o.   MRN: [id number]  HPI Virtual Visit via Telephone Note  I connected with the patient on 03/01/19 at  3:45 PM EDT by telephone and verified that I am speaking with the correct person using two identifiers. We attempted to connect virtually but we had technical difficulties with the audio and video.    I discussed the limitations, risks, security and privacy concerns of performing an evaluation and management service by telephone and the availability of in person appointments. I also discussed with the patient that there may be a patient responsible charge related to this service. The patient expressed understanding and agreed to proceed.  Location patient: home Location provider: work or home office Participants present for the call: patient, provider Patient did not have a visit in the prior 7 days to address this/these issue(s).   History of Present Illness: She is calling because of continued upper abdominal pain. She was seen here on 01-29-19 for upper abdominal pain and decreased appetite. No fevers. Her BMs were normal. She had labs drawn that day which were all normal. Then on 02-16-19 she had an abdominal CT done which revealed her gall bladder to be full of stones. We referred her to Surgery to be evaluated, but she says no one has contacted her yet from the Surgery office. Her symptoms have continued every day since we saw her.    Observations/Objective: Patient sounds cheerful and well on the phone. I do not appreciate any SOB. Speech and thought processing are grossly intact. Patient reported vitals:  Assessment and Plan: Upper abdominal pain, most likely from gallstones. I assured her we will contact the Surgery office tomorrow and make sure they reach out to her to make an appointment asap. She will let us know if anything changes.  Alysia Penna, MD   Follow Up Instructions:     [phone] 5-10  [phone] 11-20 [id number] 21-30 I did not refer this patient for an OV in the next 24 hours for this/these issue(s).  I discussed the assessment and treatment plan with the patient. The patient was provided an opportunity to ask questions and all were answered. The patient agreed with the plan and demonstrated an understanding of the instructions.   The patient was advised to call back or seek an in-person evaluation if the symptoms worsen or if the condition fails to improve as anticipated.  I provided 14 minutes of non-face-to-face time during this encounter.   Alysia Penna, MD    Review of Systems     Objective:   Physical Exam        Assessment & Plan:

## 2019-04-09 NOTE — Progress Notes (Signed)
Referring Provider: Michael Boston, MD Primary Care Physician:  Laurey Morale, MD  Reason for Consultation:  Abdominal pain   IMPRESSION:  Abdominal pain primarily in the left upper quadrant and flank, occasionally in the right lower and left lower quadrants    - CT abd/pelvis with contrast 02/16/19: sigmoid diverticulosis; cholelithiasis; no acute abnormality seen Gallstones without associated biliary colic Reflux with intermittent solid food dysphasia Constipation with daily BM Diverticulosis Normal screening colonoscopy 2013 Sharlett Iles)  Etiology of symptoms is unclear but by description is unlikely to be biliary colic.  EGD and colonoscopy recommended.     PLAN: Trial of pantoprazole 40 mg QAM Trial of Metamucil daily for stool bulking properties EGD and colonoscopy   HPI: [name] [name] is a 63 y.o. female referred by Dr. Johney Maine for abdominal pain, bowel changes, and possible stricture.  The history is obtained to the patient, review of her electronic health record, and records provided by Dr. Johney Maine.  She has diabetes.  Left upper quadrant and left flank pain developed in May. Occasionally has right lower quadrant and left lower quadrant pain.  Previousy had one BM daily. Now having up to 5 BM daily. No blood or mucous in the stool. No nausea or vomiting. Post-prandial diarrhea that relieves her symptoms. Worsened by greasy foods. Previousy with significant flatus but flatus has resolved with the onset of this pain. Feels like something is under her ribs. Great appetite. Initially lost some weight with changes in her diet - removed Coke, stopped eating at night, starting walking and bicyclying regularly.   She was empirically treated with 10 days of Cipro for presumed diverticulitis.  Then on 02-16-19, after completing the antibiotics, she had an abdominal CT done which revealed cholelithiasis.  She was referred to surgery. Dr. Johney Maine felt that etiologies other than biliary colic  should be considered and referred her to GI.   She also reports intermittent dysphasia to solids that occurs monthy.  She has chronic heartburn.  There is occasional nausea but no emesis.  However, she initially presented with severe nausea and vomiting at the onset of her abdominal pain.  Her nausea and vomiting improved with antibiotics but the pain persists.  It is now been lingering for months.  There is some abdominal wall soreness when she sits or she coughs or if she walks for long periods.  She has a history of constipation but has a bowel movement most days.  She denies the use of NSAIDs.   Labs 02/02/19: normal CBC, normal liver enzymes, normal lipase  CT ab/pelvis with contrast 02/16/2019 1. No acute intra-abdominal abnormality detected. No findings to explain the patient's left upper quadrant abdominal pain. 2. Cholelithiasis without CT evidence of acute cholecystitis. 3. Sigmoid diverticulosis without CT evidence of diverticulitis.  Screening colonoscopy 11/25/11 with Dr. Sharlett Iles: diverticulosis  No family history of colon cancer, colon polyps, pancreatic cancer, gastric cancer, or uterine/endometrial. Multiple family members with breast cancer.   Past Medical History:  Diagnosis Date  . Diabetes mellitus without complication ([place])   . Ruptured ovarian cyst     Past Surgical History:  Procedure Laterality Date  . CESAREAN SECTION    . COLONOSCOPY  11-25-11   per Dr. Sharlett Iles, diverticulosis only, repeat in 10 yrs   . lumpectomy,benign, right breast     x2    Current Outpatient Medications  Medication Sig Dispense Refill  . cyclobenzaprine (FLEXERIL) 10 MG tablet TAKE 1 TABLET BY MOUTH 3 TIMES DAILY AS NEEDED FOR MUSCLE  SPASMS 90 tablet 2  . diphenoxylate-atropine (LOMOTIL) 2.5-0.025 MG tablet Take 2 tablets by mouth 4 (four) times daily as needed for diarrhea or loose stools. 60 tablet 0  . halobetasol (ULTRAVATE) 0.05 % cream APPLY TOPICALLY TWO TIMES A DAY 50 g 0   . Melatonin 3 MG TABS Taking 3 tablets by mouth at bedtime as needed.    . promethazine (PHENERGAN) 25 MG tablet Take 1 tablet (25 mg total) by mouth every 4 (four) hours as needed for nausea or vomiting. 30 tablet 0  . pantoprazole (PROTONIX) 40 MG tablet Take 1 tablet (40 mg total) by mouth daily. 90 tablet 0   No current facility-administered medications for this visit.     Allergies as of 04/10/2019 - Review Complete 04/10/2019  Allergen Reaction Noted  . Hydrocodone-homatropine Nausea And Vomiting 08/24/2014    Family History  Problem Relation Age of Onset  . Cancer Other        breast cancer  . Colon cancer Neg Hx   . Esophageal cancer Neg Hx   . Stomach cancer Neg Hx   . Rectal cancer Neg Hx     Social History   Socioeconomic History  . Marital status: Married    Spouse name: Not on file  . Number of children: Not on file  . Years of education: Not on file  . Highest education level: Not on file  Occupational History  . Not on file  Social Needs  . Financial resource strain: Not on file  . Food insecurity    Worry: Not on file    Inability: Not on file  . Transportation needs    Medical: Not on file    Non-medical: Not on file  Tobacco Use  . Smoking status: Never Smoker  . Smokeless tobacco: Never Used  Substance and Sexual Activity  . Alcohol use: Yes    Alcohol/week: 0.0 standard drinks    Comment: maybe twice a month  . Drug use: No  . Sexual activity: Not on file  Lifestyle  . Physical activity    Days per week: Not on file    Minutes per session: Not on file  . Stress: Not on file  Relationships  . Social Herbalist on phone: Not on file    Gets together: Not on file    Attends religious service: Not on file    Active member of club or organization: Not on file    Attends meetings of clubs or organizations: Not on file    Relationship status: Not on file  . Intimate partner violence    Fear of current or ex partner: Not on file     Emotionally abused: Not on file    Physically abused: Not on file    Forced sexual activity: Not on file  Other Topics Concern  . Not on file  Social History Narrative   Married   Never Smoker   Alcohol use-yes   Drug use-no          Review of Systems: 12 system ROS is negative except as noted above.   Physical Exam: General:   Alert,  well-nourished, pleasant and cooperative in NAD Head:  Normocephalic and atraumatic. Eyes:  Sclera clear, no icterus.   Conjunctiva pink. Ears:  Normal auditory acuity. Nose:  No deformity, discharge,  or lesions. Mouth:  No deformity or lesions.   Neck:  Supple; no masses or thyromegaly. Lungs:  Clear throughout to auscultation.  No wheezes. Heart:  Regular rate and rhythm; no murmurs. Abdomen:  Soft,nontender, nondistended, normal bowel sounds, no rebound or guarding. No hepatosplenomegaly.   Rectal:  Deferred  Msk:  Symmetrical. No boney deformities LAD: No inguinal or umbilical LAD Extremities:  No clubbing or edema. Neurologic:  Alert and  oriented x4;  grossly nonfocal Skin:  Intact without significant lesions or rashes. Psych:  Alert and cooperative. Normal mood and affect.     Alina Gilkey L. Tarri Glenn, MD, MPH 04/11/2019, 8:01 AM

## 2019-04-10 ENCOUNTER — Telehealth: Payer: Self-pay | Admitting: Gastroenterology

## 2019-04-10 ENCOUNTER — Ambulatory Visit: Payer: Managed Care, Other (non HMO) | Admitting: Gastroenterology

## 2019-04-10 ENCOUNTER — Encounter: Payer: Self-pay | Admitting: Gastroenterology

## 2019-04-10 VITALS — BP 112/64 | HR 80 | Temp 97.3°F | Ht 61.0 in | Wt 155.0 lb

## 2019-04-10 DIAGNOSIS — R109 Unspecified abdominal pain: Secondary | ICD-10-CM | POA: Diagnosis not present

## 2019-04-10 DIAGNOSIS — K5731 Diverticulosis of large intestine without perforation or abscess with bleeding: Secondary | ICD-10-CM | POA: Diagnosis not present

## 2019-04-10 DIAGNOSIS — R1310 Dysphagia, unspecified: Secondary | ICD-10-CM

## 2019-04-10 MED ORDER — OMEPRAZOLE 40 MG PO CPDR: 40.0000 mg | DELAYED_RELEASE_CAPSULE | Freq: Every day | ORAL | 1 refills | Status: DC

## 2019-04-10 MED ORDER — NA SULFATE-K SULFATE-MG SULF 17.5-3.13-1.6 GM/177ML PO SOLN: 1.0000 | Freq: Once | ORAL | 0 refills | Status: AC

## 2019-04-10 MED ORDER — PANTOPRAZOLE SODIUM 40 MG PO TBEC: 40.0000 mg | DELAYED_RELEASE_TABLET | Freq: Every day | ORAL | 0 refills | Status: DC

## 2019-04-10 NOTE — Patient Instructions (Addendum)
I have recommended an upper endoscopy and colonoscopy for further evaluation.  I recommend that you use Metamucil daily between now and your endoscopy.  I also recommend that you start pantoprazole 40 mg every morning to try to help with the heartburn and swallowing.   You have been scheduled for an endoscopy and colonoscopy. Please follow the written instructions given to you at your visit today. Please pick up your prep supplies at the pharmacy within the next 1-3 days. If you use inhalers (even only as needed), please bring them with you on the day of your procedure.

## 2019-04-10 NOTE — Telephone Encounter (Signed)
Please advise which prescription is best.

## 2019-04-10 NOTE — Telephone Encounter (Signed)
Rx sent to pharmacy   

## 2019-04-10 NOTE — Telephone Encounter (Signed)
[facility] pharmacy called in reference to pantoprazole, insurance does not cover it, they cover the following OTC with prescription: Omeprazole 20 mg, quantity of 42 Esomeprazoel 20 mg, quantity of 42  Please send prescription for either one.

## 2019-04-10 NOTE — Telephone Encounter (Signed)
Spoke to pharmacist she is going to take 2 of the OTC 20 mg Omeprazole in the 90 day containers at [facility]. 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

## 2019-04-10 NOTE — Telephone Encounter (Signed)
[name] [name],   This patient prefers spanish- would you please let her know that we have switched her prescription to Omeprazole 40 mg daily and that was sent into her pharmacy. And we will very likely have samples of suprep if she wants to call about a week before her procedure to request a sample.

## 2019-04-10 NOTE — Telephone Encounter (Signed)
[facility] called regarding omeprazole. They only cover the OTC that is 20mg  tablet. It comes in a box of 42.  They would like to know if Dr. Tarri Glenn can send a new Rx for 90 days so that the insurance covers it.

## 2019-04-10 NOTE — Telephone Encounter (Signed)
Omeprazole 40 mg daily. Thank you.

## 2019-04-10 NOTE — Telephone Encounter (Signed)
[name] [name], I spoke with patient and gave her the information about omeprazole and suprep.

## 2019-04-17 ENCOUNTER — Telehealth: Payer: Self-pay | Admitting: Gastroenterology

## 2019-04-17 NOTE — Telephone Encounter (Signed)
Patient aware that she can pick up sample of suprep, pharmacy made aware.

## 2019-04-27 ENCOUNTER — Telehealth: Payer: Self-pay | Admitting: Gastroenterology

## 2019-04-27 NOTE — Telephone Encounter (Signed)
[facility] pharmacy stated that pt's insurance does not cover Suprep--PEG is covered.

## 2019-04-27 NOTE — Telephone Encounter (Signed)
Please help the patient get her approved prep. Thank you.

## 2019-04-27 NOTE — Telephone Encounter (Signed)
Spoke with pharmacy, had them to cancel the Rx patient aware that she can pick up sample for the procedure.

## 2019-05-05 ENCOUNTER — Other Ambulatory Visit: Payer: Self-pay | Admitting: Family Medicine

## 2019-05-29 ENCOUNTER — Encounter: Payer: Self-pay | Admitting: Gastroenterology

## 2019-06-02 ENCOUNTER — Telehealth: Payer: Self-pay | Admitting: Gastroenterology

## 2019-06-02 NOTE — Telephone Encounter (Signed)
Covid-19 screening questions   Do you now or have you had a fever in the last 14 days?  Do you have any respiratory symptoms of shortness of breath or cough now or in the last 14 days?  Do you have any family members or close contacts with diagnosed or suspected Covid-19 in the past 14 days?  Have you been tested for Covid-19 and found to be positive?       

## 2019-06-02 NOTE — Telephone Encounter (Signed)
Patient called back and answered 'NO" to screening questions. °

## 2019-06-05 ENCOUNTER — Other Ambulatory Visit: Payer: Self-pay

## 2019-06-05 ENCOUNTER — Ambulatory Visit (AMBULATORY_SURGERY_CENTER): Payer: Managed Care, Other (non HMO) | Admitting: Gastroenterology

## 2019-06-05 ENCOUNTER — Encounter: Payer: Self-pay | Admitting: Gastroenterology

## 2019-06-05 VITALS — BP 106/44 | HR 71 | Temp 98.3°F | Resp 12 | Ht 61.0 in | Wt 155.0 lb

## 2019-06-05 DIAGNOSIS — R1310 Dysphagia, unspecified: Secondary | ICD-10-CM

## 2019-06-05 DIAGNOSIS — D122 Benign neoplasm of ascending colon: Secondary | ICD-10-CM | POA: Diagnosis not present

## 2019-06-05 DIAGNOSIS — K2970 Gastritis, unspecified, without bleeding: Secondary | ICD-10-CM | POA: Diagnosis not present

## 2019-06-05 DIAGNOSIS — K2990 Gastroduodenitis, unspecified, without bleeding: Secondary | ICD-10-CM

## 2019-06-05 DIAGNOSIS — R109 Unspecified abdominal pain: Secondary | ICD-10-CM

## 2019-06-05 HISTORY — PX: COLONOSCOPY: SHX174

## 2019-06-05 MED ORDER — SODIUM CHLORIDE 0.9 % IV SOLN: 500.0000 mL | Freq: Once | INTRAVENOUS | Status: DC

## 2019-06-05 NOTE — Op Note (Signed)
[facility] Patient Name: Franklin Mcneish Procedure Date: 06/05/2019 7:56 AM MRN: [id number] Endoscopist: Thornton Park MD, MD Age: 63 Referring MD:  Date of Birth: 07-15-1956 Gender: Female Account #: [id number] Procedure:                Upper GI endoscopy Indications:              Abdominal pain primarily in the left upper quadrant                            and flank, occasionally in the right lower and left                            lower quadrants                           - CT abd/pelvis with contrast 02/16/19: sigmoid                            diverticulosis; cholelithiasis; no acute                            abnormality seen                           Reflux with intermittent solid food dysphasia Medicines:                See the Anesthesia note for documentation of the                            administered medications Procedure:                Pre-Anesthesia Assessment:                           - Prior to the procedure, a History and Physical                            was performed, and patient medications and                            allergies were reviewed. The patient's tolerance of                            previous anesthesia was also reviewed. The risks                            and benefits of the procedure and the sedation                            options and risks were discussed with the patient.                            All questions were answered, and informed consent  was obtained. Prior Anticoagulants: The patient has                            taken no previous anticoagulant or antiplatelet                            agents. ASA Grade Assessment: II - A patient with                            mild systemic disease. After reviewing the risks                            and benefits, the patient was deemed in                            satisfactory condition to undergo the procedure.                           After  obtaining informed consent, the endoscope was                            passed under direct vision. Throughout the                            procedure, the patient's blood pressure, pulse, and                            oxygen saturations were monitored continuously. The                            Endoscope was introduced through the mouth, and                            advanced to the third part of duodenum. The upper                            GI endoscopy was accomplished without difficulty.                            The patient tolerated the procedure well. Scope In: Scope Out: Findings:                 The examined esophagus was normal. Biopsies were                            taken from the distal esophagus and the mid and                            proximal esophagus with a cold forceps for                            histology. Estimated blood loss was minimal. No  ring, web, or stricture seen.                           Patchy mildly erythematous mucosa without bleeding                            was found in the gastric body. Biopsies were taken                            from the antrum and fundus. with a cold forceps for                            histology. Estimated blood loss was minimal.                           The examined duodenum was normal. Biopsies were                            taken with a cold forceps for histology. Estimated                            blood loss was minimal.                           The cardia and gastric fundus were normal on                            retroflexion.                           The exam was otherwise without abnormality. Complications:            No immediate complications. Estimated blood loss:                            Minimal. Estimated Blood Loss:     Estimated blood loss was minimal. Impression:               - Normal esophagus. No obvious cause of dysphagia                             identified on this study. Biopsied.                           - Erythematous mucosa in the gastric body. Biopsied.                           - Normal examined duodenum. Biopsied.                           - The examination was otherwise normal. Recommendation:           - Patient has a contact number available for                            emergencies. The signs and symptoms of potential  delayed complications were discussed with the                            patient. Return to normal activities tomorrow.                            Written discharge instructions were provided to the                            patient.                           - Resume previous diet today.                           - Continue present medications.                           - Await pathology results.                           - Proceed with colonoscopy as previously planned. Thornton Park MD, MD 06/05/2019 8:35:56 AM This report has been signed electronically.

## 2019-06-05 NOTE — Progress Notes (Signed)
Temp check by KA/vital check by CW.  Reviewed medical and surgical history with the patient.

## 2019-06-05 NOTE — Patient Instructions (Signed)
Impression/Recommendations:  [place]yp handout given to patient. Diverticulosis handout given to patient.  Resume previous diet. Continue present medications. Await pathology results.  Repeat colonoscopy date to be determined after pathology results reviewed.  Follow-up in GI office to review results and make further recommendations.  YOU HAD AN ENDOSCOPIC PROCEDURE TODAY AT [facility]:   Refer to the procedure report that was given to you for any specific questions about what was found during the examination.  If the procedure report does not answer your questions, please call your gastroenterologist to clarify.  If you requested that your care partner not be given the details of your procedure findings, then the procedure report has been included in a sealed envelope for you to review at your convenience later.  YOU SHOULD EXPECT: Some feelings of bloating in the abdomen. Passage of more gas than usual.  Walking can help get rid of the air that was put into your GI tract during the procedure and reduce the bloating. If you had a lower endoscopy (such as a colonoscopy or flexible sigmoidoscopy) you may notice spotting of blood in your stool or on the toilet paper. If you underwent a bowel prep for your procedure, you may not have a normal bowel movement for a few days.  Please Note:  You might notice some irritation and congestion in your nose or some drainage.  This is from the oxygen used during your procedure.  There is no need for concern and it should clear up in a day or so.  SYMPTOMS TO REPORT IMMEDIATELY:   Following lower endoscopy (colonoscopy or flexible sigmoidoscopy):  Excessive amounts of blood in the stool  Significant tenderness or worsening of abdominal pains  Swelling of the abdomen that is new, acute  Fever of 100F or higher   Following upper endoscopy (EGD)  Vomiting of blood or coffee ground material  New chest pain or pain under the shoulder  blades  Painful or persistently difficult swallowing  New shortness of breath  Fever of 100F or higher  Black, tarry-looking stools  For urgent or emergent issues, a gastroenterologist can be reached at any hour by calling [phone].   DIET:  We do recommend a small meal at first, but then you may proceed to your regular diet.  Drink plenty of fluids but you should avoid alcoholic beverages for 24 hours.  ACTIVITY:  You should plan to take it easy for the rest of today and you should NOT DRIVE or use heavy machinery until tomorrow (because of the sedation medicines used during the test).    FOLLOW UP: Our staff will call the number listed on your records 48-72 hours following your procedure to check on you and address any questions or concerns that you may have regarding the information given to you following your procedure. If we do not reach you, we will leave a message.  We will attempt to reach you two times.  During this call, we will ask if you have developed any symptoms of COVID 19. If you develop any symptoms (ie: fever, flu-like symptoms, shortness of breath, cough etc.) before then, please call [phone].  If you test positive for Covid 19 in the 2 weeks post procedure, please call and report this information to [place].    If any biopsies were taken you will be contacted by phone or by letter within the next 1-3 weeks.  Please call us at [phone] if you have not heard about the biopsies  in 3 weeks.    SIGNATURES/CONFIDENTIALITY: You and/or your care partner have signed paperwork which will be entered into your electronic medical record.  These signatures attest to the fact that that the information above on your After Visit Summary has been reviewed and is understood.  Full responsibility of the confidentiality of this discharge information lies with you and/or your care-partner.

## 2019-06-05 NOTE — Progress Notes (Signed)
Report given to PACU, vss 

## 2019-06-05 NOTE — Progress Notes (Signed)
Called to room to assist during endoscopic procedure.  Patient ID and intended procedure confirmed with present staff. Received instructions for my participation in the procedure from the performing physician.  

## 2019-06-05 NOTE — Op Note (Signed)
[facility] Patient Name: [name] [name] Procedure Date: 06/05/2019 7:55 AM MRN: [id number] Endoscopist: Thornton Park MD, MD Age: 63 Referring MD:  Date of Birth: 1955-10-27 Gender: Female Account #: [id number] Procedure:                Colonoscopy Indications:              Abdominal pain primarily in the left upper quadrant                            and flank, occasionally in the right lower and left                            lower quadrants                           - CT abd/pelvis with contrast 02/16/19: sigmoid                            diverticulosis; cholelithiasis; no acute                            abnormality seen                           Constipation with daily BM                           Diverticulosis                           Normal screening colonoscopy 2013 Sharlett Iles) Medicines:                See the Anesthesia note for documentation of the                            administered medications Procedure:                Pre-Anesthesia Assessment:                           - Prior to the procedure, a History and Physical                            was performed, and patient medications and                            allergies were reviewed. The patient's tolerance of                            previous anesthesia was also reviewed. The risks                            and benefits of the procedure and the sedation                            options and risks were discussed with the patient.  All questions were answered, and informed consent                            was obtained. Prior Anticoagulants: The patient has                            taken no previous anticoagulant or antiplatelet                            agents. ASA Grade Assessment: II - A patient with                            mild systemic disease. After reviewing the risks                            and benefits, the patient was deemed in        satisfactory condition to undergo the procedure.                           After obtaining informed consent, the colonoscope                            was passed under direct vision. Throughout the                            procedure, the patient's blood pressure, pulse, and                            oxygen saturations were monitored continuously. The                            Colonoscope was introduced through the anus and                            advanced to the the terminal ileum, with                            identification of the appendiceal orifice and IC                            valve. A second forward view of the right colon was                            performed. The colonoscopy was performed without                            difficulty. The patient tolerated the procedure                            well. The quality of the bowel preparation was                            good. The terminal ileum, ileocecal valve,  appendiceal orifice, and rectum were photographed. Scope In: 8:16:32 AM Scope Out: 8:29:21 AM Scope Withdrawal Time: 0 hours 9 minutes 33 seconds  Total Procedure Duration: 0 hours 12 minutes 49 seconds  Findings:                 The perianal and digital rectal examinations were                            normal.                           A few small-mouthed diverticula were found in the                            sigmoid colon.                           A 3 mm polyp was found in the distal ascending                            colon. The polyp was sessile. The polyp was removed                            with a cold snare. Resection and retrieval were                            complete. Estimated blood loss was minimal.                           The exam was otherwise without abnormality on                            direct and retroflexion views. Complications:            No immediate complications. Estimated blood loss:                             Minimal. Estimated Blood Loss:     Estimated blood loss was minimal. Impression:               - No source of abdominal pain identified on this                            examination.                           - Diverticulosis in the sigmoid colon.                           - One 3 mm polyp in the distal ascending colon,                            removed with a cold snare. Resected and retrieved.                           - The examination was otherwise normal on  direct                            and retroflexion views. Recommendation:           - Patient has a contact number available for                            emergencies. The signs and symptoms of potential                            delayed complications were discussed with the                            patient. Return to normal activities tomorrow.                            Written discharge instructions were provided to the                            patient.                           - Resume previous diet today.                           - Continue present medications.                           - Await pathology results.                           - Repeat colonoscopy date to be determined after                            pending pathology results are reviewed for                            surveillance based on pathology results.                           - Follow-up encounter in the GI Office to review                            these results and make further recommendations. Thornton Park MD, MD 06/05/2019 8:39:19 AM This report has been signed electronically.

## 2019-06-07 ENCOUNTER — Encounter: Payer: Self-pay | Admitting: *Deleted

## 2019-06-07 ENCOUNTER — Telehealth: Payer: Self-pay

## 2019-06-07 NOTE — Telephone Encounter (Signed)
  Follow up Call-  Call back number 06/05/2019  Post procedure Call Back phone  # [phone]  Permission to leave phone message Yes  Some recent data might be hidden     Patient questions:  Do you have a fever, pain , or abdominal swelling? No. Pain Score  0 *  Have you tolerated food without any problems? Yes.    Have you been able to return to your normal activities? Yes.    Do you have any questions about your discharge instructions: Diet   No. Medications  No. Follow up visit  No.  Do you have questions or concerns about your Care? No.  Actions: * If pain score is 4 or above: No action needed, pain <4.  1. Have you developed a fever since your procedure? No  2.   Have you had an respiratory symptoms (SOB or cough) since your procedure? No  3.   Have you tested positive for COVID 19 since your procedure No  4.   Have you had any family members/close contacts diagnosed with the COVID 19 since your procedure?  No   If yes to any of these questions please route to Joylene John, RN and Alphonsa Gin, RN.

## 2019-06-14 ENCOUNTER — Encounter: Payer: Self-pay | Admitting: Gastroenterology

## 2019-07-03 ENCOUNTER — Ambulatory Visit: Payer: Managed Care, Other (non HMO) | Admitting: Gastroenterology

## 2019-07-31 ENCOUNTER — Ambulatory Visit: Payer: Managed Care, Other (non HMO) | Admitting: Gastroenterology

## 2019-07-31 ENCOUNTER — Encounter: Payer: Self-pay | Admitting: Gastroenterology

## 2019-07-31 ENCOUNTER — Telehealth: Payer: Self-pay | Admitting: Gastroenterology

## 2019-07-31 VITALS — BP 120/70 | HR 74 | Temp 98.4°F | Ht 61.0 in | Wt 156.0 lb

## 2019-07-31 DIAGNOSIS — K2990 Gastroduodenitis, unspecified, without bleeding: Secondary | ICD-10-CM

## 2019-07-31 DIAGNOSIS — K2970 Gastritis, unspecified, without bleeding: Secondary | ICD-10-CM | POA: Diagnosis not present

## 2019-07-31 DIAGNOSIS — K2100 Gastro-esophageal reflux disease with esophagitis, without bleeding: Secondary | ICD-10-CM

## 2019-07-31 MED ORDER — PANTOPRAZOLE SODIUM 40 MG PO TBEC: 40.0000 mg | DELAYED_RELEASE_TABLET | Freq: Every day | ORAL | 1 refills | Status: DC

## 2019-07-31 NOTE — Progress Notes (Signed)
Referring Provider: Laurey Morale, MD Primary Care Physician:  Laurey Morale, MD  Chief complaint:  Abdominal pain   IMPRESSION:  Gastritis on recent EGD Reflux esophagitis with intermittent solid food dysphasia Abdominal pain primarily in the left upper quadrant and flank, occasionally in the right lower and left lower quadrants    - CT abd/pelvis with contrast 02/16/19: sigmoid diverticulosis; cholelithiasis; no acute abnormality seen Gallstones without associated biliary colic Constipation with daily BM Diverticulosis Normal screening colonoscopy 2013 Sharlett Iles) History of colon polyp    -Tubular adenoma removed on colonoscopy 06/05/2019    -Surveillance colonoscopy recommended in 2027  Abdominal pain improved after 2 weeks of pantoprazole.  Symptoms may have been related to reflux esophagitis or H. pylori negative gastritis identified on her recent endoscopy.  I recommended that she complete 8 weeks of daily pantoprazole.  May taper her dose at that time.  She did not try Metamucil after our initial consultation.  I recommended that she start a trial of psyllium for stool bulking with any recurrent pain or constipation.  We reviewed the tubular adenoma removed on her recent colonoscopy.  Surveillance recommended in 7 years.   PLAN: Pantoprazole 40 mg QAM to complete 8 weeks Use Metamucil daily for stool bulking properties as needed Surveillance colonoscopy in 2027 She declined formal follow-up.  Return to this office as needed.   HPI: [name] [name] is a 63 y.o. female who returns in scheduled follow-up.  She was initially referred by Dr. Johney Maine for abdominal pain, bowel changes, and possible stricture.  The interval history is obtained to the patient and review of her electronic health record.  She has diabetes.  At the time of her consultation in August she noted left upper quadrant and left flank pain that developed in May. Occasionally has right lower quadrant and left  lower quadrant pain.  Previousy had one BM daily and even felt constipated.  At that time was having up to 5 BM daily. Post-prandial diarrhea relieved her symptoms. Worsened by greasy foods. Previousy with significant flatus but flatus has resolved with the onset of this pain. Feels like something is under her ribs. Great appetite. Initially lost some weight with changes in her diet - removed Coke, stopped eating at night, starting walking and bicyclying regularly.  She also reported a longstanding history of chronic heartburn with intermittent dysphagia to solids.  She was empirically treated with 10 days of Cipro for presumed diverticulitis.  Then on 02-16-19, after completing the antibiotics, she had an abdominal CT that showed cholelithiasis.  She was referred to surgery. Dr. Johney Maine felt that etiologies other than biliary colic should be considered and referred her to GI.   After my initial consultation in August she has endoscopic evaluation in September.  She was found to have H. pylori negative gastritis, reflux esophagitis, and a small tubular adenoma.  Duodenal biopsies were normal.  She returns in scheduled follow-up.  Her pain is largely resolved however she continues to have intermittent right lower quadrant and left lower quadrant pain.  She is relieved that her upper endoscopy did not show gastric cancer as the surgeon had mentioned this to her as a possibility earlier.  She discontinued pantoprazole after 2 weeks because she did not think it was necessary.  Denies any ongoing dysphagia, odynophagia, chest pain, nausea.  She denies the use of NSAIDs.  No new complaints or concerns at this time.  Labs 02/02/19: normal CBC, normal liver enzymes, normal lipase  CT ab/pelvis  with contrast 02/16/2019 1. No acute intra-abdominal abnormality detected. No findings to explain the patient's left upper quadrant abdominal pain. 2. Cholelithiasis without CT evidence of acute cholecystitis. 3. Sigmoid  diverticulosis without CT evidence of diverticulitis.  Endoscopic history: EGD 06/05/2019: H. pylori negative gastritis, reflux esophagitis.  Biopsies negative for eosinophilic esophagitis.  Duodenal biopsies were normal. Colonoscopy 06/05/2019: Sigmoid diverticulosis, a small tubular adenoma  Past Medical History:  Diagnosis Date  . Diabetes mellitus without complication ([place])   . Ruptured ovarian cyst     Past Surgical History:  Procedure Laterality Date  . CESAREAN SECTION    . COLONOSCOPY  11-25-11   per Dr. Sharlett Iles, diverticulosis only, repeat in 10 yrs   . lumpectomy,benign, right breast     x2    Current Outpatient Medications  Medication Sig Dispense Refill  . cyclobenzaprine (FLEXERIL) 10 MG tablet TAKE 1 TABLET BY MOUTH 3 TIMES DAILY AS NEEDED FOR MUSCLE SPASMS (Patient not taking: Reported on 06/05/2019) 90 tablet 2  . diphenoxylate-atropine (LOMOTIL) 2.5-0.025 MG tablet Take 2 tablets by mouth 4 (four) times daily as needed for diarrhea or loose stools. (Patient not taking: Reported on 06/05/2019) 60 tablet 0  . halobetasol (ULTRAVATE) 0.05 % cream apply topically 2 times a day 50 g 0  . Melatonin 3 MG TABS Taking 3 tablets by mouth at bedtime as needed.    . pantoprazole (PROTONIX) 40 MG tablet Take 1 tablet (40 mg total) by mouth daily. 90 tablet 0  . promethazine (PHENERGAN) 25 MG tablet Take 1 tablet (25 mg total) by mouth every 4 (four) hours as needed for nausea or vomiting. 30 tablet 0   No current facility-administered medications for this visit.     Allergies as of 07/31/2019 - Review Complete 06/05/2019  Allergen Reaction Noted  . Hydrocodone-homatropine Nausea And Vomiting 08/24/2014    Family History  Problem Relation Age of Onset  . Cancer Other        breast cancer  . Colon cancer Neg Hx   . Esophageal cancer Neg Hx   . Stomach cancer Neg Hx   . Rectal cancer Neg Hx     Social History   Socioeconomic History  . Marital status: Married     Spouse name: Not on file  . Number of children: Not on file  . Years of education: Not on file  . Highest education level: Not on file  Occupational History  . Not on file  Social Needs  . Financial resource strain: Not on file  . Food insecurity    Worry: Not on file    Inability: Not on file  . Transportation needs    Medical: Not on file    Non-medical: Not on file  Tobacco Use  . Smoking status: Never Smoker  . Smokeless tobacco: Never Used  Substance and Sexual Activity  . Alcohol use: Yes    Alcohol/week: 0.0 standard drinks    Comment: maybe twice a month  . Drug use: No  . Sexual activity: Not on file  Lifestyle  . Physical activity    Days per week: Not on file    Minutes per session: Not on file  . Stress: Not on file  Relationships  . Social Herbalist on phone: Not on file    Gets together: Not on file    Attends religious service: Not on file    Active member of club or organization: Not on file    Attends meetings  of clubs or organizations: Not on file    Relationship status: Not on file  . Intimate partner violence    Fear of current or ex partner: Not on file    Emotionally abused: Not on file    Physically abused: Not on file    Forced sexual activity: Not on file  Other Topics Concern  . Not on file  Social History Narrative   Married   Never Smoker   Alcohol use-yes   Drug use-no           Physical Exam: General:   Alert,  well-nourished, pleasant and cooperative in NAD Head:  Normocephalic and atraumatic. Eyes:  Sclera clear, no icterus.   Conjunctiva pink. Ears:  Normal auditory acuity. Nose:  No deformity, discharge,  or lesions. Mouth:  No deformity or lesions.   Neck:  Supple; no masses or thyromegaly. Lungs:  Clear throughout to auscultation.   No wheezes. Heart:  Regular rate and rhythm; no murmurs. Abdomen:  Soft,nontender, nondistended, normal bowel sounds, no rebound or guarding. No hepatosplenomegaly.   Rectal:   Deferred  Msk:  Symmetrical. No boney deformities LAD: No inguinal or umbilical LAD Extremities:  No clubbing or edema. Neurologic:  Alert and  oriented x4;  grossly nonfocal Skin:  Intact without significant lesions or rashes. Psych:  Alert and cooperative. Normal mood and affect.     Avelynn Sellin L. Tarri Glenn, MD, MPH 07/31/2019, 2:38 PM

## 2019-07-31 NOTE — Patient Instructions (Addendum)
Thank you for coming to see me today.  Your endoscopy showed inflammation of the stomach (gastritis) and inflammation of your esophagus (reflux).   Please take the pantoprazole every day for the next 8 weeks. It is best to take it 30 minutes prior to eating. We have sent a 30 day prescription of this to [facility] with 1 additional refill.  Please call me if your pain continues even after finishing 8 weeks of treatment.  If you are age 61 or older, your body mass index should be between 23-30. Your Body mass index is 29.48 kg/m. If this is out of the aforementioned range listed, please consider follow up with your Primary Care Provider.  If you are age 42 or younger, your body mass index should be between 19-25. Your Body mass index is 29.48 kg/m. If this is out of the aformentioned range listed, please consider follow up with your Primary Care Provider.

## 2019-08-01 NOTE — Telephone Encounter (Signed)
May substitute for any PPI that is cheaper in her formulary. Thanks.

## 2019-08-01 NOTE — Telephone Encounter (Signed)
Left message for patient to return my call.

## 2019-08-01 NOTE — Telephone Encounter (Signed)
Dr. Tarri Glenn, Patient states prescription for pantoprazole is too expensive. Would you like patient to try another PPI?

## 2019-08-02 MED ORDER — ESOMEPRAZOLE MAGNESIUM 40 MG PO CPDR: 40.0000 mg | DELAYED_RELEASE_CAPSULE | Freq: Every day | ORAL | 1 refills | Status: DC

## 2019-08-02 NOTE — Telephone Encounter (Signed)
New prescription sent for generic Nexium in the place of pantoprazole to [facility].

## 2019-09-04 ENCOUNTER — Telehealth (INDEPENDENT_AMBULATORY_CARE_PROVIDER_SITE_OTHER): Payer: Managed Care, Other (non HMO) | Admitting: Family Medicine

## 2019-09-04 ENCOUNTER — Other Ambulatory Visit: Payer: Self-pay

## 2019-09-04 ENCOUNTER — Telehealth: Payer: Self-pay | Admitting: Family Medicine

## 2019-09-04 ENCOUNTER — Encounter: Payer: Self-pay | Admitting: Family Medicine

## 2019-09-04 DIAGNOSIS — M545 Low back pain, unspecified: Secondary | ICD-10-CM

## 2019-09-04 DIAGNOSIS — E119 Type 2 diabetes mellitus without complications: Secondary | ICD-10-CM

## 2019-09-04 MED ORDER — TRAMADOL HCL 50 MG PO TABS: 100.0000 mg | ORAL_TABLET | Freq: Four times a day (QID) | ORAL | 1 refills | Status: DC | PRN

## 2019-09-04 NOTE — Progress Notes (Signed)
Virtual Visit via Video Note  I connected with the patient on 09/04/19 at  9:00 AM EST by a video enabled telemedicine application and verified that I am speaking with the correct person using two identifiers.  Location patient: home Location provider:work or home office Persons participating in the virtual visit: patient, provider  I discussed the limitations of evaluation and management by telemedicine and the availability of in person appointments. The patient expressed understanding and agreed to proceed.   HPI: Here to ask about low back pain. She has this frequently, but in the past few days it has gotten worse. She saw Dr. Nelva Bush for sacroiliac pain in September, but she says this feels different. It is in the right lower back. No recent trauma. Iburpofen helps, but she is being treated by Dr. Tarri Glenn in GI for gastritis and GERD, so we should avoid NSAIDs if possible. She is scheduled for surgery on the right shoulder in 2 weeks with Dr. Victorino December. She is concerned about her diabetes also, since this was last checked in May   ROS: See pertinent positives and negatives per HPI.  Past Medical History:  Diagnosis Date  . Diabetes mellitus without complication ([place])   . Ruptured ovarian cyst     Past Surgical History:  Procedure Laterality Date  . CESAREAN SECTION    . COLONOSCOPY  11-25-11   per Dr. Sharlett Iles, diverticulosis only, repeat in 10 yrs   . lumpectomy,benign, right breast     x2    Family History  Problem Relation Age of Onset  . Cancer Other        breast cancer  . Colon cancer Neg Hx   . Esophageal cancer Neg Hx   . Stomach cancer Neg Hx   . Rectal cancer Neg Hx      Current Outpatient Medications:  .  esomeprazole (NEXIUM) 40 MG capsule, Take 1 capsule (40 mg total) by mouth daily at 12 noon., Disp: 30 capsule, Rfl: 1  EXAM:  VITALS per patient if applicable:  GENERAL: alert, oriented, appears well and in no acute distress  HEENT: atraumatic,  conjunttiva clear, no obvious abnormalities on inspection of external nose and ears  NECK: normal movements of the head and neck  LUNGS: on inspection no signs of respiratory distress, breathing rate appears normal, no obvious gross SOB, gasping or wheezing  CV: no obvious cyanosis  MS: moves all visible extremities without noticeable abnormality  PSYCH/NEURO: pleasant and cooperative, no obvious depression or anxiety, speech and thought processing grossly intact  ASSESSMENT AND PLAN: For the low back pain, she will try Tramadol as needed. She will come by the lab later today for an A1c.  Alysia Penna, MD  Discussed the following assessment and plan:  Diabetes mellitus without complication [facility])     I discussed the assessment and treatment plan with the patient. The patient was provided an opportunity to ask questions and all were answered. The patient agreed with the plan and demonstrated an understanding of the instructions.   The patient was advised to call back or seek an in-person evaluation if the symptoms worsen or if the condition fails to improve as anticipated.

## 2019-09-04 NOTE — Telephone Encounter (Signed)
Patient is calling to schedule labs for A1C. Orders have not be placed. Please advise CB- [phone]

## 2019-09-11 ENCOUNTER — Other Ambulatory Visit (INDEPENDENT_AMBULATORY_CARE_PROVIDER_SITE_OTHER): Payer: Managed Care, Other (non HMO)

## 2019-09-11 ENCOUNTER — Other Ambulatory Visit: Payer: Self-pay

## 2019-09-11 DIAGNOSIS — E119 Type 2 diabetes mellitus without complications: Secondary | ICD-10-CM

## 2019-09-11 LAB — HEMOGLOBIN A1C: Hgb A1c MFr Bld: 7.4 % — ABNORMAL HIGH (ref 4.6–6.5)

## 2019-09-11 NOTE — Telephone Encounter (Signed)
order has already been placed and labs have been drawn. nothing further needed.

## 2019-09-21 ENCOUNTER — Encounter: Payer: Managed Care, Other (non HMO) | Admitting: Family Medicine

## 2019-11-11 ENCOUNTER — Ambulatory Visit: Payer: 59 | Attending: Internal Medicine

## 2019-11-11 DIAGNOSIS — Z23 Encounter for immunization: Secondary | ICD-10-CM | POA: Insufficient documentation

## 2019-11-11 NOTE — Progress Notes (Signed)
   Covid-19 Vaccination Clinic  Name:  [name] [name]    MRN: [id number] DOB: [birth year]  11/11/2019  [name] was observed post Covid-19 immunization for 15 minutes without incident. She was provided with Vaccine Information Sheet and instruction to access the V-Safe system.   [name] was instructed to call 911 with any severe reactions post vaccine: Marland Kitchen Difficulty breathing  . Swelling of face and throat  . A fast heartbeat  . A bad rash all over body  . Dizziness and weakness   Immunizations Administered    Name Date Dose VIS Date Route   Pfizer COVID-19 Vaccine 11/11/2019  2:36 PM 0.3 mL 08/18/2019 Intramuscular   Manufacturer: [place]   Lot: [id number]   [place]: [id number]

## 2019-12-04 ENCOUNTER — Ambulatory Visit: Payer: 59 | Attending: Internal Medicine

## 2019-12-04 DIAGNOSIS — Z23 Encounter for immunization: Secondary | ICD-10-CM

## 2019-12-04 NOTE — Progress Notes (Signed)
   Covid-19 Vaccination Clinic  Name:  [name] [name]    MRN: [id number] DOB: 1955/09/30  12/04/2019  [name] was observed post Covid-19 immunization for 15 minutes without incident. She was provided with Vaccine Information Sheet and instruction to access the V-Safe system.   [name] was instructed to call 911 with any severe reactions post vaccine: Marland Kitchen Difficulty breathing  . Swelling of face and throat  . A fast heartbeat  . A bad rash all over body  . Dizziness and weakness   Immunizations Administered    Name Date Dose VIS Date Route   Pfizer COVID-19 Vaccine 12/04/2019  8:10 AM 0.3 mL 08/18/2019 Intramuscular   Manufacturer: [place]   Lot: [id number]   [place]: [id number]

## 2019-12-12 ENCOUNTER — Ambulatory Visit: Payer: 59

## 2020-01-03 ENCOUNTER — Ambulatory Visit (INDEPENDENT_AMBULATORY_CARE_PROVIDER_SITE_OTHER): Payer: 59 | Admitting: Family Medicine

## 2020-01-03 ENCOUNTER — Encounter: Payer: Self-pay | Admitting: Family Medicine

## 2020-01-03 VITALS — BP 150/82 | HR 80 | Temp 97.6°F | Ht 61.0 in | Wt 165.0 lb

## 2020-01-03 DIAGNOSIS — E119 Type 2 diabetes mellitus without complications: Secondary | ICD-10-CM

## 2020-01-03 DIAGNOSIS — Z0000 Encounter for general adult medical examination without abnormal findings: Secondary | ICD-10-CM | POA: Diagnosis not present

## 2020-01-03 LAB — CBC WITH DIFFERENTIAL/PLATELET
Basophils Absolute: 0.1 10*3/uL (ref 0.0–0.1)
Basophils Relative: 0.8 % (ref 0.0–3.0)
Eosinophils Absolute: 0.2 10*3/uL (ref 0.0–0.7)
Eosinophils Relative: 2.4 % (ref 0.0–5.0)
HCT: 38.8 % (ref 36.0–46.0)
Hemoglobin: 12.8 g/dL (ref 12.0–15.0)
Lymphocytes Relative: 27.9 % (ref 12.0–46.0)
Lymphs Abs: 2.1 10*3/uL (ref 0.7–4.0)
MCHC: 32.9 g/dL (ref 30.0–36.0)
MCV: 84.2 fl (ref 78.0–100.0)
Monocytes Absolute: 0.5 10*3/uL (ref 0.1–1.0)
Monocytes Relative: 6.1 % (ref 3.0–12.0)
Neutro Abs: 4.7 10*3/uL (ref 1.4–7.7)
Neutrophils Relative %: 62.8 % (ref 43.0–77.0)
Platelets: 270 10*3/uL (ref 150.0–400.0)
RBC: 4.61 Mil/uL (ref 3.87–5.11)
RDW: 15.7 % — ABNORMAL HIGH (ref 11.5–15.5)
WBC: 7.5 10*3/uL (ref 4.0–10.5)

## 2020-01-03 LAB — LIPID PANEL
Cholesterol: 232 mg/dL — ABNORMAL HIGH (ref 0–200)
HDL: 54.8 mg/dL (ref >39.00)
LDL Cholesterol: 152 mg/dL — ABNORMAL HIGH (ref 0–99)
NonHDL: 176.93
Total CHOL/HDL Ratio: 4
Triglycerides: 127 mg/dL (ref 0.0–149.0)
VLDL: 25.4 mg/dL (ref 0.0–40.0)

## 2020-01-03 LAB — HEPATIC FUNCTION PANEL
ALT: 9 U/L (ref 0–35)
AST: 14 U/L (ref 0–37)
Albumin: 4 g/dL (ref 3.5–5.2)
Alkaline Phosphatase: 128 U/L — ABNORMAL HIGH (ref 39–117)
Bilirubin, Direct: 0.1 mg/dL (ref 0.0–0.3)
Total Bilirubin: 0.6 mg/dL (ref 0.2–1.2)
Total Protein: 7.2 g/dL (ref 6.0–8.3)

## 2020-01-03 LAB — HEMOGLOBIN A1C: Hgb A1c MFr Bld: 9 % — ABNORMAL HIGH (ref 4.6–6.5)

## 2020-01-03 LAB — BASIC METABOLIC PANEL
BUN: 14 mg/dL (ref 6–23)
CO2: 29 mEq/L (ref 19–32)
Calcium: 8.9 mg/dL (ref 8.4–10.5)
Chloride: 101 mEq/L (ref 96–112)
Creatinine, Ser: 0.6 mg/dL (ref 0.40–1.20)
GFR: 101.23 mL/min (ref >60.00)
Glucose, Bld: 139 mg/dL — ABNORMAL HIGH (ref 70–99)
Potassium: 4.1 mEq/L (ref 3.5–5.1)
Sodium: 138 mEq/L (ref 135–145)

## 2020-01-03 LAB — TSH: TSH: 2.16 u[IU]/mL (ref 0.35–4.50)

## 2020-01-03 NOTE — Progress Notes (Signed)
   Subjective:    Patient ID: [name] [name], female    DOB: 08/03/1956, 64 y.o.   MRN: [id number]  HPI Here for a well exam. She is doing well in general, although she has been seeing Dr. Victorino December for right shoulder pain due to impingement. She just completed a long period of PT, and this has improved. She hopes to return to work soon.    Review of Systems  Constitutional: Negative.   HENT: Negative.   Eyes: Negative.   Respiratory: Negative.   Cardiovascular: Negative.   Gastrointestinal: Negative.   Genitourinary: Negative for decreased urine volume, difficulty urinating, dyspareunia, dysuria, enuresis, flank pain, frequency, hematuria, pelvic pain and urgency.  Musculoskeletal: Negative.   Skin: Negative.   Neurological: Negative.   Psychiatric/Behavioral: Negative.        Objective:   Physical Exam Constitutional:      General: She is not in acute distress.    Appearance: She is well-developed.  HENT:     Head: Normocephalic and atraumatic.     Right Ear: External ear normal.     Left Ear: External ear normal.     Nose: Nose normal.     Mouth/Throat:     Pharynx: No oropharyngeal exudate.  Eyes:     General: No scleral icterus.    Conjunctiva/sclera: Conjunctivae normal.     Pupils: Pupils are equal, round, and reactive to light.  Neck:     Thyroid: No thyromegaly.     Vascular: No JVD.  Cardiovascular:     Rate and Rhythm: Normal rate and regular rhythm.     Heart sounds: Normal heart sounds. No murmur. No friction rub. No gallop.   Pulmonary:     Effort: Pulmonary effort is normal. No respiratory distress.     Breath sounds: Normal breath sounds. No wheezing or rales.  Chest:     Chest wall: No tenderness.  Abdominal:     General: Bowel sounds are normal. There is no distension.     Palpations: Abdomen is soft. There is no mass.     Tenderness: There is no abdominal tenderness. There is no guarding or rebound.  Musculoskeletal:        General: No  tenderness. Normal range of motion.     Cervical back: Normal range of motion and neck supple.  Lymphadenopathy:     Cervical: No cervical adenopathy.  Skin:    General: Skin is warm and dry.     Findings: No erythema or rash.  Neurological:     Mental Status: She is alert and oriented to person, place, and time.     Cranial Nerves: No cranial nerve deficit.     Motor: No abnormal muscle tone.     Coordination: Coordination normal.     Deep Tendon Reflexes: Reflexes are normal and symmetric. Reflexes normal.  Psychiatric:        Behavior: Behavior normal.        Thought Content: Thought content normal.        Judgment: Judgment normal.           Assessment & Plan:  Well exam. We discussed diet and exercise. Get fasting labs. I reminded her to set up another mammogram.  Alysia Penna, MD

## 2020-01-08 ENCOUNTER — Telehealth: Payer: Self-pay | Admitting: Family Medicine

## 2020-01-08 NOTE — Telephone Encounter (Signed)
Pt returned call about results, would like a call back.

## 2020-01-09 NOTE — Telephone Encounter (Signed)
See result notes. 

## 2020-01-10 ENCOUNTER — Other Ambulatory Visit: Payer: Self-pay

## 2020-01-10 MED ORDER — GLIPIZIDE 5 MG PO TABS
5.0000 mg | ORAL_TABLET | Freq: Two times a day (BID) | ORAL | 5 refills | Status: DC
Start: 2020-01-10 — End: 2021-03-04

## 2020-01-10 MED ORDER — METFORMIN HCL 500 MG PO TABS
500.0000 mg | ORAL_TABLET | Freq: Two times a day (BID) | ORAL | 5 refills | Status: DC
Start: 2020-01-10 — End: 2020-08-12

## 2020-06-28 ENCOUNTER — Telehealth (INDEPENDENT_AMBULATORY_CARE_PROVIDER_SITE_OTHER): Payer: 59 | Admitting: Family Medicine

## 2020-06-28 ENCOUNTER — Other Ambulatory Visit: Payer: Self-pay

## 2020-06-28 ENCOUNTER — Encounter: Payer: Self-pay | Admitting: Family Medicine

## 2020-06-28 VITALS — BP 136/82 | HR 68 | Temp 98.0°F | Wt 163.0 lb

## 2020-06-28 DIAGNOSIS — R112 Nausea with vomiting, unspecified: Secondary | ICD-10-CM

## 2020-06-28 DIAGNOSIS — R1011 Right upper quadrant pain: Secondary | ICD-10-CM | POA: Diagnosis not present

## 2020-06-28 DIAGNOSIS — R42 Dizziness and giddiness: Secondary | ICD-10-CM | POA: Diagnosis not present

## 2020-06-28 DIAGNOSIS — E1169 Type 2 diabetes mellitus with other specified complication: Secondary | ICD-10-CM | POA: Diagnosis not present

## 2020-06-28 LAB — POCT URINALYSIS DIPSTICK
Bilirubin, UA: NEGATIVE
Blood, UA: NEGATIVE
Glucose, UA: NEGATIVE
Ketones, UA: NEGATIVE
Leukocytes, UA: NEGATIVE
Nitrite, UA: NEGATIVE
Protein, UA: NEGATIVE
Spec Grav, UA: 1.015 (ref 1.010–1.025)
Urobilinogen, UA: 0.2 E.U./dL
pH, UA: 7.5 (ref 5.0–8.0)

## 2020-06-28 MED ORDER — MECLIZINE HCL 25 MG PO TABS: 25.0000 mg | ORAL_TABLET | Freq: Three times a day (TID) | ORAL | 0 refills | Status: DC | PRN

## 2020-06-28 NOTE — Progress Notes (Signed)
Subjective:    Patient ID: [name] [name], female    DOB: 1955-09-22, 64 y.o.   MRN: [id number]  No chief complaint on file.   HPI  Patient is a 64 year old female with past medical history significant for diabetes, diverticulosis who was seen by Dr. Sarajane Jews.  Patient was seen for acute concern.  Visit initially started via phone however patient advised to come into clinic given symptoms as she was sitting in her car.  Pt started feeling dizzy last night.  Sensation continued when she woke up causing her to vomit.  Pt endorses having normal BMs x 4 this am.  Pt endorses emesis 4-5 x this am.  Pt endorses chills, HAs, RUQ pain.  Pt denies cough, rhinorrhea, facial pain or pressure. Vertigo several years ago but states this feels different.  Pt with a history of DM 2 on glipizide 5 mg twice daily and Metformin 500 mg twice daily. Pt not checking FSBS at home as she does not have a glucometer. Hemoglobin A1c 9% on 01/03/2020.  Past Medical History:  Diagnosis Date  . Diabetes mellitus without complication ([place])   . Ruptured ovarian cyst     Allergies  Allergen Reactions  . Hydrocodone-Homatropine Nausea And Vomiting    ROS General: Denies fever, chills, night sweats, changes in weight, changes in appetite  + dizziness HEENT: Denies headaches, ear pain, changes in vision, rhinorrhea, sore throat CV: Denies CP, palpitations, SOB, orthopnea Pulm: Denies SOB, cough, wheezing GI: Denies diarrhea, constipation   + abdominal pain, nausea, vomiting GU: Denies dysuria, hematuria, frequency, vaginal discharge Msk: Denies muscle cramps, joint pains Neuro: Denies weakness, numbness, tingling Skin: Denies rashes, bruising Psych: Denies depression, anxiety, hallucinations     Objective:    Blood pressure 136/82, pulse 68, temperature 98 F (36.7 C), temperature source Oral, weight 163 lb (73.9 kg), SpO2 98 %.   Gen. Pleasant, well-nourished, in no distress, normal affect   HEENT: [place]/AT, face  symmetric, conjunctiva clear, no scleral icterus, PERRLA, EOMI, no nystagmus. Nares patent without drainage, pharynx without erythema or exudate. Neck: No JVD, no thyromegaly Lungs: no accessory muscle use, CTAB, no wheezes or rales Cardiovascular: RRR, no m/r/g, no peripheral edema Abdomen: BS present, soft, TTP in RUQ, ND, no hepatosplenomegaly. Musculoskeletal: No deformities, no cyanosis or clubbing, normal tone Neuro:  A&Ox3, CN II-XII intact, normal gait Skin:  Warm, no lesions/ rash   Wt Readings from Last 3 Encounters:  01/03/20 165 lb (74.8 kg)  07/31/19 156 lb (70.8 kg)  06/05/19 155 lb (70.3 kg)    Lab Results  Component Value Date   WBC 7.5 01/03/2020   HGB 12.8 01/03/2020   HCT 38.8 01/03/2020   PLT 270.0 01/03/2020   GLUCOSE 139 (H) 01/03/2020   CHOL 232 (H) 01/03/2020   TRIG 127.0 01/03/2020   HDL 54.80 01/03/2020   LDLCALC 152 (H) 01/03/2020   ALT 9 01/03/2020   AST 14 01/03/2020   NA 138 01/03/2020   K 4.1 01/03/2020   CL 101 01/03/2020   CREATININE 0.60 01/03/2020   BUN 14 01/03/2020   CO2 29 01/03/2020   TSH 2.16 01/03/2020   HGBA1C 9.0 (H) 01/03/2020   MICROALBUR <0.7 06/12/2016    Assessment/Plan:  Non-intractable vomiting with nausea, unspecified vomiting type -discussed possible causes including viral gastritis, vertigo, hyperglycemia, gallbladder etigology  -Antivert sent to pharmacy -Taking small sips of fluids and bland diet encouraged -given precautions for worsened symptoms -Given handout - Plan: CBC with Differential/Platelet, COMPLETE METABOLIC PANEL WITH  GFR, Gamma GT, Hemoglobin A1c, Lipase  Dizziness  -Discussed possible causes including dehydration, vertigo, hypo/hyperglycemia, UTI -UA unremarkable. SG 1.015, pH 7.5. No leuks, nitrite, glucose, blood, ketones. -rx for Antivert sent to pharmacy -Patient given precautions for worsening or continued symptoms - Plan: Hemoglobin A1c  RUQ pain  -Concern for cholecystitis.  Consider  pancreatitis given history of diabetes -Alk phos elevated at 128 on 01/03/2020 -UA unremarkable -Order placed for RUQ ultrasound -Patient given precautions -Given handout - Plan: POCT urinalysis dipstick, CBC with Differential/Platelet, COMPLETE METABOLIC PANEL WITH GFR, Gamma GT, Lipase, RUQ u/s  Type 2 diabetes mellitus with other specified complication, without long-term current use of insulin (HCC) -Hemoglobin A1c 9.0% on 01/03/2020 -Continue lifestyle modifications -Continue current medications including Metformin 500 mg twice daily and glipizide 5 mg twice daily -We will have patient follow-up with PCP in the next few weeks for further management -We will need foot and eye exam - Plan: Microalbumin/Creatinine Ratio, Urine, Hemoglobin A1c  F/u prn  Grier Mitts, MD

## 2020-06-28 NOTE — Patient Instructions (Signed)
Nausea and Vomiting, Adult Nausea is the feeling that you have an upset stomach or that you are about to vomit. Vomiting is when stomach contents are thrown up and out of the mouth as a result of nausea. Vomiting can make you feel weak and cause you to become dehydrated. Dehydration can make you feel tired and thirsty, cause you to have a dry mouth, and decrease how often you urinate. Older adults and people with other diseases or a weak disease-fighting system (immune system) are at higher risk for dehydration. It is important to treat your nausea and vomiting as told by your health care provider. Follow these instructions at home: Watch your symptoms for any changes. Tell your health care provider about them. Follow these instructions to care for yourself at home. Eating and drinking      Take an oral rehydration solution (ORS). This is a drink that is sold at pharmacies and retail stores.  Drink clear fluids slowly and in small amounts as you are able. Clear fluids include water, ice chips, low-calorie sports drinks, and fruit juice that has water added (diluted fruit juice).  Eat bland, easy-to-digest foods in small amounts as you are able. These foods include bananas, applesauce, rice, lean meats, toast, and crackers.  Avoid fluids that contain a lot of sugar or caffeine, such as energy drinks, sports drinks, and soda.  Avoid alcohol.  Avoid spicy or fatty foods. General instructions  Take over-the-counter and prescription medicines only as told by your health care provider.  Drink enough fluid to keep your urine pale yellow.  Wash your hands often using soap and water. If soap and water are not available, use hand sanitizer.  Make sure that all people in your household wash their hands well and often.  Rest at home while you recover.  Watch your condition for any changes.  Breathe slowly and deeply when you feel nauseated.  Keep all follow-up visits as told by your health  care provider. This is important. Contact a health care provider if:  Your symptoms get worse.  You have new symptoms.  You have a fever.  You cannot drink fluids without vomiting.  Your nausea does not go away after 2 days.  You feel light-headed or dizzy.  You have a headache.  You have muscle cramps.  You have a rash.  You have pain while urinating. Get help right away if:  You have pain in your chest, neck, arm, or jaw.  You feel extremely weak or you faint.  You have persistent vomiting.  You have vomit that is bright red or looks like black coffee grounds.  You have bloody or black stools or stools that look like tar.  You have a severe headache, a stiff neck, or both.  You have severe pain, cramping, or bloating in your abdomen.  You have difficulty breathing, or you are breathing very quickly.  Your heart is beating very quickly.  Your skin feels cold and clammy.  You feel confused.  You have signs of dehydration, such as: ? Dark urine, very little urine, or no urine. ? Cracked lips. ? Dry mouth. ? Sunken eyes. ? Sleepiness. ? Weakness. These symptoms may represent a serious problem that is an emergency. Do not wait to see if the symptoms will go away. Get medical help right away. Call your local emergency services (911 in the [place] Do not drive yourself to the hospital. Summary  Nausea is the feeling that you have an upset stomach  or that you are about to vomit. As nausea gets worse, it can lead to vomiting. Vomiting can make you feel weak and cause you to become dehydrated.  Follow instructions from your health care provider about eating and drinking to prevent dehydration.  Take over-the-counter and prescription medicines only as told by your health care provider.  Contact your health care provider if your symptoms get worse, or you have new symptoms.  Keep all follow-up visits as told by your health care provider. This is important. This  information is not intended to replace advice given to you by your health care provider. Make sure you discuss any questions you have with your health care provider. Document Revised: 12/16/2018 Document Reviewed: 02/01/2018 Elsevier Patient Education  [place].  Dizziness Dizziness is a common problem. It makes you feel unsteady or light-headed. You may feel like you are about to pass out (faint). Dizziness can lead to getting hurt if you stumble or fall. Dizziness can be caused by many things, including:  Medicines.  Not having enough water in your body (dehydration).  Illness. Follow these instructions at home: Eating and drinking   Drink enough fluid to keep your pee (urine) clear or pale yellow. This helps to keep you from getting dehydrated. Try to drink more clear fluids, such as water.  Do not drink alcohol.  Limit how much caffeine you drink or eat, if your doctor tells you to do that.  Limit how much salt (sodium) you drink or eat, if your doctor tells you to do that. Activity   Avoid making quick movements. ? When you stand up from sitting in a chair, steady yourself until you feel okay. ? In the morning, first sit up on the side of the bed. When you feel okay, stand slowly while you hold onto something. Do this until you know that your balance is fine.  If you need to stand in one place for a long time, move your legs often. Tighten and relax the muscles in your legs while you are standing.  Do not drive or use heavy machinery if you feel dizzy.  Avoid bending down if you feel dizzy. Place items in your home so you can reach them easily without leaning over. Lifestyle  Do not use any products that contain nicotine or tobacco, such as cigarettes and e-cigarettes. If you need help quitting, ask your doctor.  Try to lower your stress level. You can do this by using methods such as yoga or meditation. Talk with your doctor if you need help. General  instructions  Watch your dizziness for any changes.  Take over-the-counter and prescription medicines only as told by your doctor. Talk with your doctor if you think that you are dizzy because of a medicine that you are taking.  Tell a friend or a family member that you are feeling dizzy. If he or she notices any changes in your behavior, have this person call your doctor.  Keep all follow-up visits as told by your doctor. This is important. Contact a doctor if:  Your dizziness does not go away.  Your dizziness or light-headedness gets worse.  You feel sick to your stomach (nauseous).  You have trouble hearing.  You have new symptoms.  You are unsteady on your feet.  You feel like the room is spinning. Get help right away if:  You throw up (vomit) or have watery poop (diarrhea), and you cannot eat or drink anything.  You have trouble: ?  Talking. ? Walking. ? Swallowing. ? Using your arms, hands, or legs.  You feel generally weak.  You are not thinking clearly, or you have trouble forming sentences. A friend or family member may notice this.  You have: ? Chest pain. ? Pain in your belly (abdomen). ? Shortness of breath. ? Sweating.  Your vision changes.  You are bleeding.  You have a very bad headache.  You have neck pain or a stiff neck.  You have a fever. These symptoms may be an emergency. Do not wait to see if the symptoms will go away. Get medical help right away. Call your local emergency services (911 in the [place] Do not drive yourself to the hospital. Summary  Dizziness makes you feel unsteady or light-headed. You may feel like you are about to pass out (faint).  Drink enough fluid to keep your pee (urine) clear or pale yellow. Do not drink alcohol.  Avoid making quick movements if you feel dizzy.  Watch your dizziness for any changes. This information is not intended to replace advice given to you by your health care provider. Make sure you  discuss any questions you have with your health care provider. Document Revised: 08/27/2017 Document Reviewed: 09/10/2016 Elsevier Patient Education  [place].

## 2020-06-29 LAB — COMPLETE METABOLIC PANEL WITH GFR
AG Ratio: 1.2 (calc) (ref 1.0–2.5)
ALT: 8 U/L (ref 6–29)
AST: 18 U/L (ref 10–35)
Albumin: 4.2 g/dL (ref 3.6–5.1)
Alkaline phosphatase (APISO): 105 U/L (ref 37–153)
BUN: 15 mg/dL (ref 7–25)
CO2: 29 mmol/L (ref 20–32)
Calcium: 9.5 mg/dL (ref 8.6–10.4)
Chloride: 102 mmol/L (ref 98–110)
Creat: 0.57 mg/dL (ref 0.50–0.99)
GFR, Est African American: 115 mL/min/{1.73_m2} (ref >=60)
GFR, Est Non African American: 99 mL/min/{1.73_m2} (ref >=60)
Globulin: 3.5 g/dL (calc) (ref 1.9–3.7)
Glucose, Bld: 119 mg/dL — ABNORMAL HIGH (ref 65–99)
Potassium: 4 mmol/L (ref 3.5–5.3)
Sodium: 139 mmol/L (ref 135–146)
Total Bilirubin: 0.6 mg/dL (ref 0.2–1.2)
Total Protein: 7.7 g/dL (ref 6.1–8.1)

## 2020-06-29 LAB — CBC WITH DIFFERENTIAL/PLATELET
Absolute Monocytes: 470 cells/uL (ref 200–950)
Basophils Absolute: 58 cells/uL (ref 0–200)
Basophils Relative: 0.6 %
Eosinophils Absolute: 125 cells/uL (ref 15–500)
Eosinophils Relative: 1.3 %
HCT: 41.3 % (ref 35.0–45.0)
Hemoglobin: 13.3 g/dL (ref 11.7–15.5)
Lymphs Abs: 2419 cells/uL (ref 850–3900)
MCH: 27.2 pg (ref 27.0–33.0)
MCHC: 32.2 g/dL (ref 32.0–36.0)
MCV: 84.5 fL (ref 80.0–100.0)
MPV: 11.3 fL (ref 7.5–12.5)
Monocytes Relative: 4.9 %
Neutro Abs: 6528 cells/uL (ref 1500–7800)
Neutrophils Relative %: 68 %
Platelets: 358 10*3/uL (ref 140–400)
RBC: 4.89 10*6/uL (ref 3.80–5.10)
RDW: 14 % (ref 11.0–15.0)
Total Lymphocyte: 25.2 %
WBC: 9.6 10*3/uL (ref 3.8–10.8)

## 2020-06-29 LAB — MICROALBUMIN / CREATININE URINE RATIO
Creatinine, Urine: 50 mg/dL (ref 20–275)
Microalb Creat Ratio: 10 mcg/mg creat (ref <30)
Microalb, Ur: 0.5 mg/dL

## 2020-06-29 LAB — GAMMA GT: GGT: 24 U/L (ref 3–65)

## 2020-06-29 LAB — HEMOGLOBIN A1C
Hgb A1c MFr Bld: 6.6 % of total Hgb — ABNORMAL HIGH (ref <5.7)
Mean Plasma Glucose: 143 (calc)
eAG (mmol/L): 7.9 (calc)

## 2020-06-29 LAB — LIPASE: Lipase: 13 U/L (ref 7–60)

## 2020-07-01 ENCOUNTER — Telehealth: Payer: Self-pay | Admitting: *Deleted

## 2020-07-01 NOTE — Telephone Encounter (Signed)
Patient called requesting her lab results. Patient requested a call back today since she has to work tomorrow.

## 2020-07-02 NOTE — Telephone Encounter (Signed)
Pt has already viewed her lab results on MyChart portal, advised pt that the office will call her when Dr Volanda Napoleon views results

## 2020-07-03 ENCOUNTER — Other Ambulatory Visit: Payer: 59

## 2020-07-03 ENCOUNTER — Telehealth: Payer: Self-pay

## 2020-07-03 NOTE — Telephone Encounter (Signed)
Patient has received lab report earlier today. She is needing something other than Meclizine prescribed for dizziness. Virtual visit with Dr. Volanda Napoleon on 06/28/20.

## 2020-07-03 NOTE — Telephone Encounter (Signed)
Patient says Dr provided her with medication for dizziness and its not helping wanting to know what else she be prescribed to help her or if she needs to be referred to another Dr. Patient also wants to know if Dr has seen lab results she would like to hear from the nurse or Dr because she doesn't understand what she is reading in mychart    Please call and advise

## 2020-07-05 MED ORDER — DIAZEPAM 5 MG PO TABS: ORAL_TABLET | ORAL | 1 refills | Status: DC

## 2020-07-05 NOTE — Telephone Encounter (Signed)
The labs showed that her diabetes is under good control now. Otherwise labs are normal (she is not anemic, liver is okay, pancreas is okay, etc). I have sent her in some Valium to use as needed for the dizziness.

## 2020-07-05 NOTE — Telephone Encounter (Signed)
Spoke with pt verbalized understanding 

## 2020-07-08 ENCOUNTER — Ambulatory Visit
Admission: RE | Admit: 2020-07-08 | Discharge: 2020-07-08 | Disposition: A | Discharge status: Home / Self Care | Payer: 59 | Source: Ambulatory Visit | Attending: Family Medicine | Admitting: Family Medicine

## 2020-07-08 DIAGNOSIS — R1011 Right upper quadrant pain: Secondary | ICD-10-CM

## 2020-07-08 NOTE — Telephone Encounter (Signed)
Attempted to contact patient. No answer. LVM for patient to return call to office

## 2020-07-08 NOTE — Telephone Encounter (Signed)
Patient returned call. Informed patient per Dr.Fry The labs showed that her diabetes is under good control now. Otherwise labs are normal (she is not anemic, liver is okay, pancreas is okay, etc). He has sent her in some Valium to use as needed for the dizziness. Patient verbalized understanding. Patient wanted to know if we had Ultrasound results. Informed patient we do but has not been reviewed by a physician and once reviewed we will call her back with results.

## 2020-08-12 ENCOUNTER — Ambulatory Visit: Payer: 59 | Admitting: Family Medicine

## 2020-08-12 ENCOUNTER — Other Ambulatory Visit: Payer: Self-pay

## 2020-08-12 ENCOUNTER — Encounter: Payer: Self-pay | Admitting: Family Medicine

## 2020-08-12 VITALS — BP 148/86 | HR 74 | Temp 98.6°F | Ht 61.0 in | Wt 161.0 lb

## 2020-08-12 DIAGNOSIS — S76211A Strain of adductor muscle, fascia and tendon of right thigh, initial encounter: Secondary | ICD-10-CM

## 2020-08-12 MED ORDER — DICLOFENAC SODIUM 75 MG PO TBEC: 75.0000 mg | DELAYED_RELEASE_TABLET | Freq: Two times a day (BID) | ORAL | 2 refills | Status: DC

## 2020-08-12 MED ORDER — METFORMIN HCL 500 MG PO TABS: 500.0000 mg | ORAL_TABLET | Freq: Every day | ORAL | 3 refills | Status: DC

## 2020-08-12 NOTE — Progress Notes (Signed)
   Subjective:    Patient ID: [name] [name], female    DOB: 03-Nov-1955, 64 y.o.   MRN: [id number]  HPI Here for 3 days of a sharp pain in the right groin. No recent trauma. It hurts to flex her right hip, but standing or walking causes no pain. Tylenol has not helped.    Review of Systems  Constitutional: Negative.   Respiratory: Negative.   Cardiovascular: Negative.   Gastrointestinal: Negative.   Musculoskeletal: Positive for arthralgias.       Objective:   Physical Exam Constitutional:      General: She is not in acute distress.    Appearance: Normal appearance. She is well-developed.  Cardiovascular:     Rate and Rhythm: Normal rate and regular rhythm.     Pulses: Normal pulses.     Heart sounds: Normal heart sounds.  Pulmonary:     Effort: Pulmonary effort is normal.     Breath sounds: Normal breath sounds.  Musculoskeletal:     Comments: She is tender over the right hip flexor tendon, full ROM   Neurological:     Mental Status: She is alert.           Assessment & Plan:  Groin strain, rest, apply ice packs, and use Diclofenac as needed.  [name] Penna, MD

## 2020-09-09 ENCOUNTER — Other Ambulatory Visit: Payer: Self-pay | Admitting: Family Medicine

## 2020-09-11 NOTE — Telephone Encounter (Signed)
Last office visit- 08/12/2020 Last refill- this med had been d/c.  Unsure if wants to restart med

## 2020-09-16 ENCOUNTER — Ambulatory Visit: Payer: 59 | Admitting: Family Medicine

## 2020-10-21 ENCOUNTER — Telehealth: Payer: Self-pay | Admitting: Family Medicine

## 2020-10-21 ENCOUNTER — Telehealth (INDEPENDENT_AMBULATORY_CARE_PROVIDER_SITE_OTHER): Payer: 59 | Admitting: Family Medicine

## 2020-10-21 ENCOUNTER — Encounter: Payer: Self-pay | Admitting: Family Medicine

## 2020-10-21 VITALS — Ht 61.0 in | Wt 160.0 lb

## 2020-10-21 DIAGNOSIS — J40 Bronchitis, not specified as acute or chronic: Secondary | ICD-10-CM

## 2020-10-21 MED ORDER — BENZONATATE 200 MG PO CAPS: 200.0000 mg | ORAL_CAPSULE | Freq: Four times a day (QID) | ORAL | 1 refills | Status: DC | PRN

## 2020-10-21 MED ORDER — AZITHROMYCIN 250 MG PO TABS: ORAL_TABLET | ORAL | 0 refills | Status: DC

## 2020-10-21 NOTE — Progress Notes (Signed)
Subjective:    Patient ID: [name] [name], female    DOB: [birth year], 65 y.o.   MRN: [id number]  HPI Virtual Visit via Video Note  I connected with the patient on 10/21/20 at  3:00 PM EST by a video enabled telemedicine application and verified that I am speaking with the correct person using two identifiers.  Location patient: home Location provider:work or home office Persons participating in the virtual visit: patient, provider  I discussed the limitations of evaluation and management by telemedicine and the availability of in person appointments. The patient expressed understanding and agreed to proceed.   HPI: Here for 2 weeks of chest congestion and a dry cough. No SOB or chest pain. No fever or body aches. No NVD. Using Robitussin and Mucinex.    ROS: See pertinent positives and negatives per HPI.  Past Medical History:  Diagnosis Date  . Diabetes mellitus without complication ([place])   . Ruptured ovarian cyst     Past Surgical History:  Procedure Laterality Date  . CESAREAN SECTION    . COLONOSCOPY  11-25-11   per Dr. Sharlett Iles, diverticulosis only, repeat in 10 yrs   . lumpectomy,benign, right breast     x2    Family History  Problem Relation Age of Onset  . Cancer Other        breast cancer  . Colon cancer Neg Hx   . Esophageal cancer Neg Hx   . Stomach cancer Neg Hx   . Rectal cancer Neg Hx      Current Outpatient Medications:  .  azithromycin (ZITHROMAX Z-PAK) 250 MG tablet, As directed, Disp: 6 each, Rfl: 0 .  benzonatate (TESSALON) 200 MG capsule, Take 1 capsule (200 mg total) by mouth every 6 (six) hours as needed for cough., Disp: 60 capsule, Rfl: 1 .  glipiZIDE (GLUCOTROL) 5 MG tablet, Take 1 tablet (5 mg total) by mouth 2 (two) times daily before a meal., Disp: 60 tablet, Rfl: 5 .  halobetasol (ULTRAVATE) 0.05 % cream, apply to the affected topical area 2 times daily, Disp: 50 g, Rfl: 5 .  meclizine (ANTIVERT) 25 MG tablet, Take 1 tablet (25 mg total)  by mouth 3 (three) times daily as needed for dizziness or nausea., Disp: 30 tablet, Rfl: 0 .  metFORMIN (GLUCOPHAGE) 500 MG tablet, Take 1 tablet (500 mg total) by mouth daily with breakfast., Disp: 90 tablet, Rfl: 3 .  diazepam (VALIUM) 5 MG tablet, Take 1/2 to a whole tablet every 6 hours as needed for dizziness (Patient not taking: No sig reported), Disp: 60 tablet, Rfl: 1 .  diclofenac (VOLTAREN) 75 MG EC tablet, Take 1 tablet (75 mg total) by mouth 2 (two) times daily. (Patient not taking: Reported on 10/21/2020), Disp: 60 tablet, Rfl: 2 .  Diclofenac Sodium (PENNSAID) 2 % SOLN, [place] 20 mg/gram/actuation (2 %) topical soln in metered-dose pump  Apply 2 (two) pumps topically to affected area(s) twice a day (Patient not taking: No sig reported), Disp: , Rfl:  .  traMADol (ULTRAM) 50 MG tablet, Take 2 tablets (100 mg total) by mouth every 6 (six) hours as needed for moderate pain. (Patient not taking: No sig reported), Disp: 60 tablet, Rfl: 1  EXAM:  VITALS per patient if applicable:  GENERAL: alert, oriented, appears well and in no acute distress  HEENT: atraumatic, conjunttiva clear, no obvious abnormalities on inspection of external nose and ears  NECK: normal movements of the head and neck  LUNGS: on inspection no signs of  respiratory distress, breathing rate appears normal, no obvious gross SOB, gasping or wheezing  CV: no obvious cyanosis  MS: moves all visible extremities without noticeable abnormality  PSYCH/NEURO: pleasant and cooperative, no obvious depression or anxiety, speech and thought processing grossly intact  ASSESSMENT AND PLAN: Bronchitis, treat with a Zpack and Benzonatate as needed.  Alysia Penna, MD  Discussed the following assessment and plan:  No diagnosis found.     I discussed the assessment and treatment plan with the patient. The patient was provided an opportunity to ask questions and all were answered. The patient agreed with the plan and  demonstrated an understanding of the instructions.   The patient was advised to call back or seek an in-person evaluation if the symptoms worsen or if the condition fails to improve as anticipated.     Review of Systems     Objective:   Physical Exam        Assessment & Plan:

## 2020-10-21 NOTE — Telephone Encounter (Signed)
[facility] called to let Dr. Sarajane Jews know that the patients insurance only covers 3 tablets per day.   benzonatate (TESSALON) 200 MG capsule  [facility] # [place], [place] - [place] Phone:  [phone]  Fax:  [phone]

## 2020-10-22 NOTE — Telephone Encounter (Signed)
Spoke to the pharmacy yesterday and they filled it with the same quantity just advise the patient to take it every 8 hours.  Dm/cma

## 2020-10-22 NOTE — Telephone Encounter (Signed)
Please call them to change the sig to one every 8 hours prn cough

## 2020-10-31 ENCOUNTER — Other Ambulatory Visit: Payer: Self-pay

## 2021-01-28 ENCOUNTER — Encounter: Payer: 59 | Admitting: Family Medicine

## 2021-02-17 ENCOUNTER — Encounter: Payer: 59 | Admitting: Family Medicine

## 2021-03-04 ENCOUNTER — Ambulatory Visit (INDEPENDENT_AMBULATORY_CARE_PROVIDER_SITE_OTHER): Payer: 59 | Admitting: Family Medicine

## 2021-03-04 ENCOUNTER — Other Ambulatory Visit: Payer: Self-pay

## 2021-03-04 ENCOUNTER — Encounter: Payer: Self-pay | Admitting: Family Medicine

## 2021-03-04 VITALS — BP 130/80 | HR 70 | Temp 98.5°F | Ht 61.0 in | Wt 150.0 lb

## 2021-03-04 DIAGNOSIS — Z0000 Encounter for general adult medical examination without abnormal findings: Secondary | ICD-10-CM

## 2021-03-04 DIAGNOSIS — R1030 Lower abdominal pain, unspecified: Secondary | ICD-10-CM

## 2021-03-04 LAB — CBC WITH DIFFERENTIAL/PLATELET
Basophils Absolute: 0.1 10*3/uL (ref 0.0–0.1)
Basophils Relative: 0.8 % (ref 0.0–3.0)
Eosinophils Absolute: 0.2 10*3/uL (ref 0.0–0.7)
Eosinophils Relative: 2.7 % (ref 0.0–5.0)
HCT: 37.7 % (ref 36.0–46.0)
Hemoglobin: 12.5 g/dL (ref 12.0–15.0)
Lymphocytes Relative: 28.9 % (ref 12.0–46.0)
Lymphs Abs: 1.9 10*3/uL (ref 0.7–4.0)
MCHC: 33.1 g/dL (ref 30.0–36.0)
MCV: 83.6 fl (ref 78.0–100.0)
Monocytes Absolute: 0.4 10*3/uL (ref 0.1–1.0)
Monocytes Relative: 5.6 % (ref 3.0–12.0)
Neutro Abs: 4 10*3/uL (ref 1.4–7.7)
Neutrophils Relative %: 62 % (ref 43.0–77.0)
Platelets: 291 10*3/uL (ref 150.0–400.0)
RBC: 4.51 Mil/uL (ref 3.87–5.11)
RDW: 14.9 % (ref 11.5–15.5)
WBC: 6.4 10*3/uL (ref 4.0–10.5)

## 2021-03-04 LAB — HEPATIC FUNCTION PANEL
ALT: 9 U/L (ref 0–35)
AST: 16 U/L (ref 0–37)
Albumin: 4.1 g/dL (ref 3.5–5.2)
Alkaline Phosphatase: 99 U/L (ref 39–117)
Bilirubin, Direct: 0.1 mg/dL (ref 0.0–0.3)
Total Bilirubin: 0.4 mg/dL (ref 0.2–1.2)
Total Protein: 7.2 g/dL (ref 6.0–8.3)

## 2021-03-04 LAB — T3, FREE: T3, Free: 3.6 pg/mL (ref 2.3–4.2)

## 2021-03-04 LAB — TSH: TSH: 0.96 u[IU]/mL (ref 0.35–4.50)

## 2021-03-04 LAB — BASIC METABOLIC PANEL
BUN: 17 mg/dL (ref 6–23)
CO2: 27 mEq/L (ref 19–32)
Calcium: 8.8 mg/dL (ref 8.4–10.5)
Chloride: 105 mEq/L (ref 96–112)
Creatinine, Ser: 0.56 mg/dL (ref 0.40–1.20)
GFR: 97.17 mL/min (ref >60.00)
Glucose, Bld: 108 mg/dL — ABNORMAL HIGH (ref 70–99)
Potassium: 4.3 mEq/L (ref 3.5–5.1)
Sodium: 140 mEq/L (ref 135–145)

## 2021-03-04 LAB — LIPID PANEL
Cholesterol: 205 mg/dL — ABNORMAL HIGH (ref 0–200)
HDL: 55.1 mg/dL (ref >39.00)
LDL Cholesterol: 127 mg/dL — ABNORMAL HIGH (ref 0–99)
NonHDL: 149.97
Total CHOL/HDL Ratio: 4
Triglycerides: 113 mg/dL (ref 0.0–149.0)
VLDL: 22.6 mg/dL (ref 0.0–40.0)

## 2021-03-04 LAB — HEMOGLOBIN A1C: Hgb A1c MFr Bld: 7.7 % — ABNORMAL HIGH (ref 4.6–6.5)

## 2021-03-04 LAB — T4, FREE: Free T4: 0.9 ng/dL (ref 0.60–1.60)

## 2021-03-04 MED ORDER — MELOXICAM 15 MG PO TABS
15.0000 mg | ORAL_TABLET | Freq: Every day | ORAL | 3 refills | Status: DC
Start: 2021-03-04 — End: 2021-11-06

## 2021-03-04 NOTE — Progress Notes (Signed)
Subjective:    Patient ID: [name] [name], female    DOB: 01-Nov-1955, 65 y.o.   MRN: [id number]  HPI Here for a well exam. She has several concerns. First her arthritis pain has been more bothersome, especially in the shoulders, knees, and hips. She took Diclofenac a few years ago but this was stopped for some reason. She also has intermittent left flank pain and suprapubic discomfort. No urinary problems and her bowels move regularly. She saw [facility] recently and she had a normal pelvic exam and a normal pelvic US. She had upper and lower endoscopy 3 years ago which was apparently unremarkable. We do not have access to these results. She is worried about her kidneys.    Review of Systems  Constitutional: Negative.   HENT: Negative.    Eyes: Negative.   Respiratory: Negative.    Cardiovascular: Negative.   Gastrointestinal:  Positive for abdominal pain.  Genitourinary:  Positive for flank pain. Negative for decreased urine volume, difficulty urinating, dyspareunia, dysuria, enuresis, frequency, hematuria, pelvic pain and urgency.  Skin: Negative.   Neurological: Negative.  Negative for headaches.  Psychiatric/Behavioral: Negative.        Objective:   Physical Exam Constitutional:      General: She is not in acute distress.    Appearance: Normal appearance. She is well-developed.  HENT:     Head: Normocephalic and atraumatic.     Right Ear: External ear normal.     Left Ear: External ear normal.     Nose: Nose normal.     Mouth/Throat:     Pharynx: No oropharyngeal exudate.  Eyes:     General: No scleral icterus.    Conjunctiva/sclera: Conjunctivae normal.     Pupils: Pupils are equal, round, and reactive to light.  Neck:     Thyroid: No thyromegaly.     Vascular: No JVD.  Cardiovascular:     Rate and Rhythm: Normal rate and regular rhythm.     Heart sounds: Normal heart sounds. No murmur heard.   No friction rub. No gallop.  Pulmonary:     Effort: Pulmonary effort is  normal. No respiratory distress.     Breath sounds: Normal breath sounds. No wheezing or rales.  Chest:     Chest wall: No tenderness.  Abdominal:     General: Abdomen is flat. Bowel sounds are normal. There is no distension.     Palpations: Abdomen is soft. There is no mass.     Tenderness: There is no abdominal tenderness. There is no right CVA tenderness, left CVA tenderness, guarding or rebound.     Hernia: No hernia is present.  Musculoskeletal:        General: No tenderness. Normal range of motion.     Cervical back: Normal range of motion and neck supple.  Lymphadenopathy:     Cervical: No cervical adenopathy.  Skin:    General: Skin is warm and dry.     Findings: No erythema or rash.  Neurological:     Mental Status: She is alert and oriented to person, place, and time.     Cranial Nerves: No cranial nerve deficit.     Motor: No abnormal muscle tone.     Coordination: Coordination normal.     Deep Tendon Reflexes: Reflexes are normal and symmetric. Reflexes normal.  Psychiatric:        Behavior: Behavior normal.        Thought Content: Thought content normal.  Judgment: Judgment normal.          Assessment & Plan:  Well exam. We discussed diet and exercise. Get fasting labs. We will set up a CT renogram soon to evaluate the kidneys and bladder. She can try Meloxicam 15 mg daily for the OA. We will try to get records about her recent GI workup.  Alysia Penna, MD

## 2021-03-07 ENCOUNTER — Inpatient Hospital Stay: Admission: RE | Admit: 2021-03-07 | Payer: 59 | Source: Ambulatory Visit

## 2021-03-13 ENCOUNTER — Ambulatory Visit (INDEPENDENT_AMBULATORY_CARE_PROVIDER_SITE_OTHER)
Admission: RE | Admit: 2021-03-13 | Discharge: 2021-03-13 | Disposition: A | Discharge status: Home / Self Care | Payer: 59 | Source: Ambulatory Visit | Attending: Family Medicine | Admitting: Family Medicine

## 2021-03-13 ENCOUNTER — Other Ambulatory Visit: Payer: Self-pay

## 2021-03-13 DIAGNOSIS — R1030 Lower abdominal pain, unspecified: Secondary | ICD-10-CM

## 2021-03-31 ENCOUNTER — Other Ambulatory Visit: Payer: Self-pay

## 2021-03-31 ENCOUNTER — Encounter: Payer: Self-pay | Admitting: Family Medicine

## 2021-03-31 ENCOUNTER — Ambulatory Visit: Payer: 59 | Admitting: Family Medicine

## 2021-03-31 VITALS — BP 120/80 | HR 71 | Temp 98.8°F | Ht 61.0 in | Wt 149.0 lb

## 2021-03-31 DIAGNOSIS — E119 Type 2 diabetes mellitus without complications: Secondary | ICD-10-CM | POA: Diagnosis not present

## 2021-03-31 DIAGNOSIS — E785 Hyperlipidemia, unspecified: Secondary | ICD-10-CM | POA: Insufficient documentation

## 2021-03-31 NOTE — Progress Notes (Signed)
   Subjective:    Patient ID: [name] [name], female    DOB: [birth year], 65 y.o.   MRN: [id number]  HPI Here to follow up on lab results from her well exam on 03-04-21. These were all normal except for a mildly elevated LDL at 127 and her A1c had increased to 7.7%. we have increased her Metformin to 500 mg BID. Her abdominal and pelvic CT scan was unremarkable.    Review of Systems  Constitutional: Negative.   Respiratory: Negative.    Cardiovascular: Negative.       Objective:   Physical Exam Constitutional:      Appearance: Normal appearance.  Cardiovascular:     Rate and Rhythm: Normal rate and regular rhythm.     Pulses: Normal pulses.     Heart sounds: Normal heart sounds.  Pulmonary:     Effort: Pulmonary effort is normal.     Breath sounds: Normal breath sounds.  Neurological:     Mental Status: She is alert.          Assessment & Plan:  Her diabetes is under marginal control so we will increase the Metformin as above. For this and for the dyslipidemia, we reviewed dietary guidance. Recheck an A1c in 90  days. Alysia Penna, MD

## 2021-04-08 ENCOUNTER — Telehealth (INDEPENDENT_AMBULATORY_CARE_PROVIDER_SITE_OTHER): Payer: 59 | Admitting: Family Medicine

## 2021-04-08 ENCOUNTER — Encounter: Payer: Self-pay | Admitting: Family Medicine

## 2021-04-08 DIAGNOSIS — B349 Viral infection, unspecified: Secondary | ICD-10-CM

## 2021-04-08 NOTE — Progress Notes (Signed)
Subjective:    Patient ID: [name] [name], female    DOB: Jan 10, 1956, 65 y.o.   MRN: [id number]  HPI Virtual Visit via Video Note  I connected with the patient on 04/09/21 at  3:30 PM EDT by a video enabled telemedicine application and verified that I am speaking with the correct person using two identifiers.  Location patient: home Location provider:work or home office Persons participating in the virtual visit: patient, provider  I discussed the limitations of evaluation and management by telemedicine and the availability of in person appointments. The patient expressed understanding and agreed to proceed.   HPI: Here with a possible Covid-19 infection. For 2 days she has had headache, body aches, ST, a dry cough, low grade fevers,  and nausea. She is drinking fluids and taking Mucinex. Her husband took a Covid test yesterday and his returned as positive today. Jayce then took one today, and she expects the result to come back tomorrow morning.    ROS: See pertinent positives and negatives per HPI.  Past Medical History:  Diagnosis Date   Diabetes mellitus without complication ([place])    Ruptured ovarian cyst     Past Surgical History:  Procedure Laterality Date   CESAREAN SECTION     COLONOSCOPY  11-25-11   per Dr. Sharlett Iles, diverticulosis only, repeat in 10 yrs    lumpectomy,benign, right breast     x2    Family History  Problem Relation Age of Onset   Cancer Other        breast cancer   Colon cancer Neg Hx    Esophageal cancer Neg Hx    Stomach cancer Neg Hx    Rectal cancer Neg Hx      Current Outpatient Medications:    halobetasol (ULTRAVATE) 0.05 % cream, apply to the affected topical area 2 times daily (Patient taking differently: as needed.), Disp: 50 g, Rfl: 5   meloxicam (MOBIC) 15 MG tablet, Take 1 tablet (15 mg total) by mouth daily., Disp: 90 tablet, Rfl: 3   metFORMIN (GLUCOPHAGE) 500 MG tablet, Take 1 tablet (500 mg total) by mouth daily with  breakfast., Disp: 90 tablet, Rfl: 3  EXAM:  VITALS per patient if applicable:  GENERAL: alert, oriented, appears well and in no acute distress  HEENT: atraumatic, conjunttiva clear, no obvious abnormalities on inspection of external nose and ears  NECK: normal movements of the head and neck  LUNGS: on inspection no signs of respiratory distress, breathing rate appears normal, no obvious gross SOB, gasping or wheezing  CV: no obvious cyanosis  MS: moves all visible extremities without noticeable abnormality  PSYCH/NEURO: pleasant and cooperative, no obvious depression or anxiety, speech and thought processing grossly intact  ASSESSMENT AND PLAN: Viral illness, very likely to be Covid-19. She will quarantine at home with her husband, and she will contact us tomorrow with the result of her test. She may be a candidate for antiviral treatment. Alysia Penna, MD  Discussed the following assessment and plan:  No diagnosis found.     I discussed the assessment and treatment plan with the patient. The patient was provided an opportunity to ask questions and all were answered. The patient agreed with the plan and demonstrated an understanding of the instructions.   The patient was advised to call back or seek an in-person evaluation if the symptoms worsen or if the condition fails to improve as anticipated.      Review of Systems     Objective:  Physical Exam        Assessment & Plan:

## 2021-04-09 ENCOUNTER — Telehealth: Payer: Self-pay | Admitting: Family Medicine

## 2021-04-09 DIAGNOSIS — U071 COVID-19: Secondary | ICD-10-CM | POA: Insufficient documentation

## 2021-04-09 MED ORDER — NIRMATRELVIR/RITONAVIR (PAXLOVID)TABLET: 3.0000 | ORAL_TABLET | Freq: Two times a day (BID) | ORAL | 0 refills | Status: AC

## 2021-04-09 NOTE — Telephone Encounter (Signed)
I just sent a rx to [facility] for Paxlovid (the same thing her husband is taking)

## 2021-04-09 NOTE — Telephone Encounter (Signed)
Video visit- 04/08/21 Pharmacy updated

## 2021-04-09 NOTE — Telephone Encounter (Signed)
PT called back to see if Dr.Fry will call in anything as she is covid positive. She would like to have something called in as it will help her and would like a callback.

## 2021-04-09 NOTE — Telephone Encounter (Signed)
Pt call and stated she tested positive for covid today and want dr.fry to call her in the [facility]  for Covid sent to  [facility] # [address], [place] Phone:  [phone]  Fax:  [phone]

## 2021-04-09 NOTE — Telephone Encounter (Signed)
Spoke with pt state that she picked up Rx from [facility]

## 2021-04-18 ENCOUNTER — Telehealth (INDEPENDENT_AMBULATORY_CARE_PROVIDER_SITE_OTHER): Payer: 59 | Admitting: Family Medicine

## 2021-04-18 ENCOUNTER — Encounter: Payer: Self-pay | Admitting: Family Medicine

## 2021-04-18 DIAGNOSIS — R058 Other specified cough: Secondary | ICD-10-CM | POA: Diagnosis not present

## 2021-04-18 DIAGNOSIS — U071 COVID-19: Secondary | ICD-10-CM | POA: Diagnosis not present

## 2021-04-18 NOTE — Progress Notes (Signed)
Virtual Visit via Video Note Visit started via video however patient's audio was not working.  Patient called on phone to complete visit.  I connected with [name] [name] on 04/18/21 at  4:00 PM EDT by a video enabled telemedicine application 2/2 [id number] pandemic and verified that I am speaking with the correct person using two identifiers.  Location patient: home Location provider:work or home office Persons participating in the virtual visit: patient, provider  I discussed the limitations of evaluation and management by telemedicine and the availability of in person appointments. The patient expressed understanding and agreed to proceed.   HPI: Pt seen via e visit 04/08/21 for acute viral illness/suspected COVID infection.  Pt subsequently tested positive for COVID 04/10/2019.  Given rx for paxlovid.  Having productive cough with lots of phlegm.  Denies fever, chills, nausea, vomiting.  Tried left over tessalon and theraflu without relief.  Also drinking lots of tea.  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  ROS: See pertinent positives and negatives per HPI.  Past Medical History:  Diagnosis Date   Diabetes mellitus without complication ([place])    Ruptured ovarian cyst     Past Surgical History:  Procedure Laterality Date   CESAREAN SECTION     COLONOSCOPY  11-25-11   per Dr. Sharlett Iles, diverticulosis only, repeat in 10 yrs    lumpectomy,benign, right breast     x2    Family History  Problem Relation Age of Onset   Cancer Other        breast cancer   Colon cancer Neg Hx    Esophageal cancer Neg Hx    Stomach cancer Neg Hx    Rectal cancer Neg Hx       Current Outpatient Medications:    halobetasol (ULTRAVATE) 0.05 % cream, apply to the affected topical area 2 times daily (Patient taking differently: as needed.), Disp: 50 g, Rfl: 5   meloxicam (MOBIC) 15 MG tablet, Take 1 tablet (15 mg total) by mouth daily., Disp: 90 tablet, Rfl: 3   metFORMIN (GLUCOPHAGE) 500 MG  tablet, Take 1 tablet (500 mg total) by mouth daily with breakfast., Disp: 90 tablet, Rfl: 3  EXAM:  VITALS per patient if applicable: RR between 12 to 20 bpm  GENERAL: alert, oriented, appears well and in no acute distress  HEENT: atraumatic, conjunctiva clear, no obvious abnormalities on inspection of external nose and ears  NECK: normal movements of the head and neck  LUNGS: on inspection no signs of respiratory distress, breathing rate appears normal, no obvious gross SOB, gasping or wheezing  CV: no obvious cyanosis  MS: moves all visible extremities without noticeable abnormality  PSYCH/NEURO: pleasant and cooperative, no obvious depression or anxiety, speech and thought processing grossly intact  ASSESSMENT AND PLAN:  Discussed the following assessment and plan:  COVID-19 virus infection -Symptoms improving -s/p course of Paxlovid after positive COVID-19 virus test on 04/09/2021 -Continue supportive care -Given precautions  Post-viral cough syndrome -Advised on likely duration of symptoms -Continue supportive care including OTC cough/cold medications such as Delsym or Robitussin plain.  Okay to continue warm fluids, steam from shower, etc. -Given precautions  Follow-up as needed  I discussed the assessment and treatment plan with the patient. The patient was provided an opportunity to ask questions and all were answered. The patient agreed with the plan and demonstrated an understanding of the instructions.   The patient was advised to call back or seek an in-person evaluation if the symptoms worsen or  if the condition fails to improve as anticipated.  I provided 12 minutes of non-face-to-face time during this encounter.   Billie Ruddy, MD

## 2021-06-23 ENCOUNTER — Telehealth: Payer: Self-pay

## 2021-06-23 NOTE — Telephone Encounter (Signed)
Left detailed message for pt to call the office and schedule a lab appointment for her Pre Operative form for her surgery, pt needs A1C per Dr Sarajane Jews

## 2021-06-27 ENCOUNTER — Other Ambulatory Visit (INDEPENDENT_AMBULATORY_CARE_PROVIDER_SITE_OTHER): Payer: 59

## 2021-06-27 ENCOUNTER — Other Ambulatory Visit: Payer: Self-pay

## 2021-06-27 DIAGNOSIS — E119 Type 2 diabetes mellitus without complications: Secondary | ICD-10-CM

## 2021-06-27 LAB — HEMOGLOBIN A1C: Hgb A1c MFr Bld: 7.3 % — ABNORMAL HIGH (ref 4.6–6.5)

## 2021-07-02 ENCOUNTER — Other Ambulatory Visit: Payer: Self-pay

## 2021-07-02 DIAGNOSIS — E119 Type 2 diabetes mellitus without complications: Secondary | ICD-10-CM

## 2021-07-14 ENCOUNTER — Ambulatory Visit: Payer: Self-pay | Admitting: Student

## 2021-07-14 DIAGNOSIS — Z01818 Encounter for other preprocedural examination: Secondary | ICD-10-CM

## 2021-07-14 DIAGNOSIS — M1711 Unilateral primary osteoarthritis, right knee: Secondary | ICD-10-CM

## 2021-08-08 ENCOUNTER — Other Ambulatory Visit: Payer: Self-pay | Admitting: Family Medicine

## 2021-08-11 ENCOUNTER — Ambulatory Visit: Payer: 59 | Admitting: Family Medicine

## 2021-08-11 ENCOUNTER — Encounter: Payer: Self-pay | Admitting: Family Medicine

## 2021-08-11 VITALS — BP 142/84 | HR 70 | Temp 98.6°F | Wt 151.0 lb

## 2021-08-11 DIAGNOSIS — L600 Ingrowing nail: Secondary | ICD-10-CM

## 2021-08-11 DIAGNOSIS — M79675 Pain in left toe(s): Secondary | ICD-10-CM

## 2021-08-11 NOTE — Patient Instructions (Signed)
You can soak your foot in warm water and Epson salt.  Epson salt can be found over-the-counter at your local drugstore.  Continue to monitor for increased pain, warmth or drainage (pus) from the area.  If you notice any of the symptoms please contact clinic.

## 2021-08-11 NOTE — Progress Notes (Signed)
Subjective:    Patient ID: [name] [name], female    DOB: Nov 20, 1955, 65 y.o.   MRN: [id number]  Chief Complaint  Patient presents with   Toe Pain    Left toenail pain, states may have cut too short. Used alcohol     HPI Patient was seen today for ongoing concern who is followed by Dr. Sarajane Jews.  Patient endorses left third digit pain after cutting her toenail too short 1 month ago.  Patient endorses mild discomfort inside toenail so she recut it over the weekend at which time it bled became more painful.  Patient denies discharge from toenail, fever, chills.  Patient also endorses dry skin.  Using Vaseline on feet.  Not drinking any water daily as she does not like the taste.  Past Medical History:  Diagnosis Date   Diabetes mellitus without complication ([facility])    Ruptured ovarian cyst     Allergies  Allergen Reactions   Hydrocodone Bit-Homatrop Mbr Nausea And Vomiting    ROS General: Denies fever, chills, night sweats, changes in weight, changes in appetite HEENT: Denies headaches, ear pain, changes in vision, rhinorrhea, sore throat CV: Denies CP, palpitations, SOB, orthopnea Pulm: Denies SOB, cough, wheezing GI: Denies abdominal pain, nausea, vomiting, diarrhea, constipation GU: Denies dysuria, hematuria, frequency, vaginal discharge Msk: Denies muscle cramps, joint pains  + toe pain. Neuro: Denies weakness, numbness, tingling Skin: Denies rashes, bruising   Psych: Denies depression, anxiety, hallucinations     Objective:    Blood pressure (!) 142/84, pulse 70, temperature 98.6 F (37 C), temperature source Oral, weight 151 lb (68.5 kg), SpO2 99 %.  Gen. Pleasant, well-nourished, in no distress, normal affect   HEENT: [place]/AT, face symmetric, conjunctiva clear, no scleral icterus, PERRLA, EOMI, nares patent without drainage Lungs: no accessory muscle use Cardiovascular: RRR, no peripheral edema Musculoskeletal: No deformities, no cyanosis or clubbing, normal tone Neuro:  A&Ox3,  CN II-XII intact, normal gait Skin:  Warm, dry, and intact.  Left third toenail cut very short with mild erythema, dried blood on medial edge of toenail, and TTP.  Skin on plantar surface of foot dry.   Wt Readings from Last 3 Encounters:  08/11/21 151 lb (68.5 kg)  03/31/21 149 lb (67.6 kg)  03/04/21 150 lb (68 kg)    Lab Results  Component Value Date   WBC 6.4 03/04/2021   HGB 12.5 03/04/2021   HCT 37.7 03/04/2021   PLT 291.0 03/04/2021   GLUCOSE 108 (H) 03/04/2021   CHOL 205 (H) 03/04/2021   TRIG 113.0 03/04/2021   HDL 55.10 03/04/2021   LDLCALC 127 (H) 03/04/2021   ALT 9 03/04/2021   AST 16 03/04/2021   NA 140 03/04/2021   K 4.3 03/04/2021   CL 105 03/04/2021   CREATININE 0.56 03/04/2021   BUN 17 03/04/2021   CO2 27 03/04/2021   TSH 0.96 03/04/2021   HGBA1C 7.3 (H) 06/27/2021   MICROALBUR 0.5 06/28/2020    Assessment/Plan:  Pain of toe of left foot  Ingrown toenail  Patient advised to avoid cutting toenails too short or along the sides of the nails especially with h/o DM.  Can soak foot in warm water and Epsom salt.  Patient encouraged to increase p.o. intake of water for dry skin. Given handout.  Consider Podiatry referral.  F/u prn with pcp  Grier Mitts, MD

## 2021-08-18 ENCOUNTER — Ambulatory Visit: Payer: Self-pay | Admitting: Student

## 2021-08-18 NOTE — H&P (View-Only) (Signed)
TOTAL KNEE ADMISSION H&P  Patient is being admitted for right total knee arthroplasty.  Subjective:  Chief Complaint:right knee pain.  HPI: [name] [name], 65 y.o. female, has a history of pain and functional disability in the right knee due to arthritis and has failed non-surgical conservative treatments for greater than 12 weeks to includeNSAID's and/or analgesics, corticosteriod injections, and activity modification.  Onset of symptoms was gradual, starting 3 years ago with gradually worsening course since that time. The patient noted no past surgery on the right knee(s).  Patient currently rates pain in the right knee(s) at 9 out of 10 with activity. Patient has worsening of pain with activity and weight bearing, pain that interferes with activities of daily living, and pain with passive range of motion.  Patient has evidence of subchondral cysts, subchondral sclerosis, and joint space narrowing by imaging studies. There is no active infection.  Patient Active Problem List   Diagnosis Date Noted   COVID-19 virus infection 04/09/2021   Dyslipidemia 03/31/2021   Urinary frequency 10/29/2017   Diabetes mellitus without complication ([place]) 76/72/0947   Special screening for malignant neoplasms, colon 11/25/2011   Diverticulosis of colon (without mention of hemorrhage) 11/25/2011   URTICARIA 01/31/2010   ECZEMA 05/03/2009   Past Medical History:  Diagnosis Date   Diabetes mellitus without complication ([place])    Ruptured ovarian cyst     Past Surgical History:  Procedure Laterality Date   CESAREAN SECTION     COLONOSCOPY  11-25-11   per Dr. Sharlett Iles, diverticulosis only, repeat in 10 yrs    lumpectomy,benign, right breast     x2    Current Outpatient Medications  Medication Sig Dispense Refill Last Dose   halobetasol (ULTRAVATE) 0.05 % cream apply to the affected topical area 2 times daily (Patient not taking: Reported on 08/11/2021) 50 g 5    meloxicam (MOBIC) 15 MG tablet Take 1 tablet  (15 mg total) by mouth daily. (Patient not taking: Reported on 08/11/2021) 90 tablet 3    metFORMIN (GLUCOPHAGE) 500 MG tablet TAKE ONE TABLET BY MOUTH DAILY WITH BREAKFAST 90 tablet 0    No current facility-administered medications for this visit.   Allergies  Allergen Reactions   Hydrocodone Bit-Homatrop Mbr Nausea And Vomiting    Social History   Tobacco Use   Smoking status: Never   Smokeless tobacco: Never  Substance Use Topics   Alcohol use: Yes    Alcohol/week: 0.0 standard drinks    Comment: maybe twice a month    Family History  Problem Relation Age of Onset   Cancer Other        breast cancer   Colon cancer Neg Hx    Esophageal cancer Neg Hx    Stomach cancer Neg Hx    Rectal cancer Neg Hx      Review of Systems  Musculoskeletal:  Positive for arthralgias.  All other systems reviewed and are negative.  Objective:  Physical Exam HENT:     Head: Normocephalic.  Eyes:     Pupils: Pupils are equal, round, and reactive to light.  Cardiovascular:     Rate and Rhythm: Normal rate.  Pulmonary:     Effort: Pulmonary effort is normal.  Abdominal:     Palpations: Abdomen is soft.  Genitourinary:    Comments: Deferred Musculoskeletal:        General: Tenderness present.     Cervical back: Normal range of motion.  Skin:    General: Skin is warm.  Neurological:  Mental Status: She is alert and oriented to person, place, and time.  Psychiatric:        Behavior: Behavior normal.    Vital signs in last 24 hours: @VSRANGES @  Labs:   Estimated body mass index is 28.53 kg/m as calculated from the following:   Height as of 03/31/21: 5\' 1"  (1.549 m).   Weight as of 08/11/21: 68.5 kg.   Imaging Review Plain radiographs demonstrate severe degenerative joint disease of the right knee(s). The bone quality appears to be adequate for age and reported activity level.      Assessment/Plan:  End stage arthritis, right knee   The patient history, physical  examination, clinical judgment of the provider and imaging studies are consistent with end stage degenerative joint disease of the right knee(s) and total knee arthroplasty is deemed medically necessary. The treatment options including medical management, injection therapy arthroscopy and arthroplasty were discussed at length. The risks and benefits of total knee arthroplasty were presented and reviewed. The risks due to aseptic loosening, infection, stiffness, patella tracking problems, thromboembolic complications and other imponderables were discussed. The patient acknowledged the explanation, agreed to proceed with the plan and consent was signed. Patient is being admitted for inpatient treatment for surgery, pain control, PT, OT, prophylactic antibiotics, VTE prophylaxis, progressive ambulation and ADL's and discharge planning. The patient is planning to be discharged  home after overnight observation     Patient's anticipated LOS is less than 2 midnights, meeting these requirements: - Younger than 66 - Lives within 1 hour of care - Has a competent adult at home to recover with post-op recover - NO history of  - Chronic pain requiring opiods  - Diabetes  - Coronary Artery Disease  - Heart failure  - Heart attack  - Stroke  - DVT/VTE  - Cardiac arrhythmia  - Respiratory Failure/COPD  - Renal failure  - Anemia  - Advanced Liver disease

## 2021-08-18 NOTE — H&P (Signed)
TOTAL KNEE ADMISSION H&P  Patient is being admitted for right total knee arthroplasty.  Subjective:  Chief Complaint:right knee pain.  HPI: [name] [name], 65 y.o. female, has a history of pain and functional disability in the right knee due to arthritis and has failed non-surgical conservative treatments for greater than 12 weeks to includeNSAID's and/or analgesics, corticosteriod injections, and activity modification.  Onset of symptoms was gradual, starting 3 years ago with gradually worsening course since that time. The patient noted no past surgery on the right knee(s).  Patient currently rates pain in the right knee(s) at 9 out of 10 with activity. Patient has worsening of pain with activity and weight bearing, pain that interferes with activities of daily living, and pain with passive range of motion.  Patient has evidence of subchondral cysts, subchondral sclerosis, and joint space narrowing by imaging studies. There is no active infection.  Patient Active Problem List   Diagnosis Date Noted   COVID-19 virus infection 04/09/2021   Dyslipidemia 03/31/2021   Urinary frequency 10/29/2017   Diabetes mellitus without complication ([place]) 11/94/1740   Special screening for malignant neoplasms, colon 11/25/2011   Diverticulosis of colon (without mention of hemorrhage) 11/25/2011   URTICARIA 01/31/2010   ECZEMA 05/03/2009   Past Medical History:  Diagnosis Date   Diabetes mellitus without complication ([place])    Ruptured ovarian cyst     Past Surgical History:  Procedure Laterality Date   CESAREAN SECTION     COLONOSCOPY  11-25-11   per Dr. Sharlett Iles, diverticulosis only, repeat in 10 yrs    lumpectomy,benign, right breast     x2    Current Outpatient Medications  Medication Sig Dispense Refill Last Dose   halobetasol (ULTRAVATE) 0.05 % cream apply to the affected topical area 2 times daily (Patient not taking: Reported on 08/11/2021) 50 g 5    meloxicam (MOBIC) 15 MG tablet Take 1 tablet  (15 mg total) by mouth daily. (Patient not taking: Reported on 08/11/2021) 90 tablet 3    metFORMIN (GLUCOPHAGE) 500 MG tablet TAKE ONE TABLET BY MOUTH DAILY WITH BREAKFAST 90 tablet 0    No current facility-administered medications for this visit.   Allergies  Allergen Reactions   Hydrocodone Bit-Homatrop Mbr Nausea And Vomiting    Social History   Tobacco Use   Smoking status: Never   Smokeless tobacco: Never  Substance Use Topics   Alcohol use: Yes    Alcohol/week: 0.0 standard drinks    Comment: maybe twice a month    Family History  Problem Relation Age of Onset   Cancer Other        breast cancer   Colon cancer Neg Hx    Esophageal cancer Neg Hx    Stomach cancer Neg Hx    Rectal cancer Neg Hx      Review of Systems  Musculoskeletal:  Positive for arthralgias.  All other systems reviewed and are negative.  Objective:  Physical Exam HENT:     Head: Normocephalic.  Eyes:     Pupils: Pupils are equal, round, and reactive to light.  Cardiovascular:     Rate and Rhythm: Normal rate.  Pulmonary:     Effort: Pulmonary effort is normal.  Abdominal:     Palpations: Abdomen is soft.  Genitourinary:    Comments: Deferred Musculoskeletal:        General: Tenderness present.     Cervical back: Normal range of motion.  Skin:    General: Skin is warm.  Neurological:  Mental Status: She is alert and oriented to person, place, and time.  Psychiatric:        Behavior: Behavior normal.    Vital signs in last 24 hours: @VSRANGES @  Labs:   Estimated body mass index is 28.53 kg/m as calculated from the following:   Height as of 03/31/21: 5\' 1"  (1.549 m).   Weight as of 08/11/21: 68.5 kg.   Imaging Review Plain radiographs demonstrate severe degenerative joint disease of the right knee(s). The bone quality appears to be adequate for age and reported activity level.      Assessment/Plan:  End stage arthritis, right knee   The patient history, physical  examination, clinical judgment of the provider and imaging studies are consistent with end stage degenerative joint disease of the right knee(s) and total knee arthroplasty is deemed medically necessary. The treatment options including medical management, injection therapy arthroscopy and arthroplasty were discussed at length. The risks and benefits of total knee arthroplasty were presented and reviewed. The risks due to aseptic loosening, infection, stiffness, patella tracking problems, thromboembolic complications and other imponderables were discussed. The patient acknowledged the explanation, agreed to proceed with the plan and consent was signed. Patient is being admitted for inpatient treatment for surgery, pain control, PT, OT, prophylactic antibiotics, VTE prophylaxis, progressive ambulation and ADL's and discharge planning. The patient is planning to be discharged  home after overnight observation     Patient's anticipated LOS is less than 2 midnights, meeting these requirements: - Younger than 27 - Lives within 1 hour of care - Has a competent adult at home to recover with post-op recover - NO history of  - Chronic pain requiring opiods  - Diabetes  - Coronary Artery Disease  - Heart failure  - Heart attack  - Stroke  - DVT/VTE  - Cardiac arrhythmia  - Respiratory Failure/COPD  - Renal failure  - Anemia  - Advanced Liver disease

## 2021-08-19 NOTE — Patient Instructions (Signed)
DUE TO COVID-19 ONLY ONE VISITOR IS ALLOWED TO COME WITH YOU AND STAY IN THE WAITING ROOM ONLY DURING PRE OP AND PROCEDURE DAY OF SURGERY.   Up to two visitors ages 16+ are allowed at one time in a patient's room.  The visitors may rotate out with other people throughout the day.  Additionally, up to two children between the ages of 5 and 63 are allowed and do not count toward the number of allowed visitors.  Children within this age range must be accompanied by an adult visitor.  One adult visitor may remain with the patient overnight and must be in the room by 8 PM.          Your procedure is scheduled on: 08-27-21   Report to [facility] Main  Entrance   Report to admitting at         [place]  AM     Call this number if you have problems the morning of surgery [phone]   Remember: NO SOLID FOOD AFTER MIDNIGHT THE NIGHT PRIOR TO SURGERY. NOTHING BY MOUTH EXCEPT CLEAR LIQUIDS UNTIL   0830 am .  PLEASE FINISH ENSURE DRINK PER SURGEON ORDER  WHICH NEEDS TO BE COMPLETED AT       0830 am then nothing by mouth .     CLEAR LIQUID DIET                                                                    water Black Coffee and tea, regular and decaf No Creamer                            Plain Jell-O any favor except red or purple                                  Fruit ices (not with fruit pulp)                                      Iced Popsicles                                     Carbonated beverages, regular and diet                                    Cranberry, grape and apple juices Sports drinks like Gatorade Lightly seasoned clear broth or consume(fat free) Sugar, honey syrup   _____________________________________________________________________     BRUSH YOUR TEETH MORNING OF SURGERY AND RINSE YOUR MOUTH OUT, NO CHEWING GUM CANDY OR MINTS.     Take these medicines the morning of surgery with A SIP OF WATER:   DO NOT TAKE ANY DIABETIC MEDICATIONS DAY OF YOUR  SURGERY                               You  may not have any metal on your body including hair pins and              piercings  Do not wear jewelry, make-up, lotions, powders,perfumes,    or    deodorant             Do not wear nail polish on your fingernails or toenails .  Do not shave  48 hours prior to surgery.              Men may shave face and neck.   Do not bring valuables to the hospital. [name].  Contacts, dentures or bridgework may not be worn into surgery.       Patients discharged the day of surgery will not be allowed to drive home. IF YOU ARE HAVING SURGERY AND GOING HOME THE SAME DAY, YOU MUST HAVE AN ADULT TO DRIVE YOU HOME AND BE WITH YOU FOR 24 HOURS. YOU MAY GO HOME BY TAXI OR UBER OR ORTHERWISE, BUT AN ADULT MUST ACCOMPANY YOU HOME AND STAY WITH YOU FOR 24 HOURS.  Name and phone number of your driver:  Special Instructions: N/A              Please read over the following fact sheets you were given: _____________________________________________________________________             [facility] - Preparing for Surgery Before surgery, you can play an important role.  Because skin is not sterile, your skin needs to be as free of germs as possible.  You can reduce the number of germs on your skin by washing with CHG (chlorahexidine gluconate) soap before surgery.  CHG is an antiseptic cleaner which kills germs and bonds with the skin to continue killing germs even after washing. Please DO NOT use if you have an allergy to CHG or antibacterial soaps.  If your skin becomes reddened/irritated stop using the CHG and inform your nurse when you arrive at Short Stay. Do not shave (including legs and underarms) for at least 48 hours prior to the first CHG shower.  You may shave your face/neck. Please follow these instructions carefully:  1.  Shower with CHG Soap the night before surgery and the  morning of Surgery.  2.  If you choose  to wash your hair, wash your hair first as usual with your  normal  shampoo.  3.  After you shampoo, rinse your hair and body thoroughly to remove the  shampoo.                           4.  Use CHG as you would any other liquid soap.  You can apply chg directly  to the skin and wash                       Gently with a scrungie or clean washcloth.  5.  Apply the CHG Soap to your body ONLY FROM THE NECK DOWN.   Do not use on face/ open                           Wound or open sores. Avoid contact with eyes, ears mouth and genitals (private parts).  Wash face,  Genitals (private parts) with your normal soap.             6.  Wash thoroughly, paying special attention to the area where your surgery  will be performed.  7.  Thoroughly rinse your body with warm water from the neck down.  8.  DO NOT shower/wash with your normal soap after using and rinsing off  the CHG Soap.                9.  Pat yourself dry with a clean towel.            10.  Wear clean pajamas.            11.  Place clean sheets on your bed the night of your first shower and do not  sleep with pets. Day of Surgery : Do not apply any lotions/deodorants the morning of surgery.  Please wear clean clothes to the hospital/surgery center.  FAILURE TO FOLLOW THESE INSTRUCTIONS MAY RESULT IN THE CANCELLATION OF YOUR SURGERY PATIENT SIGNATURE_________________________________  NURSE SIGNATURE__________________________________  ________________________________________________________________________    [name] [name]  An incentive spirometer is a tool that can help keep your lungs clear and active. This tool measures how well you are filling your lungs with each breath. Taking long deep breaths may help reverse or decrease the chance of developing breathing (pulmonary) problems (especially infection) following: A long period of time when you are unable to move or be active. BEFORE THE PROCEDURE  If the  spirometer includes an indicator to show your best effort, your nurse or respiratory therapist will set it to a desired goal. If possible, sit up straight or lean slightly forward. Try not to slouch. Hold the incentive spirometer in an upright position. INSTRUCTIONS FOR USE  Sit on the edge of your bed if possible, or sit up as far as you can in bed or on a chair. Hold the incentive spirometer in an upright position. Breathe out normally. Place the mouthpiece in your mouth and seal your lips tightly around it. Breathe in slowly and as deeply as possible, raising the piston or the ball toward the top of the column. Hold your breath for 3-5 seconds or for as long as possible. Allow the piston or ball to fall to the bottom of the column. Remove the mouthpiece from your mouth and breathe out normally. Rest for a few seconds and repeat Steps 1 through 7 at least 10 times every 1-2 hours when you are awake. Take your time and take a few normal breaths between deep breaths. The spirometer may include an indicator to show your best effort. Use the indicator as a goal to work toward during each repetition. After each set of 10 deep breaths, practice coughing to be sure your lungs are clear. If you have an incision (the cut made at the time of surgery), support your incision when coughing by placing a pillow or rolled up towels firmly against it. Once you are able to get out of bed, walk around indoors and cough well. You may stop using the incentive spirometer when instructed by your caregiver.  RISKS AND COMPLICATIONS Take your time so you do not get dizzy or light-headed. If you are in pain, you may need to take or ask for pain medication before doing incentive spirometry. It is harder to take a deep breath if you are having pain. AFTER USE Rest and breathe slowly and easily. It can be helpful to keep track  of a log of your progress. Your caregiver can provide you with a simple table to help with  this. If you are using the spirometer at home, follow these instructions: [place] IF:  You are having difficultly using the spirometer. You have trouble using the spirometer as often as instructed. Your pain medication is not giving enough relief while using the spirometer. You develop fever of 100.5 F (38.1 C) or higher. SEEK IMMEDIATE MEDICAL CARE IF:  You cough up bloody sputum that had not been present before. You develop fever of 102 F (38.9 C) or greater. You develop worsening pain at or near the incision site. MAKE SURE YOU:  Understand these instructions. Will watch your condition. Will get help right away if you are not doing well or get worse. Document Released: 01/04/2007 Document Revised: 11/16/2011 Document Reviewed: 03/07/2007 [facility] Patient Information 2014 [place], [place].   ________________________________________________________________________

## 2021-08-19 NOTE — Progress Notes (Signed)
PCP -  Cardiologist -   PPM/ICD -  Device Orders -  Rep Notified -   Chest x-ray -  EKG -  Stress Test -  ECHO -  Cardiac Cath -   Sleep Study -  CPAP -   Fasting Blood Sugar -  Checks Blood Sugar _____ times a day  Blood Thinner Instructions: Aspirin Instructions:  ERAS Protcol - PRE-SURGERY Ensure or G2-   COVID TEST- n/a COVID vaccine -  Activity-- Anesthesia review: DM  Patient denies shortness of breath, fever, cough and chest pain at PAT appointment   All instructions explained to the patient, with a verbal understanding of the material. Patient agrees to go over the instructions while at home for a better understanding. Patient also instructed to self quarantine after being tested for COVID-19. The opportunity to ask questions was provided.

## 2021-08-22 NOTE — Progress Notes (Signed)
.                 Your procedure is scheduled on:       08/27/2021    Report to [facility] Main  Entrance   Report to admitting at    [phone]     Call this number if you have problems the morning of surgery [phone]    REMEMBER: NO  SOLID FOOD CANDY OR GUM AFTER MIDNIGHT. CLEAR LIQUIDS UNTIL    0830am       . NOTHING BY MOUTH EXCEPT CLEAR LIQUIDS UNTIL 0830am    . PLEASE FINISH  G2 Lower  sugar  DRINK PER SURGEON ORDER  WHICH NEEDS TO BE COMPLETED AT  0830am     .      CLEAR LIQUID DIET   Foods Allowed                                                                    Coffee and tea, regular and decaf                            Fruit ices (not with fruit pulp)                                      Iced Popsicles                                    Carbonated beverages, regular and diet                                    Cranberry, grape and apple juices Sports drinks like Gatorade Lightly seasoned clear broth or consume(fat free) Sugar, honey syrup ___________________________________________________________________      BRUSH YOUR TEETH MORNING OF SURGERY AND RINSE YOUR MOUTH OUT, NO CHEWING GUM CANDY OR MINTS.     Take these medicines the morning of surgery with A SIP OF WATER:  none   DO NOT TAKE ANY DIABETIC MEDICATIONS DAY OF YOUR SURGERY                               You may not have any metal on your body including hair pins and              piercings  Do not wear jewelry, make-up, lotions, powders or perfumes, deodorant             Do not wear nail polish on your fingernails.  Do not shave  48 hours prior to surgery.              Men may shave face and neck.   Do not bring valuables to the hospital. [place].  Contacts, dentures or bridgework may not be worn into surgery.  Leave suitcase in the car. After  surgery it may be brought to your room.     Patients discharged the day of surgery will not be  allowed to drive home. IF YOU ARE HAVING SURGERY AND GOING HOME THE SAME DAY, YOU MUST HAVE AN ADULT TO DRIVE YOU HOME AND BE WITH YOU FOR 24 HOURS. YOU MAY GO HOME BY TAXI OR UBER OR ORTHERWISE, BUT AN ADULT MUST ACCOMPANY YOU HOME AND STAY WITH YOU FOR 24 HOURS.  Name and phone number of your driver:  Special Instructions: N/A              Please read over the following fact sheets you were given: _____________________________________________________________________  [facility]  - Preparing for Surgery Before surgery, you can play an important role.  Because skin is not sterile, your skin needs to be as free of germs as possible.  You can reduce the number of germs on your skin by washing with CHG (chlorahexidine gluconate) soap before surgery.  CHG is an antiseptic cleaner which kills germs and bonds with the skin to continue killing germs even after washing. Please DO NOT use if you have an allergy to CHG or antibacterial soaps.  If your skin becomes reddened/irritated stop using the CHG and inform your nurse when you arrive at Short Stay. Do not shave (including legs and underarms) for at least 48 hours prior to the first CHG shower.  You may shave your face/neck. Please follow these instructions carefully:  1.  Shower with CHG Soap the night before surgery and the  morning of Surgery.  2.  If you choose to wash your hair, wash your hair first as usual with your  normal  shampoo.  3.  After you shampoo, rinse your hair and body thoroughly to remove the  shampoo.                           4.  Use CHG as you would any other liquid soap.  You can apply chg directly  to the skin and wash                       Gently with a scrungie or clean washcloth.  5.  Apply the CHG Soap to your body ONLY FROM THE NECK DOWN.   Do not use on face/ open                           Wound or open sores. Avoid contact with eyes, ears mouth and genitals (private parts).                       Wash face,  Genitals  (private parts) with your normal soap.             6.  Wash thoroughly, paying special attention to the area where your surgery  will be performed.  7.  Thoroughly rinse your body with warm water from the neck down.  8.  DO NOT shower/wash with your normal soap after using and rinsing off  the CHG Soap.                9.  Pat yourself dry with a clean towel.            10.  Wear clean pajamas.            11.  Place clean sheets on your bed  the night of your first shower and do not  sleep with pets. Day of Surgery : Do not apply any lotions/deodorants the morning of surgery.  Please wear clean clothes to the hospital/surgery center.  FAILURE TO FOLLOW THESE INSTRUCTIONS MAY RESULT IN THE CANCELLATION OF YOUR SURGERY PATIENT SIGNATURE_________________________________  NURSE SIGNATURE__________________________________  ________________________________________________________________________

## 2021-08-22 NOTE — Progress Notes (Addendum)
Anesthesia Review:  PCP: DR Cherlynn Perches  Cardiologist : Chest x-ray : EKG : 08/25/21  Echo : Stress test: Cardiac Cath :  Activity level: CAN DO A FLIGHT OF STAIRS WITHOUT DIFFICULTY  Sleep Study/ CPAP : NONE  Fasting Blood Sugar :      / Checks Blood Sugar -- times a day:   Blood Thinner/ Instructions /Last Dose: ASA / Instructions/ Last Dose :   DM- type 2 - DOES NOT CHECK GLUCOSE AT HOME  Hgba1c- 08/25/21 - 7.2  NO COVID TEST AMBULATORY SURGERY

## 2021-08-25 ENCOUNTER — Other Ambulatory Visit: Payer: Self-pay

## 2021-08-25 ENCOUNTER — Encounter (HOSPITAL_COMMUNITY)
Admission: RE | Admit: 2021-08-25 | Discharge: 2021-08-25 | Disposition: A | Discharge status: Home / Self Care | Payer: 59 | Source: Ambulatory Visit | Attending: Orthopedic Surgery | Admitting: Orthopedic Surgery

## 2021-08-25 ENCOUNTER — Encounter (HOSPITAL_COMMUNITY): Payer: Self-pay

## 2021-08-25 VITALS — BP 134/65 | HR 64 | Temp 98.3°F | Resp 18 | Ht 61.0 in | Wt 147.5 lb

## 2021-08-25 DIAGNOSIS — Z01818 Encounter for other preprocedural examination: Secondary | ICD-10-CM | POA: Diagnosis present

## 2021-08-25 DIAGNOSIS — M1711 Unilateral primary osteoarthritis, right knee: Secondary | ICD-10-CM | POA: Insufficient documentation

## 2021-08-25 HISTORY — DX: Unspecified osteoarthritis, unspecified site: M19.90

## 2021-08-25 LAB — CBC
HCT: 39.8 % (ref 36.0–46.0)
Hemoglobin: 12.6 g/dL (ref 12.0–15.0)
MCH: 27.5 pg (ref 26.0–34.0)
MCHC: 31.7 g/dL (ref 30.0–36.0)
MCV: 86.7 fL (ref 80.0–100.0)
Platelets: 269 10*3/uL (ref 150–400)
RBC: 4.59 MIL/uL (ref 3.87–5.11)
RDW: 13.6 % (ref 11.5–15.5)
WBC: 5.6 10*3/uL (ref 4.0–10.5)
nRBC: 0 % (ref 0.0–0.2)

## 2021-08-25 LAB — COMPREHENSIVE METABOLIC PANEL
ALT: 11 U/L (ref 0–44)
AST: 23 U/L (ref 15–41)
Albumin: 4 g/dL (ref 3.5–5.0)
Alkaline Phosphatase: 86 U/L (ref 38–126)
Anion gap: 9 (ref 5–15)
BUN: 14 mg/dL (ref 8–23)
CO2: 24 mmol/L (ref 22–32)
Calcium: 8.6 mg/dL — ABNORMAL LOW (ref 8.9–10.3)
Chloride: 105 mmol/L (ref 98–111)
Creatinine, Ser: 0.49 mg/dL (ref 0.44–1.00)
GFR, Estimated: >60 mL/min (ref >60)
Glucose, Bld: 108 mg/dL — ABNORMAL HIGH (ref 70–99)
Potassium: 3.8 mmol/L (ref 3.5–5.1)
Sodium: 138 mmol/L (ref 135–145)
Total Bilirubin: 0.8 mg/dL (ref 0.3–1.2)
Total Protein: 7.4 g/dL (ref 6.5–8.1)

## 2021-08-25 LAB — HEMOGLOBIN A1C
Hgb A1c MFr Bld: 7.2 % — ABNORMAL HIGH (ref 4.8–5.6)
Mean Plasma Glucose: 159.94 mg/dL

## 2021-08-25 LAB — SURGICAL PCR SCREEN
MRSA, PCR: NEGATIVE
Staphylococcus aureus: NEGATIVE

## 2021-08-25 LAB — PROTIME-INR
INR: 1 (ref 0.8–1.2)
Prothrombin Time: 13.4 seconds (ref 11.4–15.2)

## 2021-08-25 LAB — GLUCOSE, CAPILLARY: Glucose-Capillary: 112 mg/dL — ABNORMAL HIGH (ref 70–99)

## 2021-08-27 ENCOUNTER — Other Ambulatory Visit: Payer: Self-pay

## 2021-08-27 ENCOUNTER — Encounter (HOSPITAL_COMMUNITY): Payer: Self-pay | Admitting: Orthopedic Surgery

## 2021-08-27 ENCOUNTER — Ambulatory Visit (HOSPITAL_COMMUNITY): Payer: 59 | Admitting: Anesthesiology

## 2021-08-27 ENCOUNTER — Ambulatory Visit (HOSPITAL_COMMUNITY): Payer: 59 | Admitting: Physician Assistant

## 2021-08-27 ENCOUNTER — Ambulatory Visit (HOSPITAL_COMMUNITY): Payer: 59

## 2021-08-27 ENCOUNTER — Ambulatory Visit (HOSPITAL_COMMUNITY)
Admission: RE | Admit: 2021-08-27 | Discharge: 2021-08-28 | Disposition: A | Discharge status: Home / Self Care | Payer: 59 | Source: Ambulatory Visit | Attending: Orthopedic Surgery | Admitting: Orthopedic Surgery

## 2021-08-27 ENCOUNTER — Encounter (HOSPITAL_COMMUNITY)
Admission: RE | Disposition: A | Discharge status: Home / Self Care | Payer: Self-pay | Source: Ambulatory Visit | Attending: Orthopedic Surgery

## 2021-08-27 DIAGNOSIS — Z96651 Presence of right artificial knee joint: Secondary | ICD-10-CM | POA: Diagnosis present

## 2021-08-27 DIAGNOSIS — M1711 Unilateral primary osteoarthritis, right knee: Secondary | ICD-10-CM | POA: Diagnosis not present

## 2021-08-27 HISTORY — PX: KNEE ARTHROPLASTY: SHX992

## 2021-08-27 LAB — GLUCOSE, CAPILLARY
Glucose-Capillary: 116 mg/dL — ABNORMAL HIGH (ref 70–99)
Glucose-Capillary: 113 mg/dL — ABNORMAL HIGH (ref 70–99)
Glucose-Capillary: 180 mg/dL — ABNORMAL HIGH (ref 70–99)
Glucose-Capillary: 152 mg/dL — ABNORMAL HIGH (ref 70–99)

## 2021-08-27 LAB — TYPE AND SCREEN
ABO/RH(D): O POS
Antibody Screen: NEGATIVE

## 2021-08-27 LAB — ABO/RH: ABO/RH(D): O POS

## 2021-08-27 SURGERY — ARTHROPLASTY, KNEE, TOTAL, USING IMAGELESS COMPUTER-ASSISTED NAVIGATION
Anesthesia: Monitor Anesthesia Care | Site: Knee | Laterality: Right

## 2021-08-27 MED ORDER — HYDROMORPHONE HCL 1 MG/ML IJ SOLN
INTRAMUSCULAR | Status: AC
  Administered 2021-08-27: 14:00:00 0.5 mg via INTRAVENOUS
  Filled 2021-08-27: qty 1

## 2021-08-27 MED ORDER — DEXAMETHASONE SODIUM PHOSPHATE 10 MG/ML IJ SOLN
INTRAMUSCULAR | Status: AC
  Filled 2021-08-27: qty 1

## 2021-08-27 MED ORDER — LACTATED RINGERS IV BOLUS
250.0000 mL | Freq: Once | INTRAVENOUS | Status: AC
  Administered 2021-08-27: 15:00:00 250 mL via INTRAVENOUS

## 2021-08-27 MED ORDER — OXYCODONE HCL 5 MG PO TABS: 10.0000 mg | ORAL_TABLET | ORAL | Status: DC | PRN

## 2021-08-27 MED ORDER — ACETAMINOPHEN 10 MG/ML IV SOLN
1000.0000 mg | Freq: Once | INTRAVENOUS | Status: AC
  Administered 2021-08-27: 13:00:00 1000 mg via INTRAVENOUS
  Filled 2021-08-27: qty 100

## 2021-08-27 MED ORDER — CHLORHEXIDINE GLUCONATE 0.12 % MT SOLN
15.0000 mL | Freq: Once | OROMUCOSAL | Status: AC
  Administered 2021-08-27: 10:00:00 15 mL via OROMUCOSAL

## 2021-08-27 MED ORDER — INSULIN ASPART 100 UNIT/ML IJ SOLN
0.0000 [IU] | Freq: Three times a day (TID) | INTRAMUSCULAR | Status: DC
  Administered 2021-08-27: 18:00:00 2 [IU] via SUBCUTANEOUS
  Administered 2021-08-28: 10:00:00 1 [IU] via SUBCUTANEOUS

## 2021-08-27 MED ORDER — ALUM & MAG HYDROXIDE-SIMETH 200-200-20 MG/5ML PO SUSP: 30.0000 mL | ORAL | Status: DC | PRN

## 2021-08-27 MED ORDER — DEXAMETHASONE SODIUM PHOSPHATE 10 MG/ML IJ SOLN
10.0000 mg | Freq: Once | INTRAMUSCULAR | Status: AC
  Administered 2021-08-28: 10:00:00 10 mg via INTRAVENOUS
  Filled 2021-08-27: qty 1

## 2021-08-27 MED ORDER — ONDANSETRON HCL 4 MG/2ML IJ SOLN
INTRAMUSCULAR | Status: AC
  Filled 2021-08-27: qty 2

## 2021-08-27 MED ORDER — PROPOFOL 10 MG/ML IV BOLUS
INTRAVENOUS | Status: DC | PRN
  Administered 2021-08-27: 150 mg via INTRAVENOUS

## 2021-08-27 MED ORDER — MIDAZOLAM HCL 2 MG/2ML IJ SOLN
1.0000 mg | Freq: Once | INTRAMUSCULAR | Status: AC
  Administered 2021-08-27: 10:00:00 1 mg via INTRAVENOUS
  Filled 2021-08-27: qty 2

## 2021-08-27 MED ORDER — METHOCARBAMOL 500 MG IVPB - SIMPLE MED
500.0000 mg | Freq: Four times a day (QID) | INTRAVENOUS | Status: DC | PRN
  Filled 2021-08-27: qty 50

## 2021-08-27 MED ORDER — POLYETHYLENE GLYCOL 3350 17 G PO PACK: 17.0000 g | PACK | Freq: Every day | ORAL | Status: DC | PRN

## 2021-08-27 MED ORDER — BUPIVACAINE-EPINEPHRINE (PF) 0.25% -1:200000 IJ SOLN
INTRAMUSCULAR | Status: AC
  Filled 2021-08-27: qty 30

## 2021-08-27 MED ORDER — HYDROMORPHONE HCL 1 MG/ML IJ SOLN: 0.5000 mg | INTRAMUSCULAR | Status: DC | PRN

## 2021-08-27 MED ORDER — METHOCARBAMOL 500 MG PO TABS
500.0000 mg | ORAL_TABLET | Freq: Four times a day (QID) | ORAL | Status: DC | PRN
  Administered 2021-08-27 – 2021-08-28 (×2): 500 mg via ORAL
  Filled 2021-08-27 (×2): qty 1

## 2021-08-27 MED ORDER — CEFAZOLIN SODIUM-DEXTROSE 2-4 GM/100ML-% IV SOLN
2.0000 g | Freq: Four times a day (QID) | INTRAVENOUS | Status: AC
  Administered 2021-08-27 (×2): 2 g via INTRAVENOUS
  Filled 2021-08-27 (×2): qty 100

## 2021-08-27 MED ORDER — ORAL CARE MOUTH RINSE: 15.0000 mL | Freq: Once | OROMUCOSAL | Status: AC

## 2021-08-27 MED ORDER — PROPOFOL 10 MG/ML IV BOLUS
INTRAVENOUS | Status: AC
  Filled 2021-08-27: qty 20

## 2021-08-27 MED ORDER — LACTATED RINGERS IV BOLUS: 250.0000 mL | Freq: Once | INTRAVENOUS | Status: DC

## 2021-08-27 MED ORDER — HYDROMORPHONE HCL 1 MG/ML IJ SOLN: 0.2500 mg | INTRAMUSCULAR | Status: DC | PRN

## 2021-08-27 MED ORDER — LACTATED RINGERS IV BOLUS
500.0000 mL | Freq: Once | INTRAVENOUS | Status: AC
  Administered 2021-08-27: 13:00:00 500 mL via INTRAVENOUS

## 2021-08-27 MED ORDER — BUPIVACAINE-EPINEPHRINE 0.25% -1:200000 IJ SOLN
INTRAMUSCULAR | Status: DC | PRN
  Administered 2021-08-27: 30 mL

## 2021-08-27 MED ORDER — DIPHENHYDRAMINE HCL 12.5 MG/5ML PO ELIX: 12.5000 mg | ORAL_SOLUTION | ORAL | Status: DC | PRN

## 2021-08-27 MED ORDER — MENTHOL 3 MG MT LOZG: 1.0000 | LOZENGE | OROMUCOSAL | Status: DC | PRN

## 2021-08-27 MED ORDER — FENTANYL CITRATE PF 50 MCG/ML IJ SOSY
PREFILLED_SYRINGE | INTRAMUSCULAR | Status: AC
  Filled 2021-08-27: qty 3

## 2021-08-27 MED ORDER — BUPIVACAINE HCL (PF) 0.5 % IJ SOLN
INTRAMUSCULAR | Status: DC | PRN
  Administered 2021-08-27: 30 mL

## 2021-08-27 MED ORDER — KETOROLAC TROMETHAMINE 15 MG/ML IJ SOLN
15.0000 mg | Freq: Four times a day (QID) | INTRAMUSCULAR | Status: DC
  Administered 2021-08-27 – 2021-08-28 (×3): 15 mg via INTRAVENOUS
  Filled 2021-08-27 (×3): qty 1

## 2021-08-27 MED ORDER — DOCUSATE SODIUM 100 MG PO CAPS
100.0000 mg | ORAL_CAPSULE | Freq: Two times a day (BID) | ORAL | Status: DC
  Administered 2021-08-27 – 2021-08-28 (×2): 100 mg via ORAL
  Filled 2021-08-27 (×2): qty 1

## 2021-08-27 MED ORDER — DEXAMETHASONE SODIUM PHOSPHATE 10 MG/ML IJ SOLN
INTRAMUSCULAR | Status: DC | PRN
  Administered 2021-08-27: 8 mg via INTRAVENOUS

## 2021-08-27 MED ORDER — TRANEXAMIC ACID-NACL 1000-0.7 MG/100ML-% IV SOLN
1000.0000 mg | INTRAVENOUS | Status: AC
  Administered 2021-08-27: 12:00:00 1000 mg via INTRAVENOUS
  Filled 2021-08-27: qty 100

## 2021-08-27 MED ORDER — FENTANYL CITRATE (PF) 100 MCG/2ML IJ SOLN
INTRAMUSCULAR | Status: DC | PRN
  Administered 2021-08-27: 25 ug via INTRAVENOUS
  Administered 2021-08-27: 50 ug via INTRAVENOUS
  Administered 2021-08-27: 25 ug via INTRAVENOUS

## 2021-08-27 MED ORDER — PHENOL 1.4 % MT LIQD: 1.0000 | OROMUCOSAL | Status: DC | PRN

## 2021-08-27 MED ORDER — KETOROLAC TROMETHAMINE 30 MG/ML IJ SOLN
INTRAMUSCULAR | Status: AC
  Filled 2021-08-27: qty 1

## 2021-08-27 MED ORDER — SODIUM CHLORIDE 0.9 % IR SOLN
Status: DC | PRN
  Administered 2021-08-27: 1000 mL

## 2021-08-27 MED ORDER — SODIUM CHLORIDE 0.9 % IV SOLN: INTRAVENOUS | Status: DC

## 2021-08-27 MED ORDER — METOCLOPRAMIDE HCL 5 MG/ML IJ SOLN
5.0000 mg | Freq: Three times a day (TID) | INTRAMUSCULAR | Status: DC | PRN
  Administered 2021-08-27: 15:00:00 10 mg via INTRAVENOUS

## 2021-08-27 MED ORDER — OXYCODONE HCL 5 MG PO TABS: 5.0000 mg | ORAL_TABLET | ORAL | Status: DC | PRN

## 2021-08-27 MED ORDER — METOCLOPRAMIDE HCL 5 MG/ML IJ SOLN
INTRAMUSCULAR | Status: AC
  Filled 2021-08-27: qty 2

## 2021-08-27 MED ORDER — OXYCODONE HCL 5 MG PO TABS: 5.0000 mg | ORAL_TABLET | ORAL | 0 refills | Status: DC | PRN

## 2021-08-27 MED ORDER — ISOPROPYL ALCOHOL 70 % SOLN
Status: DC | PRN
  Administered 2021-08-27: 1 via TOPICAL

## 2021-08-27 MED ORDER — ONDANSETRON HCL 4 MG PO TABS
4.0000 mg | ORAL_TABLET | Freq: Four times a day (QID) | ORAL | Status: DC | PRN
  Filled 2021-08-27: qty 1

## 2021-08-27 MED ORDER — FENTANYL CITRATE (PF) 100 MCG/2ML IJ SOLN
INTRAMUSCULAR | Status: AC
  Filled 2021-08-27: qty 2

## 2021-08-27 MED ORDER — ONDANSETRON HCL 4 MG/2ML IJ SOLN
4.0000 mg | Freq: Once | INTRAMUSCULAR | Status: AC | PRN
  Administered 2021-08-27: 15:00:00 4 mg via INTRAVENOUS

## 2021-08-27 MED ORDER — ONDANSETRON HCL 4 MG/2ML IJ SOLN
INTRAMUSCULAR | Status: DC | PRN
  Administered 2021-08-27: 4 mg via INTRAVENOUS

## 2021-08-27 MED ORDER — FENTANYL CITRATE PF 50 MCG/ML IJ SOSY
50.0000 ug | PREFILLED_SYRINGE | Freq: Once | INTRAMUSCULAR | Status: AC
  Administered 2021-08-27: 10:00:00 50 ug via INTRAVENOUS
  Filled 2021-08-27: qty 2

## 2021-08-27 MED ORDER — POVIDONE-IODINE 10 % EX SWAB: 2.0000 "application " | Freq: Once | CUTANEOUS | Status: AC

## 2021-08-27 MED ORDER — ONDANSETRON HCL 4 MG/2ML IJ SOLN: 4.0000 mg | Freq: Four times a day (QID) | INTRAMUSCULAR | Status: DC | PRN

## 2021-08-27 MED ORDER — METOCLOPRAMIDE HCL 5 MG PO TABS
5.0000 mg | ORAL_TABLET | Freq: Three times a day (TID) | ORAL | Status: DC | PRN
  Filled 2021-08-27: qty 2

## 2021-08-27 MED ORDER — AMISULPRIDE (ANTIEMETIC) 5 MG/2ML IV SOLN
10.0000 mg | Freq: Once | INTRAVENOUS | Status: AC | PRN
  Administered 2021-08-27: 14:00:00 10 mg via INTRAVENOUS

## 2021-08-27 MED ORDER — SENNA 8.6 MG PO TABS: 2.0000 | ORAL_TABLET | Freq: Every day | ORAL | 1 refills | Status: AC

## 2021-08-27 MED ORDER — ASPIRIN 81 MG PO CHEW: 81.0000 mg | CHEWABLE_TABLET | Freq: Two times a day (BID) | ORAL | 0 refills | Status: AC

## 2021-08-27 MED ORDER — ASPIRIN 81 MG PO CHEW
81.0000 mg | CHEWABLE_TABLET | Freq: Two times a day (BID) | ORAL | Status: DC
  Administered 2021-08-27 – 2021-08-28 (×2): 81 mg via ORAL
  Filled 2021-08-27 (×2): qty 1

## 2021-08-27 MED ORDER — ONDANSETRON HCL 4 MG PO TABS: 4.0000 mg | ORAL_TABLET | Freq: Three times a day (TID) | ORAL | 0 refills | Status: DC | PRN

## 2021-08-27 MED ORDER — FENTANYL CITRATE PF 50 MCG/ML IJ SOSY: 25.0000 ug | PREFILLED_SYRINGE | INTRAMUSCULAR | Status: DC | PRN

## 2021-08-27 MED ORDER — HYDROMORPHONE HCL 1 MG/ML IJ SOLN
INTRAMUSCULAR | Status: DC | PRN
  Administered 2021-08-27: .5 mg via INTRAVENOUS

## 2021-08-27 MED ORDER — KETOROLAC TROMETHAMINE 30 MG/ML IJ SOLN
INTRAMUSCULAR | Status: DC | PRN
  Administered 2021-08-27: 30 mg via INTRA_ARTICULAR

## 2021-08-27 MED ORDER — DOCUSATE SODIUM 100 MG PO CAPS: 100.0000 mg | ORAL_CAPSULE | Freq: Two times a day (BID) | ORAL | 1 refills | Status: AC

## 2021-08-27 MED ORDER — ACETAMINOPHEN 325 MG PO TABS: 325.0000 mg | ORAL_TABLET | Freq: Four times a day (QID) | ORAL | Status: DC | PRN

## 2021-08-27 MED ORDER — POVIDONE-IODINE 10 % EX SWAB
2.0000 "application " | Freq: Once | CUTANEOUS | Status: AC
  Administered 2021-08-27: 2 via TOPICAL

## 2021-08-27 MED ORDER — CEFAZOLIN SODIUM-DEXTROSE 2-4 GM/100ML-% IV SOLN
2.0000 g | INTRAVENOUS | Status: AC
  Administered 2021-08-27: 11:00:00 2 g via INTRAVENOUS
  Filled 2021-08-27: qty 100

## 2021-08-27 MED ORDER — SODIUM CHLORIDE (PF) 0.9 % IJ SOLN
INTRAMUSCULAR | Status: DC | PRN
  Administered 2021-08-27: 30 mL

## 2021-08-27 MED ORDER — AMISULPRIDE (ANTIEMETIC) 5 MG/2ML IV SOLN
INTRAVENOUS | Status: AC
  Filled 2021-08-27: qty 4

## 2021-08-27 MED ORDER — HYDROMORPHONE HCL 1 MG/ML IJ SOLN
INTRAMUSCULAR | Status: AC
  Administered 2021-08-27: 14:00:00 0.5 mg via INTRAVENOUS
  Filled 2021-08-27: qty 2

## 2021-08-27 MED ORDER — LACTATED RINGERS IV SOLN: INTRAVENOUS | Status: DC

## 2021-08-27 MED ORDER — ACETAMINOPHEN 500 MG PO TABS: 1000.0000 mg | ORAL_TABLET | Freq: Once | ORAL | Status: DC

## 2021-08-27 MED ORDER — METHOCARBAMOL 500 MG IVPB - SIMPLE MED
INTRAVENOUS | Status: AC
  Administered 2021-08-27: 14:00:00 500 mg via INTRAVENOUS
  Filled 2021-08-27: qty 50

## 2021-08-27 MED ORDER — PROPOFOL 500 MG/50ML IV EMUL
INTRAVENOUS | Status: DC | PRN
  Administered 2021-08-27: 75 ug/kg/min via INTRAVENOUS

## 2021-08-27 MED ORDER — STERILE WATER FOR IRRIGATION IR SOLN
Status: DC | PRN
  Administered 2021-08-27: 2000 mL

## 2021-08-27 MED ORDER — INSULIN ASPART 100 UNIT/ML IJ SOLN: 0.0000 [IU] | Freq: Every day | INTRAMUSCULAR | Status: DC

## 2021-08-27 MED ORDER — BUPIVACAINE IN DEXTROSE 0.75-8.25 % IT SOLN
INTRATHECAL | Status: DC | PRN
  Administered 2021-08-27: 1.6 mL via INTRATHECAL

## 2021-08-27 SURGICAL SUPPLY — 74 items
ADH SKN CLS APL DERMABOND .7 (GAUZE/BANDAGES/DRESSINGS) ×1
APL PRP STRL LF DISP 70% ISPRP (MISCELLANEOUS) ×2
BAG COUNTER SPONGE SURGICOUNT (BAG) IMPLANT
BAG SPEC THK2 15X12 ZIP CLS (MISCELLANEOUS)
BAG SPNG CNTER NS LX DISP (BAG)
BAG SURGICOUNT SPONGE COUNTING (BAG)
BAG ZIPLOCK 12X15 (MISCELLANEOUS) IMPLANT
BATTERY INSTRU NAVIGATION (MISCELLANEOUS) ×9 IMPLANT
BLADE SAW RECIPROCATING 77.5 (BLADE) ×3 IMPLANT
BNDG CMPR MED 10X6 ELC LF (GAUZE/BANDAGES/DRESSINGS) ×1
BNDG ELASTIC 4X5.8 VLCR STR LF (GAUZE/BANDAGES/DRESSINGS) ×3 IMPLANT
BNDG ELASTIC 6X10 VLCR STRL LF (GAUZE/BANDAGES/DRESSINGS) ×2 IMPLANT
BNDG ELASTIC 6X5.8 VLCR STR LF (GAUZE/BANDAGES/DRESSINGS) ×3 IMPLANT
BSPLAT TIB 3 KN TRITANIUM (Knees) ×1 IMPLANT
BTRY SRG DRVR LF (MISCELLANEOUS) ×3
CHLORAPREP W/TINT 26 (MISCELLANEOUS) ×6 IMPLANT
COMPONENT TRI CR RETAIN KNEE (Orthopedic Implant) IMPLANT
COVER SURGICAL LIGHT HANDLE (MISCELLANEOUS) ×3 IMPLANT
DECANTER SPIKE VIAL GLASS SM (MISCELLANEOUS) ×6 IMPLANT
DERMABOND ADVANCED (GAUZE/BANDAGES/DRESSINGS) ×2
DERMABOND ADVANCED .7 DNX12 (GAUZE/BANDAGES/DRESSINGS) ×2 IMPLANT
DRAPE INCISE IOBAN 66X45 STRL (DRAPES) ×9 IMPLANT
DRAPE SHEET LG 3/4 BI-LAMINATE (DRAPES) ×9 IMPLANT
DRAPE U-SHAPE 47X51 STRL (DRAPES) ×3 IMPLANT
DRSG AQUACEL AG ADV 3.5X10 (GAUZE/BANDAGES/DRESSINGS) ×2 IMPLANT
DRSG AQUACEL AG ADV 3.5X14 (GAUZE/BANDAGES/DRESSINGS) ×3 IMPLANT
ELECT BLADE TIP CTD 4 INCH (ELECTRODE) ×3 IMPLANT
ELECT REM PT RETURN 15FT ADLT (MISCELLANEOUS) ×3 IMPLANT
GAUZE SPONGE 4X4 12PLY STRL (GAUZE/BANDAGES/DRESSINGS) ×3 IMPLANT
GLOVE SRG 8 PF TXTR STRL LF DI (GLOVE) ×2 IMPLANT
GLOVE SURG ENC MOIS LTX SZ8.5 (GLOVE) ×6 IMPLANT
GLOVE SURG ENC TEXT LTX SZ7.5 (GLOVE) ×9 IMPLANT
GLOVE SURG UNDER POLY LF SZ8 (GLOVE) ×6
GLOVE SURG UNDER POLY LF SZ8.5 (GLOVE) ×3 IMPLANT
GOWN SPEC L3 XXLG W/TWL (GOWN DISPOSABLE) ×3 IMPLANT
GOWN STRL REUS W/TWL XL LVL3 (GOWN DISPOSABLE) ×3 IMPLANT
HANDPIECE INTERPULSE COAX TIP (DISPOSABLE) ×3
HOLDER FOLEY CATH W/STRAP (MISCELLANEOUS) ×3 IMPLANT
HOOD PEEL AWAY FLYTE STAYCOOL (MISCELLANEOUS) ×9 IMPLANT
INSERT TIB CS TRIATH X3 9 (Insert) ×2 IMPLANT
JET LAVAGE IRRISEPT WOUND (IRRIGATION / IRRIGATOR)
KIT TURNOVER KIT A (KITS) IMPLANT
KNEE PATELLA ASYMMETRIC 10X32 (Knees) ×2 IMPLANT
KNEE TIBIAL COMPONENT SZ3 (Knees) ×2 IMPLANT
LAVAGE JET IRRISEPT WOUND (IRRIGATION / IRRIGATOR) IMPLANT
MARKER SKIN DUAL TIP RULER LAB (MISCELLANEOUS) ×3 IMPLANT
NDL SAFETY ECLIPSE 18X1.5 (NEEDLE) ×1 IMPLANT
NDL SPNL 18GX3.5 QUINCKE PK (NEEDLE) ×1 IMPLANT
NEEDLE HYPO 18GX1.5 SHARP (NEEDLE) ×3
NEEDLE SPNL 18GX3.5 QUINCKE PK (NEEDLE) ×3 IMPLANT
NS IRRIG 1000ML POUR BTL (IV SOLUTION) ×3 IMPLANT
PACK TOTAL KNEE CUSTOM (KITS) ×3 IMPLANT
PADDING CAST COTTON 6X4 STRL (CAST SUPPLIES) ×3 IMPLANT
PIN FLUTED HEDLESS FIX 3.5X1/8 (PIN) ×4 IMPLANT
PROTECTOR NERVE ULNAR (MISCELLANEOUS) ×3 IMPLANT
SAW OSC TIP CART 19.5X105X1.3 (SAW) ×3 IMPLANT
SEALER BIPOLAR AQUA 6.0 (INSTRUMENTS) ×3 IMPLANT
SET HNDPC FAN SPRY TIP SCT (DISPOSABLE) ×1 IMPLANT
SET PAD KNEE POSITIONER (MISCELLANEOUS) ×3 IMPLANT
SUT MNCRL AB 3-0 PS2 18 (SUTURE) ×3 IMPLANT
SUT MNCRL AB 4-0 PS2 18 (SUTURE) ×3 IMPLANT
SUT MON AB 2-0 CT1 36 (SUTURE) ×3 IMPLANT
SUT STRATAFIX PDO 1 14 VIOLET (SUTURE) ×3
SUT STRATFX PDO 1 14 VIOLET (SUTURE) ×1
SUT VIC AB 1 CTX 36 (SUTURE) ×6
SUT VIC AB 1 CTX36XBRD ANBCTR (SUTURE) ×2 IMPLANT
SUT VIC AB 2-0 CT1 27 (SUTURE) ×3
SUT VIC AB 2-0 CT1 TAPERPNT 27 (SUTURE) ×1 IMPLANT
SUTURE STRATFX PDO 1 14 VIOLET (SUTURE) ×1 IMPLANT
TRAY FOLEY MTR SLVR 16FR STAT (SET/KITS/TRAYS/PACK) IMPLANT
TRIA CRUCIATE RETAIN KNEE (Orthopedic Implant) ×3 IMPLANT
TUBE SUCTION HIGH CAP CLEAR NV (SUCTIONS) ×3 IMPLANT
WATER STERILE IRR 1000ML POUR (IV SOLUTION) ×6 IMPLANT
WRAP KNEE MAXI GEL POST OP (GAUZE/BANDAGES/DRESSINGS) ×2 IMPLANT

## 2021-08-27 NOTE — Progress Notes (Signed)
PT Cancellation Note  Patient Details Name: [name] [name] MRN: [id number] DOB: 03/12/1956   Cancelled Treatment:    Reason Eval/Treat Not Completed: Medical issues which prohibited therapy (n/v)   Kenyon Ana 08/27/2021, 4:07 PM

## 2021-08-27 NOTE — Anesthesia Procedure Notes (Signed)
Spinal  Patient location during procedure: OR Start time: 08/27/2021 11:18 AM End time: 08/27/2021 11:23 AM Reason for block: surgical anesthesia Staffing Performed: resident/CRNA  Anesthesiologist: Merlinda Frederick, MD Resident/CRNA: Gwyndolyn Saxon, CRNA Preanesthetic Checklist Completed: patient identified, IV checked, site marked, risks and benefits discussed, surgical consent, monitors and equipment checked, pre-op evaluation and timeout performed Spinal Block Patient position: sitting Prep: DuraPrep Patient monitoring: heart rate, continuous pulse ox and blood pressure Approach: midline Location: L3-4 Injection technique: single-shot Needle Needle type: Pencan  Needle gauge: 24 G Needle length: 10 cm Assessment Sensory level: T6 Events: CSF return Additional Notes Pt with slight left curvature to lumbar spine; good CSF return on first attemp

## 2021-08-27 NOTE — Anesthesia Preprocedure Evaluation (Addendum)
Anesthesia Evaluation  Patient identified by MRN, date of birth, ID band Patient awake    Reviewed: Allergy & Precautions, NPO status , Patient's Chart, lab work & pertinent test results  Airway Mallampati: I  TM Distance: >3 FB Neck ROM: Full    Dental   Numerous fillings:   Pulmonary neg pulmonary ROS,    Pulmonary exam normal breath sounds clear to auscultation       Cardiovascular Exercise Tolerance: Good negative cardio ROS Normal cardiovascular exam Rhythm:Regular Rate:Normal  08/25/21 EKG: NSR   Neuro/Psych negative neurological ROS  negative psych ROS   GI/Hepatic negative GI ROS, Neg liver ROS,   Endo/Other  negative endocrine ROSdiabetes, Type 2, Oral Hypoglycemic Agents  Renal/GU negative Renal ROS  negative genitourinary   Musculoskeletal  (+) Arthritis ,   Abdominal   Peds negative pediatric ROS (+)  Hematology negative hematology ROS (+)   Anesthesia Other Findings   Reproductive/Obstetrics negative OB ROS                           Anesthesia Physical Anesthesia Plan  ASA: 2  Anesthesia Plan: MAC, Regional and Spinal   Post-op Pain Management: Tylenol PO (pre-op)   Induction:   PONV Risk Score and Plan: 2 and Treatment may vary due to age or medical condition, Propofol infusion, TIVA, Midazolam, Ondansetron and Dexamethasone  Airway Management Planned: Natural Airway and Simple Face Mask  Additional Equipment: None  Intra-op Plan:   Post-operative Plan:   Informed Consent: I have reviewed the patients History and Physical, chart, labs and discussed the procedure including the risks, benefits and alternatives for the proposed anesthesia with the patient or authorized representative who has indicated his/her understanding and acceptance.     Dental advisory given  Plan Discussed with: CRNA, Anesthesiologist and Surgeon  Anesthesia Plan Comments: (Adductor  canal block for postop pain control. Spinal. Propofol gtt. GA/LMA as backup plan. Norton Blizzard, MD  )        Anesthesia Quick Evaluation

## 2021-08-27 NOTE — Anesthesia Procedure Notes (Signed)
Anesthesia Regional Block: Adductor canal block   Pre-Anesthetic Checklist: , timeout performed,  Correct Patient, Correct Site, Correct Laterality,  Correct Procedure, Correct Position, site marked,  Risks and benefits discussed,  Surgical consent,  Pre-op evaluation,  At surgeon's request and post-op pain management  Laterality: Right  Prep: chloraprep       Needles:  Injection technique: Single-shot  Needle Type: Echogenic Stimulator Needle     Needle Length: 10cm  Needle Gauge: 20     Additional Needles:   Procedures:,,,, ultrasound used (permanent image in chart),,    Narrative:  Start time: 08/27/2021 10:10 AM End time: 08/27/2021 10:15 AM Injection made incrementally with aspirations every 5 mL.  Performed by: Personally  Anesthesiologist: Merlinda Frederick, MD  Additional Notes: Functioning IV was confirmed and monitors were applied.  Sterile prep and drape,hand hygiene and sterile gloves were used. Ultrasound guidance: relevant anatomy identified, needle position confirmed, local anesthetic spread visualized around nerve(s)., vascular puncture avoided. Negative aspiration and negative test dose prior to incremental administration of local anesthetic. The patient tolerated the procedure well.

## 2021-08-27 NOTE — Anesthesia Procedure Notes (Signed)
Procedure Name: LMA Insertion Date/Time: 08/27/2021 11:52 AM Performed by: Gwyndolyn Saxon, CRNA Pre-anesthesia Checklist: Patient identified, Emergency Drugs available, Suction available, Patient being monitored and Timeout performed Patient Re-evaluated:Patient Re-evaluated prior to induction Oxygen Delivery Method: Circle system utilized Preoxygenation: Pre-oxygenation with 100% oxygen Induction Type: IV induction Ventilation: Mask ventilation without difficulty LMA: LMA with gastric port inserted LMA Size: 4.0 Number of attempts: 1 Placement Confirmation: positive ETCO2 and breath sounds checked- equal and bilateral Tube secured with: Tape Dental Injury: Teeth and Oropharynx as per pre-operative assessment

## 2021-08-27 NOTE — Op Note (Signed)
OPERATIVE REPORT  SURGEON: Rod Can, MD   ASSISTANT: Cherlynn June, PA-C  PREOPERATIVE DIAGNOSIS: Primary Right knee arthritis.   POSTOPERATIVE DIAGNOSIS: Primary Right knee arthritis.   PROCEDURE: Right total knee arthroplasty.   IMPLANTS: Stryker Triathlon CR femur, size 3. Stryker Tritanium tibia, size 3. X3 polyethelyene insert, size 9 mm, CS. 3 button asymmetric patella, size 32 mm.  ANESTHESIA:  MAC, General, Regional, and Spinal  TOURNIQUET TIME: Not utilized.   ESTIMATED BLOOD LOSS:-300 mL    ANTIBIOTICS: 2g Ancef.  DRAINS: None.  COMPLICATIONS: None   CONDITION: PACU - hemodynamically stable.   BRIEF CLINICAL NOTE: [name] [name] is a 65 y.o. female with a long-standing history of Right knee arthritis. After failing conservative management, the patient was indicated for total knee arthroplasty. The risks, benefits, and alternatives to the procedure were explained, and the patient elected to proceed.  PROCEDURE IN DETAIL: Adductor canal block was obtained in the pre-op holding area. Once inside the operative room, spinal anesthesia was obtained, and a foley catheter was inserted. The patient was then positioned and the lower extremity was prepped and draped in the normal sterile surgical fashion.  A time-out was called verifying side and site of surgery. The patient received IV antibiotics within 60 minutes of beginning the procedure. A tourniquet was not utilized. Spinal was not effective, and GETA was induced.   An anterior approach to the knee was performed utilizing a midvastus arthrotomy. A medial release was performed and the patellar fat pad was excised. Stryker imageless navigation was used to cut the distal femur perpendicular to the mechanical axis. A freehand patellar resection was performed, and the patella was sized and prepared with 3 lug  holes.  Nagivation was used to make a neutral proximal tibia resection, taking 3 mm of bone from the less affected lateral side with 3 degrees of slope. The menisci were excised. A spacer block was placed, and the alignment and balance in extension were confirmed.   The distal femur was sized using the 3-degree external rotation guide referencing the posterior femoral cortex. The appropriate 4-in-1 cutting block was pinned into place. Rotation was checked using Whiteside's line, the epicondylar axis, and then confirmed with a spacer block in flexion. The remaining femoral cuts were performed, taking care to protect the MCL.  The tibia was sized and the trial tray was pinned into place. The remaining trail components were inserted. The knee was stable to varus and valgus stress through a full range of motion. The patella tracked centrally, and the PCL was well balanced. The trial components were removed, and the proximal tibial surface was prepared. Final components were impacted into place. The knee was tested for a final time and found to be well balanced.   The wound was copiously irrigated with Irrisept solution and normal saline using pule lavage.  Marcaine solution was injected into the periarticular soft tissue.  The wound was closed in layers using #1 Vicryl and Stratafix for the fascia, 2-0 Vicryl for the subcutaneous fat, 2-0 Monocryl for the deep dermal layer, 3-0 running Monocryl subcuticular Stitch, and 4-0 Monocryl stay sutures at both ends of the wound. Dermabond was applied to the skin.  Once the glue was fully dried, an Aquacell Ag and compressive dressing were applied.  Tthe patient was transported to the recovery room in stable condition.  Sponge, needle, and instrument counts were correct at the end of the case x2.  The patient tolerated the procedure well and there were no known  complications.  Please note that a surgical assistant was a medical necessity for this procedure in order to  perform it in a safe and expeditious manner. Surgical assistant was necessary to retract the ligaments and vital neurovascular structures to prevent injury to them and also necessary for proper positioning of the limb to allow for anatomic placement of the prosthesis.

## 2021-08-27 NOTE — Progress Notes (Signed)
Assisted Dr. Norton Blizzard with Right Knee Adductor Canal block. Side rails up, monitors on throughout procedure. See vital signs in flow sheet. Tolerated Procedure well.

## 2021-08-27 NOTE — Transfer of Care (Signed)
Immediate Anesthesia Transfer of Care Note  Patient: [name] [name]  Procedure(s) Performed: COMPUTER ASSISTED TOTAL KNEE ARTHROPLASTY (Right: Knee)  Patient Location: PACU  Anesthesia Type:General and Regional  Level of Consciousness: drowsy and patient cooperative  Airway & Oxygen Therapy: Patient Spontanous Breathing and Patient connected to face mask oxygen  Post-op Assessment: Report given to RN and Post -op Vital signs reviewed and stable  Post vital signs: Reviewed and stable  Last Vitals:  Vitals Value Taken Time  BP 140/75 08/27/21 1320  Temp    Pulse 61 08/27/21 1324  Resp 11 08/27/21 1324  SpO2 100 % 08/27/21 1324  Vitals shown include unvalidated device data.  Last Pain:  Vitals:   08/27/21 0918  TempSrc:   PainSc: 0-No pain         Complications: No notable events documented.

## 2021-08-27 NOTE — Discharge Instructions (Signed)
 Dr. Annastyn Silvey Total Joint Specialist [facility] [address] [place], [place] [address] [phone]  TOTAL KNEE REPLACEMENT POSTOPERATIVE DIRECTIONS    Knee Rehabilitation, Guidelines Following Surgery  Results after knee surgery are often greatly improved when you follow the exercise, range of motion and muscle strengthening exercises prescribed by your doctor. Safety measures are also important to protect the knee from further injury. Any time any of these exercises cause you to have increased pain or swelling in your knee joint, decrease the amount until you are comfortable again and slowly increase them. If you have problems or questions, call your caregiver or physical therapist for advice.   WEIGHT BEARING Weight bearing as tolerated with assist device (walker, cane, etc) as directed, use it as long as suggested by your surgeon or therapist, typically at least 4-6 weeks.  HOME CARE INSTRUCTIONS  Remove items at home which could result in a fall. This includes throw rugs or furniture in walking pathways.  Continue medications as instructed at time of discharge. You may have some home medications which will be placed on hold until you complete the course of blood thinner medication.  You may start showering once you are discharged home but do not submerge the incision under water. Just pat the incision dry and apply a dry gauze dressing on daily. Walk with walker as instructed.  You may resume a sexual relationship in one month or when given the OK by your doctor.  Use walker as long as suggested by your caregivers. Avoid periods of inactivity such as sitting longer than an hour when not asleep. This helps prevent blood clots.  You may put full weight on your legs and walk as much as is comfortable.  You may return to work once you are cleared by your doctor.  Do not drive a car for 6 weeks or until released by you surgeon.  Do not drive while  taking narcotics.  Wear the elastic stockings for three weeks following surgery during the day but you may remove then at night. Make sure you keep all of your appointments after your operation with all of your doctors and caregivers. You should call the office at the above phone number and make an appointment for approximately two weeks after the date of your surgery. Do not remove your surgical dressing. The dressing is waterproof; you may take showers in 3 days, but do not take tub baths or submerge the dressing. Please pick up a stool softener and laxative for home use as long as you are requiring pain medications. ICE to the affected knee every three hours for 30 minutes at a time and then as needed for pain and swelling.  Continue to use ice on the knee for pain and swelling from surgery. You may notice swelling that will progress down to the foot and ankle.  This is normal after surgery.  Elevate the leg when you are not up walking on it.   It is important for you to complete the blood thinner medication as prescribed by your doctor. Continue to use the breathing machine which will help keep your temperature down.  It is common for your temperature to cycle up and down following surgery, especially at night when you are not up moving around and exerting yourself.  The breathing machine keeps your lungs expanded and your temperature down.  RANGE OF MOTION AND STRENGTHENING EXERCISES  Rehabilitation of the knee is important following a knee injury or an   operation. After just a few days of immobilization, the muscles of the thigh which control the knee become weakened and shrink (atrophy). Knee exercises are designed to build up the tone and strength of the thigh muscles and to improve knee motion. Often times heat used for twenty to thirty minutes before working out will loosen up your tissues and help with improving the range of motion but do not use heat for the first two weeks following surgery.  These exercises can be done on a training (exercise) mat, on the floor, on a table or on a bed. Use what ever works the best and is most comfortable for you Knee exercises include:  Leg Lifts - While your knee is still immobilized in a splint or cast, you can do straight leg raises. Lift the leg to 60 degrees, hold for 3 sec, and slowly lower the leg. Repeat 10-20 times 2-3 times daily. Perform this exercise against resistance later as your knee gets better.  Quad and Hamstring Sets - Tighten up the muscle on the front of the thigh (Quad) and hold for 5-10 sec. Repeat this 10-20 times hourly. Hamstring sets are done by pushing the foot backward against an object and holding for 5-10 sec. Repeat as with quad sets.  A rehabilitation program following serious knee injuries can speed recovery and prevent re-injury in the future due to weakened muscles. Contact your doctor or a physical therapist for more information on knee rehabilitation.   POST-OPERATIVE OPIOID TAPER INSTRUCTIONS: It is important to wean off of your opioid medication as soon as possible. If you do not need pain medication after your surgery it is ok to stop day one. Opioids include: Codeine, Hydrocodone(Norco, Vicodin), Oxycodone(Percocet, oxycontin) and hydromorphone amongst others.  Long term and even short term use of opiods can cause: Increased pain response Dependence Constipation Depression Respiratory depression And more.  Withdrawal symptoms can include Flu like symptoms Nausea, vomiting And more Techniques to manage these symptoms Hydrate well Eat regular healthy meals Stay active Use relaxation techniques(deep breathing, meditating, yoga) Do Not substitute Alcohol to help with tapering If you have been on opioids for less than two weeks and do not have pain than it is ok to stop all together.  Plan to wean off of opioids This plan should start within one week post op of your joint replacement. Maintain the same  interval or time between taking each dose and first decrease the dose.  Cut the total daily intake of opioids by one tablet each day Next start to increase the time between doses. The last dose that should be eliminated is the evening dose.    SKILLED REHAB INSTRUCTIONS: If the patient is transferred to a skilled rehab facility following release from the hospital, a list of the current medications will be sent to the facility for the patient to continue.  When discharged from the skilled rehab facility, please have the facility set up the patient's Home Health Physical Therapy prior to being released. Also, the skilled facility will be responsible for providing the patient with their medications at time of release from the facility to include their pain medication, the muscle relaxants, and their blood thinner medication. If the patient is still at the rehab facility at time of the two week follow up appointment, the skilled rehab facility will also need to assist the patient in arranging follow up appointment in our office and any transportation needs.  MAKE SURE YOU:  Understand these instructions.  Will watch   your condition.  Will get help right away if you are not doing well or get worse.    Pick up stool softner and laxative for home use following surgery while on pain medications. Do NOT remove your dressing. You may shower.  Do not take tub baths or submerge incision under water. May shower starting three days after surgery. Please use a clean towel to pat the incision dry following showers. Continue to use ice for pain and swelling after surgery. Do not use any lotions or creams on the incision until instructed by your surgeon.  

## 2021-08-27 NOTE — Interval H&P Note (Signed)
History and Physical Interval Note:  08/27/2021 10:10 AM  [name] [name]  has presented today for surgery, with the diagnosis of Right knee osteoarthritis.  The various methods of treatment have been discussed with the patient and family. After consideration of risks, benefits and other options for treatment, the patient has consented to  Procedure(s): COMPUTER ASSISTED TOTAL KNEE ARTHROPLASTY (Right) as a surgical intervention.  The patient's history has been reviewed, patient examined, no change in status, stable for surgery.  I have reviewed the patient's chart and labs.  Questions were answered to the patient's satisfaction.     Hilton Cork Mitsuko Luera

## 2021-08-28 ENCOUNTER — Encounter (HOSPITAL_COMMUNITY): Payer: Self-pay | Admitting: Orthopedic Surgery

## 2021-08-28 DIAGNOSIS — M1711 Unilateral primary osteoarthritis, right knee: Secondary | ICD-10-CM | POA: Diagnosis not present

## 2021-08-28 LAB — BASIC METABOLIC PANEL
Anion gap: 6 (ref 5–15)
BUN: 15 mg/dL (ref 8–23)
CO2: 25 mmol/L (ref 22–32)
Calcium: 8.4 mg/dL — ABNORMAL LOW (ref 8.9–10.3)
Chloride: 106 mmol/L (ref 98–111)
Creatinine, Ser: 0.53 mg/dL (ref 0.44–1.00)
GFR, Estimated: >60 mL/min (ref >60)
Glucose, Bld: 195 mg/dL — ABNORMAL HIGH (ref 70–99)
Potassium: 4.2 mmol/L (ref 3.5–5.1)
Sodium: 137 mmol/L (ref 135–145)

## 2021-08-28 LAB — CBC
HCT: 32.9 % — ABNORMAL LOW (ref 36.0–46.0)
Hemoglobin: 10.8 g/dL — ABNORMAL LOW (ref 12.0–15.0)
MCH: 28.4 pg (ref 26.0–34.0)
MCHC: 32.8 g/dL (ref 30.0–36.0)
MCV: 86.6 fL (ref 80.0–100.0)
Platelets: 246 10*3/uL (ref 150–400)
RBC: 3.8 MIL/uL — ABNORMAL LOW (ref 3.87–5.11)
RDW: 13.3 % (ref 11.5–15.5)
WBC: 12.9 10*3/uL — ABNORMAL HIGH (ref 4.0–10.5)
nRBC: 0 % (ref 0.0–0.2)

## 2021-08-28 LAB — GLUCOSE, CAPILLARY: Glucose-Capillary: 125 mg/dL — ABNORMAL HIGH (ref 70–99)

## 2021-08-28 NOTE — Progress Notes (Signed)
° ° °  Subjective:  Patient reports pain as mild to moderate.  Denies CP/SOB. Admitted for N/V - no emesis since early yesterday evening. Tolerating POs  Objective:   VITALS:   Vitals:   08/27/21 1859 08/27/21 2125 08/28/21 0129 08/28/21 0535  BP: 118/63 (!) 115/55 (!) 109/52 (!) 102/56  Pulse: 63 60 65 66  Resp: 17 16 17 18   Temp: 98.6 F (37 C) 97.6 F (36.4 C) 98.7 F (37.1 C) 99.1 F (37.3 C)  TempSrc:  Oral Oral Oral  SpO2: 97% 97% 96% 96%  Weight:      Height:        NAD Neurovascular intact Sensation intact distally Intact pulses distally Dorsiflexion/Plantar flexion intact Incision: dressing C/D/I Compartment soft   Lab Results  Component Value Date   WBC 12.9 (H) 08/28/2021   HGB 10.8 (L) 08/28/2021   HCT 32.9 (L) 08/28/2021   MCV 86.6 08/28/2021   PLT 246 08/28/2021   BMET    Component Value Date/Time   NA 137 08/28/2021 0335   K 4.2 08/28/2021 0335   CL 106 08/28/2021 0335   CO2 25 08/28/2021 0335   GLUCOSE 195 (H) 08/28/2021 0335   BUN 15 08/28/2021 0335   CREATININE 0.53 08/28/2021 0335   CREATININE 0.57 06/28/2020 1042   CALCIUM 8.4 (L) 08/28/2021 0335   GFRNONAA >60 08/28/2021 0335   GFRNONAA 99 06/28/2020 1042     Assessment/Plan: 1 Day Post-Op   Principal Problem:   Osteoarthritis of right knee Active Problems:   S/P total knee arthroplasty, right   WBAT with walker DVT ppx: Aspirin, SCDs, TEDS PO pain control PT/OT Dispo: D/C home after clears PT   Bertram Savin 08/28/2021, 8:19 AM   Rod Can, MD [phone] [facility]   [place] [address], [place], [place] [address] Phone: [phone] [email or web address] Facebook   [employer]

## 2021-08-28 NOTE — Progress Notes (Signed)
Physical Therapy Treatment Patient Details Name: [name] [name] MRN: [id number] DOB: June 09, 1956 Today's Date: 08/28/2021   History of Present Illness 65 yo female s/p R TKA on 08/27/21. PMH: covid, DM    PT Comments    Pt continues very motivated and up to ambulate in hall, negotiate stairs and reviewed written HEP.  Pt eager for dc home.   Recommendations for follow up therapy are one component of a multi-disciplinary discharge planning process, led by the attending physician.  Recommendations may be updated based on patient status, additional functional criteria and insurance authorization.  Follow Up Recommendations  Follow physician's recommendations for discharge plan and follow up therapies     Assistance Recommended at Discharge Intermittent Supervision/Assistance  Equipment Recommendations  None recommended by PT    Recommendations for Other Services       Precautions / Restrictions Precautions Precautions: Knee;Fall Restrictions Weight Bearing Restrictions: No RLE Weight Bearing: Weight bearing as tolerated     Mobility  Bed Mobility Overal bed mobility: Needs Assistance Bed Mobility: Supine to Sit     Supine to sit: Min assist     General bed mobility comments: Pt up in chair and requests back to same    Transfers Overall transfer level: Needs assistance Equipment used: Rolling walker (2 wheels) Transfers: Sit to/from Stand Sit to Stand: Min guard;Supervision           General transfer comment: cues for LE management and use of UEs to self assist    Ambulation/Gait Ambulation/Gait assistance: Min guard;Supervision Gait Distance (Feet): 100 Feet Assistive device: Rolling walker (2 wheels) Gait Pattern/deviations: Decreased step length - right;Decreased step length - left;Shuffle;Trunk flexed;Step-to pattern;Step-through pattern Gait velocity: decr     General Gait Details: cues for posture, position from RW and initial  sequence   Stairs Stairs: Yes Stairs assistance: Min guard Stair Management: One rail Right;Step to pattern;Sideways Number of Stairs: 6 General stair comments: cues for rail, sideways position and sequence.   Wheelchair Mobility    Modified Rankin (Stroke Patients Only)       Balance Overall balance assessment: Needs assistance Sitting-balance support: No upper extremity supported;Feet supported Sitting balance-Leahy Scale: Good     Standing balance support: No upper extremity supported Standing balance-Leahy Scale: Fair                              Cognition Arousal/Alertness: Awake/alert Behavior During Therapy: WFL for tasks assessed/performed Overall Cognitive Status: Within Functional Limits for tasks assessed                                          Exercises Total Joint Exercises Ankle Circles/Pumps: AROM;Both;15 reps;Supine Quad Sets: AROM;Both;10 reps;Supine Heel Slides: AAROM;Right;15 reps;Supine Hip ABduction/ADduction: AAROM;Right;10 reps;Supine Straight Leg Raises: AAROM;AROM;Right;15 reps;Supine Long Arc Quad: AAROM;AROM;Right;10 reps;Seated    General Comments        Pertinent Vitals/Pain Pain Assessment: 0-10 Pain Score: 2  Pain Location: R knee/thigh Pain Descriptors / Indicators: Aching;Sore Pain Intervention(s): Limited activity within patient's tolerance;Monitored during session;Ice applied    Home Living Family/patient expects to be discharged to:: Private residence Living Arrangements: Spouse/significant other Available Help at Discharge: Family;Available 24 hours/day Type of Home: House Home Access: Stairs to enter Entrance Stairs-Rails: Psychiatric nurse of Steps: 7   Home Layout: One level Home Equipment: Conservation officer, nature (2 wheels)  Prior Function            PT Goals (current goals can now be found in the care plan section) Acute Rehab PT Goals Patient Stated Goal:  Regain IND PT Goal Formulation: With patient Time For Goal Achievement: 09/04/21 Potential to Achieve Goals: Good Progress towards PT goals: Progressing toward goals    Frequency    7X/week      PT Plan Current plan remains appropriate    Co-evaluation              AM-PAC PT "6 Clicks" Mobility   Outcome Measure  Help needed turning from your back to your side while in a flat bed without using bedrails?: None Help needed moving from lying on your back to sitting on the side of a flat bed without using bedrails?: A Little Help needed moving to and from a bed to a chair (including a wheelchair)?: A Little Help needed standing up from a chair using your arms (e.g., wheelchair or bedside chair)?: A Little Help needed to walk in hospital room?: A Little Help needed climbing 3-5 steps with a railing? : A Little 6 Click Score: 19    End of Session Equipment Utilized During Treatment: Gait belt Activity Tolerance: Patient tolerated treatment well Patient left: in chair;with call bell/phone within reach;with family/visitor present Nurse Communication: Mobility status PT Visit Diagnosis: Difficulty in walking, not elsewhere classified (R26.2)     Time: 3500-9381 PT Time Calculation (min) (ACUTE ONLY): 20 min  Charges:  $Gait Training: 8-22 mins $Therapeutic Exercise: 8-22 mins                     Debe Coder PT Acute Rehabilitation Services Pager [phone] Office [phone]    [name] [name] 08/28/2021, 10:56 AM

## 2021-08-28 NOTE — Anesthesia Postprocedure Evaluation (Signed)
Anesthesia Post Note  Patient: [name] [name]  Procedure(s) Performed: COMPUTER ASSISTED TOTAL KNEE ARTHROPLASTY (Right: Knee)     Patient location during evaluation: PACU Anesthesia Type: Regional and General Level of consciousness: awake and alert Pain management: pain level controlled Vital Signs Assessment: post-procedure vital signs reviewed and stable Respiratory status: spontaneous breathing, nonlabored ventilation and respiratory function stable Cardiovascular status: blood pressure returned to baseline and stable Postop Assessment: no apparent nausea or vomiting Anesthetic complications: no   No notable events documented.  Last Vitals:  Vitals:   08/28/21 0129 08/28/21 0535  BP: (!) 109/52 (!) 102/56  Pulse: 65 66  Resp: 17 18  Temp: 37.1 C 37.3 C  SpO2: 96% 96%    Last Pain:  Vitals:   08/28/21 0535  TempSrc: Oral  PainSc: 1                  Candra R Briele Lagasse

## 2021-08-28 NOTE — Evaluation (Signed)
Physical Therapy Evaluation Patient Details Name: [name] [name] MRN: [id number] DOB: 05-25-1956 Today's Date: 08/28/2021  History of Present Illness  65 yo female s/p R TKA on 08/27/21. PMH: covid, DM  Clinical Impression  Pt s/p R TKR and presents with decreased R LE strength/ROM and post op pain limiting functional mobility.  Pt should progress to dc home with family assist and reports first OP PT scheduled for 09/02/21.     Recommendations for follow up therapy are one component of a multi-disciplinary discharge planning process, led by the attending physician.  Recommendations may be updated based on patient status, additional functional criteria and insurance authorization.  Follow Up Recommendations Follow physician's recommendations for discharge plan and follow up therapies    Assistance Recommended at Discharge Intermittent Supervision/Assistance  Functional Status Assessment Patient has had a recent decline in their functional status and demonstrates the ability to make significant improvements in function in a reasonable and predictable amount of time.  Equipment Recommendations  None recommended by PT    Recommendations for Other Services       Precautions / Restrictions Precautions Precautions: Knee;Fall Restrictions Weight Bearing Restrictions: No RLE Weight Bearing: Weight bearing as tolerated      Mobility  Bed Mobility Overal bed mobility: Needs Assistance Bed Mobility: Supine to Sit     Supine to sit: Min assist     General bed mobility comments: spouse assisting minimally    Transfers Overall transfer level: Needs assistance Equipment used: Rolling walker (2 wheels) Transfers: Sit to/from Stand Sit to Stand: Min guard           General transfer comment: steady assist with cues for LE management and use of UEs to self assist    Ambulation/Gait Ambulation/Gait assistance: Min assist;Min guard Gait Distance (Feet): 100 Feet Assistive device:  Rolling walker (2 wheels) Gait Pattern/deviations: Step-to pattern;Decreased step length - right;Decreased step length - left;Shuffle;Trunk flexed Gait velocity: decr     General Gait Details: cues for posture, position from RW and initial sequence  Stairs            Wheelchair Mobility    Modified Rankin (Stroke Patients Only)       Balance Overall balance assessment: Needs assistance Sitting-balance support: No upper extremity supported;Feet supported Sitting balance-Leahy Scale: Good     Standing balance support: Single extremity supported Standing balance-Leahy Scale: Poor                               Pertinent Vitals/Pain Pain Assessment: 0-10 Pain Score: 2  Pain Location: R knee/thigh Pain Descriptors / Indicators: Aching;Sore Pain Intervention(s): Limited activity within patient's tolerance;Monitored during session;Premedicated before session;Ice applied    Home Living Family/patient expects to be discharged to:: Private residence Living Arrangements: Spouse/significant other Available Help at Discharge: Family;Available 24 hours/day Type of Home: House Home Access: Stairs to enter Entrance Stairs-Rails: Psychiatric nurse of Steps: 7   Home Layout: One level Home Equipment: Conservation officer, nature (2 wheels)      Prior Function Prior Level of Function : Independent/Modified Independent                     Hand Dominance        Extremity/Trunk Assessment   Upper Extremity Assessment Upper Extremity Assessment: Overall WFL for tasks assessed    Lower Extremity Assessment Lower Extremity Assessment: RLE deficits/detail RLE Deficits / Details: 3/5 quads with IND SLR;  AAROM  at knee -5 - 70    Cervical / Trunk Assessment Cervical / Trunk Assessment: Normal  Communication   Communication: No difficulties  Cognition Arousal/Alertness: Awake/alert Behavior During Therapy: WFL for tasks assessed/performed Overall  Cognitive Status: Within Functional Limits for tasks assessed                                          General Comments      Exercises Total Joint Exercises Ankle Circles/Pumps: AROM;Both;15 reps;Supine Quad Sets: AROM;Both;10 reps;Supine Heel Slides: AAROM;Right;15 reps;Supine Hip ABduction/ADduction: AAROM;Right;10 reps;Supine Straight Leg Raises: AAROM;AROM;Right;15 reps;Supine Long Arc Quad: AAROM;AROM;Right;10 reps;Seated   Assessment/Plan    PT Assessment Patient needs continued PT services  PT Problem List Decreased strength;Decreased range of motion;Decreased activity tolerance;Decreased balance;Decreased mobility;Decreased knowledge of use of DME;Pain       PT Treatment Interventions DME instruction;Gait training;Stair training;Functional mobility training;Therapeutic activities;Therapeutic exercise;Patient/family education    PT Goals (Current goals can be found in the Care Plan section)  Acute Rehab PT Goals Patient Stated Goal: Regain IND PT Goal Formulation: With patient Time For Goal Achievement: 09/04/21 Potential to Achieve Goals: Good    Frequency 7X/week   Barriers to discharge        Co-evaluation               AM-PAC PT "6 Clicks" Mobility  Outcome Measure Help needed turning from your back to your side while in a flat bed without using bedrails?: None Help needed moving from lying on your back to sitting on the side of a flat bed without using bedrails?: A Little Help needed moving to and from a bed to a chair (including a wheelchair)?: A Little Help needed standing up from a chair using your arms (e.g., wheelchair or bedside chair)?: A Little Help needed to walk in hospital room?: A Little Help needed climbing 3-5 steps with a railing? : A Little 6 Click Score: 19    End of Session Equipment Utilized During Treatment: Gait belt Activity Tolerance: Patient tolerated treatment well;Patient limited by fatigue Patient left:  in chair;with call bell/phone within reach;with family/visitor present Nurse Communication: Mobility status PT Visit Diagnosis: Difficulty in walking, not elsewhere classified (R26.2)    Time: 1102-1117 PT Time Calculation (min) (ACUTE ONLY): 29 min   Charges:   PT Evaluation $PT Eval Low Complexity: 1 Low PT Treatments $Therapeutic Exercise: 8-22 mins        Debe Coder PT Acute Rehabilitation Services Pager [phone] Office [phone]   Stefany Starace 08/28/2021, 10:50 AM

## 2021-08-28 NOTE — Discharge Summary (Signed)
Physician Discharge Summary  Patient ID: Nariah Morgano MRN: [id number] DOB/AGE: [birth year] 63 y.o.  Admit date: 08/27/2021 Discharge date: 08/28/2021  Admission Diagnoses:  Osteoarthritis of right knee  Discharge Diagnoses:  Principal Problem:   Osteoarthritis of right knee Active Problems:   S/P total knee arthroplasty, right   Past Medical History:  Diagnosis Date   Arthritis    Diabetes mellitus without complication ([place])    Ruptured ovarian cyst     Surgeries: Procedure(s): COMPUTER ASSISTED TOTAL KNEE ARTHROPLASTY on 08/27/2021   Consultants (if any):   Discharged Condition: Improved  Hospital Course: Kenzli Barritt is an 65 y.o. female who was admitted 08/27/2021 with a diagnosis of Osteoarthritis of right knee and went to the operating room on 08/27/2021 and underwent the above named procedures.    She was given perioperative antibiotics:  Anti-infectives (From admission, onward)    Start     Dose/Rate Route Frequency Ordered Stop   08/27/21 1730  ceFAZolin (ANCEF) IVPB 2g/100 mL premix        2 g 200 mL/hr over 30 Minutes Intravenous Every 6 hours 08/27/21 1316 08/28/21 0017   08/27/21 0900  ceFAZolin (ANCEF) IVPB 2g/100 mL premix        2 g 200 mL/hr over 30 Minutes Intravenous On call to O.R. 08/27/21 0845 08/27/21 1153     .  She was given sequential compression devices, early ambulation, and ASA for DVT prophylaxis.  She benefited maximally from the hospital stay and there were no complications.    Recent vital signs:  Vitals:   08/28/21 0129 08/28/21 0535  BP: (!) 109/52 (!) 102/56  Pulse: 65 66  Resp: 17 18  Temp: 98.7 F (37.1 C) 99.1 F (37.3 C)  SpO2: 96% 96%    Recent laboratory studies:  Lab Results  Component Value Date   HGB 10.8 (L) 08/28/2021   HGB 12.6 08/25/2021   HGB 12.5 03/04/2021   Lab Results  Component Value Date   WBC 12.9 (H) 08/28/2021   PLT 246 08/28/2021   Lab Results  Component Value Date   INR 1.0 08/25/2021    Lab Results  Component Value Date   NA 137 08/28/2021   K 4.2 08/28/2021   CL 106 08/28/2021   CO2 25 08/28/2021   BUN 15 08/28/2021   CREATININE 0.53 08/28/2021   GLUCOSE 195 (H) 08/28/2021     WEIGHT BEARING   Weight bearing as tolerated with assist device (walker, cane, etc) as directed, use it as long as suggested by your surgeon or therapist, typically at least 4-6 weeks.   EXERCISES  Results after joint replacement surgery are often greatly improved when you follow the exercise, range of motion and muscle strengthening exercises prescribed by your doctor. Safety measures are also important to protect the joint from further injury. Any time any of these exercises cause you to have increased pain or swelling, decrease what you are doing until you are comfortable again and then slowly increase them. If you have problems or questions, call your caregiver or physical therapist for advice.   Rehabilitation is important following a joint replacement. After just a few days of immobilization, the muscles of the leg can become weakened and shrink (atrophy).  These exercises are designed to build up the tone and strength of the thigh and leg muscles and to improve motion. Often times heat used for twenty to thirty minutes before working out will loosen up your tissues and help with improving the range of  motion but do not use heat for the first two weeks following surgery (sometimes heat can increase post-operative swelling).   These exercises can be done on a training (exercise) mat, on the floor, on a table or on a bed. Use whatever works the best and is most comfortable for you.    Use music or television while you are exercising so that the exercises are a pleasant break in your day. This will make your life better with the exercises acting as a break in your routine that you can look forward to.   Perform all exercises about fifteen times, three times per day or as directed.  You should  exercise both the operative leg and the other leg as well.  Exercises include:   Quad Sets - Tighten up the muscle on the front of the thigh (Quad) and hold for 5-10 seconds.   Straight Leg Raises - With your knee straight (if you were given a brace, keep it on), lift the leg to 60 degrees, hold for 3 seconds, and slowly lower the leg.  Perform this exercise against resistance later as your leg gets stronger.  Leg Slides: Lying on your back, slowly slide your foot toward your buttocks, bending your knee up off the floor (only go as far as is comfortable). Then slowly slide your foot back down until your leg is flat on the floor again.  Angel Wings: Lying on your back spread your legs to the side as far apart as you can without causing discomfort.  Hamstring Strength:  Lying on your back, push your heel against the floor with your leg straight by tightening up the muscles of your buttocks.  Repeat, but this time bend your knee to a comfortable angle, and push your heel against the floor.  You may put a pillow under the heel to make it more comfortable if necessary.   A rehabilitation program following joint replacement surgery can speed recovery and prevent re-injury in the future due to weakened muscles. Contact your doctor or a physical therapist for more information on knee rehabilitation.    CONSTIPATION  Constipation is defined medically as fewer than three stools per week and severe constipation as less than one stool per week.  Even if you have a regular bowel pattern at home, your normal regimen is likely to be disrupted due to multiple reasons following surgery.  Combination of anesthesia, postoperative narcotics, change in appetite and fluid intake all can affect your bowels.   YOU MUST use at least one of the following options; they are listed in order of increasing strength to get the job done.  They are all available over the counter, and you may need to use some, POSSIBLY even all of  these options:    Drink plenty of fluids (prune juice may be helpful) and high fiber foods Colace 100 mg by mouth twice a day  Senokot for constipation as directed and as needed Dulcolax (bisacodyl), take with full glass of water  Miralax (polyethylene glycol) once or twice a day as needed.  If you have tried all these things and are unable to have a bowel movement in the first 3-4 days after surgery call either your surgeon or your primary doctor.    If you experience loose stools or diarrhea, hold the medications until you stool forms back up.  If your symptoms do not get better within 1 week or if they get worse, check with your doctor.  If you experience "the  worst abdominal pain ever" or develop nausea or vomiting, please contact the office immediately for further recommendations for treatment.   ITCHING:  If you experience itching with your medications, try taking only a single pain pill, or even half a pain pill at a time.  You can also use Benadryl over the counter for itching or also to help with sleep.   TED HOSE STOCKINGS:  Use stockings on both legs until for at least 2 weeks or as directed by physician office. They may be removed at night for sleeping.  MEDICATIONS:  See your medication summary on the After Visit Summary that nursing will review with you.  You may have some home medications which will be placed on hold until you complete the course of blood thinner medication.  It is important for you to complete the blood thinner medication as prescribed.  PRECAUTIONS:  If you experience chest pain or shortness of breath - call 911 immediately for transfer to the hospital emergency department.   If you develop a fever greater that 101 F, purulent drainage from wound, increased redness or drainage from wound, foul odor from the wound/dressing, or calf pain - CONTACT YOUR SURGEON.                                                   FOLLOW-UP APPOINTMENTS:  If you do not already have  a post-op appointment, please call the office for an appointment to be seen by your surgeon.  Guidelines for how soon to be seen are listed in your After Visit Summary, but are typically between 1-4 weeks after surgery.  OTHER INSTRUCTIONS:   Knee Replacement:  Do not place pillow under knee, focus on keeping the knee straight while resting. CPM instructions: 0-90 degrees, 2 hours in the morning, 2 hours in the afternoon, and 2 hours in the evening. Place foam block, curve side up under heel at all times except when in CPM or when walking.  DO NOT modify, tear, cut, or change the foam block in any way.   MAKE SURE YOU:  Understand these instructions.  Get help right away if you are not doing well or get worse.    Thank you for letting us be a part of your medical care team.  It is a privilege we respect greatly.  We hope these instructions will help you stay on track for a fast and full recovery!   Diagnostic Studies: DG Knee Right Port  Result Date: 08/27/2021 CLINICAL DATA:  Status post right knee replacement EXAM: PORTABLE RIGHT KNEE - 1-2 VIEW COMPARISON:  None. FINDINGS: Status post right knee total arthroplasty with expected overlying postoperative change. No evidence of perihardware fracture or component malpositioning. IMPRESSION: Status post right knee total arthroplasty with expected overlying postoperative change. No evidence of perihardware fracture or component malpositioning. Electronically Signed   By: Delanna Ahmadi M.D.   On: 08/27/2021 13:54    Disposition: Discharge disposition: 01-Home or Self Care       Discharge Instructions     Call MD / Call 911   Complete by: As directed    If you experience chest pain or shortness of breath, CALL 911 and be transported to the hospital emergency room.  If you develope a fever above 101 F, pus (white drainage) or increased drainage or redness at the wound,  or calf pain, call your surgeon's office.   Call MD / Call 911    Complete by: As directed    If you experience chest pain or shortness of breath, CALL 911 and be transported to the hospital emergency room.  If you develope a fever above 101 F, pus (white drainage) or increased drainage or redness at the wound, or calf pain, call your surgeon's office.   Constipation Prevention   Complete by: As directed    Drink plenty of fluids.  Prune juice may be helpful.  You may use a stool softener, such as Colace (over the counter) 100 mg twice a day.  Use MiraLax (over the counter) for constipation as needed.   Constipation Prevention   Complete by: As directed    Drink plenty of fluids.  Prune juice may be helpful.  You may use a stool softener, such as Colace (over the counter) 100 mg twice a day.  Use MiraLax (over the counter) for constipation as needed.   Diet - low sodium heart healthy   Complete by: As directed    Diet - low sodium heart healthy   Complete by: As directed    Do not put a pillow under the knee. Place it under the heel.   Complete by: As directed    Do not put a pillow under the knee. Place it under the heel.   Complete by: As directed    Driving restrictions   Complete by: As directed    No driving for 6 weeks   Driving restrictions   Complete by: As directed    No driving for 6 weeks   Increase activity slowly as tolerated   Complete by: As directed    Increase activity slowly as tolerated   Complete by: As directed    Lifting restrictions   Complete by: As directed    No lifting for 6 weeks   Lifting restrictions   Complete by: As directed    No lifting for 6 weeks   Post-operative opioid taper instructions:   Complete by: As directed    POST-OPERATIVE OPIOID TAPER INSTRUCTIONS: It is important to wean off of your opioid medication as soon as possible. If you do not need pain medication after your surgery it is ok to stop day one. Opioids include: Codeine, Hydrocodone(Norco, Vicodin), Oxycodone(Percocet, oxycontin) and  hydromorphone amongst others.  Long term and even short term use of opiods can cause: Increased pain response Dependence Constipation Depression Respiratory depression And more.  Withdrawal symptoms can include Flu like symptoms Nausea, vomiting And more Techniques to manage these symptoms Hydrate well Eat regular healthy meals Stay active Use relaxation techniques(deep breathing, meditating, yoga) Do Not substitute Alcohol to help with tapering If you have been on opioids for less than two weeks and do not have pain than it is ok to stop all together.  Plan to wean off of opioids This plan should start within one week post op of your joint replacement. Maintain the same interval or time between taking each dose and first decrease the dose.  Cut the total daily intake of opioids by one tablet each day Next start to increase the time between doses. The last dose that should be eliminated is the evening dose.      Post-operative opioid taper instructions:   Complete by: As directed    POST-OPERATIVE OPIOID TAPER INSTRUCTIONS: It is important to wean off of your opioid medication as soon as possible. If you do not  need pain medication after your surgery it is ok to stop day one. Opioids include: Codeine, Hydrocodone(Norco, Vicodin), Oxycodone(Percocet, oxycontin) and hydromorphone amongst others.  Long term and even short term use of opiods can cause: Increased pain response Dependence Constipation Depression Respiratory depression And more.  Withdrawal symptoms can include Flu like symptoms Nausea, vomiting And more Techniques to manage these symptoms Hydrate well Eat regular healthy meals Stay active Use relaxation techniques(deep breathing, meditating, yoga) Do Not substitute Alcohol to help with tapering If you have been on opioids for less than two weeks and do not have pain than it is ok to stop all together.  Plan to wean off of opioids This plan should start  within one week post op of your joint replacement. Maintain the same interval or time between taking each dose and first decrease the dose.  Cut the total daily intake of opioids by one tablet each day Next start to increase the time between doses. The last dose that should be eliminated is the evening dose.      TED hose   Complete by: As directed    Use stockings (TED hose) for 2 weeks on both leg(s).  You may remove them at night for sleeping.   TED hose   Complete by: As directed    Use stockings (TED hose) for 2 weeks on both leg(s).  You may remove them at night for sleeping.          Signed: Hilton Cork Olman Yono 08/28/2021, 8:26 AM

## 2021-08-28 NOTE — Progress Notes (Signed)
Patient discharged to home w/ husband. Given all belongings, instructions. Verbalized understanding of all instructions. Escorted to pov via w/c. 

## 2021-08-28 NOTE — TOC Transition Note (Signed)
Transition of Care [facility]) - CM/SW Discharge Note  Patient Details  Name: [name] [name] MRN: [id number] Date of Birth: Mar 21, 1956  [facility] CM/SW Contact:  Sherie Don, LCSW Phone Number: 08/28/2021, 10:51 AM  Clinical Narrative: Patient will discharge home after working with PT. CSW met with patient to confirm discharge plan. Patient will go to OPPT at [facility] with the first appointment scheduled for 09/02/21. Patient has a rolling walker and elevated toilets at home, so there are no DME needs at this time. TOC signing off.  Final next level of care: OP Rehab Barriers to Discharge: No Barriers Identified  Patient Goals and CMS Choice Patient states their goals for this hospitalization and ongoing recovery are:: Discharge home with OPPT at [facility] Choice offered to / list presented to : NA  Discharge Plan and Services       DME Arranged: N/A DME Agency: NA  Readmission Risk Interventions No flowsheet data found.

## 2021-10-06 ENCOUNTER — Other Ambulatory Visit: Payer: 59

## 2021-10-08 ENCOUNTER — Other Ambulatory Visit (INDEPENDENT_AMBULATORY_CARE_PROVIDER_SITE_OTHER): Payer: 59

## 2021-10-08 DIAGNOSIS — E119 Type 2 diabetes mellitus without complications: Secondary | ICD-10-CM

## 2021-10-08 LAB — HEMOGLOBIN A1C: Hgb A1c MFr Bld: 7.2 % — ABNORMAL HIGH (ref 4.6–6.5)

## 2021-10-08 NOTE — Addendum Note (Signed)
Addended by: Colin Benton on: 10/08/2021 08:43 AM   Modules accepted: Orders

## 2021-11-07 ENCOUNTER — Ambulatory Visit: Payer: Self-pay | Admitting: Orthopedic Surgery

## 2021-11-10 NOTE — Patient Instructions (Addendum)
DUE TO COVID-19 ONLY ONE VISITOR IS ALLOWED TO COME WITH YOU AND STAY IN THE WAITING ROOM ONLY DURING PRE OP AND PROCEDURE.   **NO VISITORS ARE ALLOWED IN THE SHORT STAY AREA OR RECOVERY ROOM!!**  IF YOU WILL BE ADMITTED INTO THE HOSPITAL YOU ARE ALLOWED ONLY TWO SUPPORT PEOPLE DURING VISITATION HOURS ONLY (7 AM -8PM)   The support person(s) must pass our screening, gel in and out, and wear a mask at all times, including in the patients room. Patients must also wear a mask when staff or their support person are in the room. Visitors GUEST BADGE MUST BE WORN VISIBLY  One adult visitor may remain with you overnight and MUST be in the room by 8 P.M.  No visitors under the age of 58. Any visitor under the age of 89 must be accompanied by an adult.        Your procedure is scheduled on: 11/20/21   Report to [facility] Main Entrance    Report to admitting at: 12:45 PM   Call this number if you have problems the morning of surgery [phone]   Do not eat food :After Midnight.   After Midnight you may have the following liquids until: 11:45 AM  DAY OF SURGERY  Water Black Coffee (sugar ok, NO MILK/CREAM OR CREAMERS)  Tea (sugar ok, NO MILK/CREAM OR CREAMERS) regular and decaf                             Plain Jell-O (NO RED)                                           Fruit ices (not with fruit pulp, NO RED)                                     Popsicles (NO RED)                                                                  Juice: apple, WHITE grape, WHITE cranberry Sports drinks like Gatorade (NO RED) Clear broth(vegetable,chicken,beef)               Drink  Ensure drink AT :11:45 AM the day of surgery.      The day of surgery:  Drink ONE (1) Pre-Surgery Clear Ensure or G2 at AM the morning of surgery. Drink in one sitting. Do not sip.  This drink was given to you during your hospital  pre-op appointment visit. Nothing else to drink after completing the  Pre-Surgery  Clear Ensure or G2.          If you have questions, please contact your surgeons office.    Oral Hygiene is also important to reduce your risk of infection.                                    Remember - BRUSH YOUR TEETH THE MORNING OF SURGERY WITH  YOUR REGULAR TOOTHPASTE   Do NOT smoke after Midnight   Take these medicines the morning of surgery with A SIP OF WATER: N/A  How to Manage Your Diabetes Before and After Surgery  Why is it important to control my blood sugar before and after surgery? Improving blood sugar levels before and after surgery helps healing and can limit problems. A way of improving blood sugar control is eating a healthy diet by:  Eating less sugar and carbohydrates  Increasing activity/exercise  Talking with your doctor about reaching your blood sugar goals High blood sugars (greater than 180 mg/dL) can raise your risk of infections and slow your recovery, so you will need to focus on controlling your diabetes during the weeks before surgery. Make sure that the doctor who takes care of your diabetes knows about your planned surgery including the date and location.  How do I manage my blood sugar before surgery? Check your blood sugar at least 4 times a day, starting 2 days before surgery, to make sure that the level is not too high or low. Check your blood sugar the morning of your surgery when you wake up and every 2 hours until you get to the Short Stay unit. If your blood sugar is less than 70 mg/dL, you will need to treat for low blood sugar: Do not take insulin. Treat a low blood sugar (less than 70 mg/dL) with  cup of clear juice (cranberry or apple), 4 glucose tablets, OR glucose gel. Recheck blood sugar in 15 minutes after treatment (to make sure it is greater than 70 mg/dL). If your blood sugar is not greater than 70 mg/dL on recheck, call [phone] for further instructions. Report your blood sugar to the short stay nurse when you get to Short  Stay.  If you are admitted to the hospital after surgery: Your blood sugar will be checked by the staff and you will probably be given insulin after surgery (instead of oral diabetes medicines) to make sure you have good blood sugar levels. The goal for blood sugar control after surgery is 80-180 mg/dL.   WHAT DO I DO ABOUT MY DIABETES MEDICATION?  Do not take oral diabetes medicines (pills) the morning of surgery.  THE DAY BEFORE SURGERY, take metformin as usual.       THE MORNING OF SURGERY, DO NOT TAKE ANY ORAL DIABETIC MEDICATIONS DAY OF YOUR SURGERY  Bring CPAP mask and tubing day of surgery.                              You may not have any metal on your body including hair pins, jewelry, and body piercing             Do not wear make-up, lotions, powders, perfumes/cologne, or deodorant  Do not wear nail polish including gel and S&S, artificial/acrylic nails, or any other type of covering on natural nails including finger and toenails. If you have artificial nails, gel coating, etc. that needs to be removed by a nail salon please have this removed prior to surgery or surgery may need to be canceled/ delayed if the surgeon/ anesthesia feels like they are unable to be safely monitored.   Do not shave  48 hours prior to surgery.    Do not bring valuables to the hospital. [place].  Contacts, dentures or bridgework may not be worn into surgery.   Bring small overnight bag day of surgery.    Patients discharged on the day of surgery will not be allowed to drive home.  Someone NEEDS to stay with you for the first 24 hours after anesthesia.   Special Instructions: Bring a copy of your healthcare power of attorney and living will documents         the day of surgery if you haven't scanned them before.              Please read over the following fact sheets you were given: IF YOU HAVE QUESTIONS ABOUT YOUR PRE-OP INSTRUCTIONS PLEASE  CALL [phone]     [facility] - Preparing for Surgery Before surgery, you can play an important role.  Because skin is not sterile, your skin needs to be as free of germs as possible.  You can reduce the number of germs on your skin by washing with CHG (chlorahexidine gluconate) soap before surgery.  CHG is an antiseptic cleaner which kills germs and bonds with the skin to continue killing germs even after washing. Please DO NOT use if you have an allergy to CHG or antibacterial soaps.  If your skin becomes reddened/irritated stop using the CHG and inform your nurse when you arrive at Short Stay. Do not shave (including legs and underarms) for at least 48 hours prior to the first CHG shower.  You may shave your face/neck. Please follow these instructions carefully:  1.  Shower with CHG Soap the night before surgery and the  morning of Surgery.  2.  If you choose to wash your hair, wash your hair first as usual with your  normal  shampoo.  3.  After you shampoo, rinse your hair and body thoroughly to remove the  shampoo.                           4.  Use CHG as you would any other liquid soap.  You can apply chg directly  to the skin and wash                       Gently with a scrungie or clean washcloth.  5.  Apply the CHG Soap to your body ONLY FROM THE NECK DOWN.   Do not use on face/ open                           Wound or open sores. Avoid contact with eyes, ears mouth and genitals (private parts).                       Wash face,  Genitals (private parts) with your normal soap.             6.  Wash thoroughly, paying special attention to the area where your surgery  will be performed.  7.  Thoroughly rinse your body with warm water from the neck down.  8.  DO NOT shower/wash with your normal soap after using and rinsing off  the CHG Soap.                9.  Pat yourself dry with a clean towel.            10.  Wear clean pajamas.  11.  Place clean sheets on your bed the night of  your first shower and do not  sleep with pets. Day of Surgery : Do not apply any lotions/deodorants the morning of surgery.  Please wear clean clothes to the hospital/surgery center.  FAILURE TO FOLLOW THESE INSTRUCTIONS MAY RESULT IN THE CANCELLATION OF YOUR SURGERY PATIENT SIGNATURE_________________________________  NURSE SIGNATURE__________________________________  ________________________________________________________________________   Adam Phenix  An incentive spirometer is a tool that can help keep your lungs clear and active. This tool measures how well you are filling your lungs with each breath. Taking long deep breaths may help reverse or decrease the chance of developing breathing (pulmonary) problems (especially infection) following: A long period of time when you are unable to move or be active. BEFORE THE PROCEDURE  If the spirometer includes an indicator to show your best effort, your nurse or respiratory therapist will set it to a desired goal. If possible, sit up straight or lean slightly forward. Try not to slouch. Hold the incentive spirometer in an upright position. INSTRUCTIONS FOR USE  Sit on the edge of your bed if possible, or sit up as far as you can in bed or on a chair. Hold the incentive spirometer in an upright position. Breathe out normally. Place the mouthpiece in your mouth and seal your lips tightly around it. Breathe in slowly and as deeply as possible, raising the piston or the ball toward the top of the column. Hold your breath for 3-5 seconds or for as long as possible. Allow the piston or ball to fall to the bottom of the column. Remove the mouthpiece from your mouth and breathe out normally. Rest for a few seconds and repeat Steps 1 through 7 at least 10 times every 1-2 hours when you are awake. Take your time and take a few normal breaths between deep breaths. The spirometer may include an indicator to show your best effort. Use the  indicator as a goal to work toward during each repetition. After each set of 10 deep breaths, practice coughing to be sure your lungs are clear. If you have an incision (the cut made at the time of surgery), support your incision when coughing by placing a pillow or rolled up towels firmly against it. Once you are able to get out of bed, walk around indoors and cough well. You may stop using the incentive spirometer when instructed by your caregiver.  RISKS AND COMPLICATIONS Take your time so you do not get dizzy or light-headed. If you are in pain, you may need to take or ask for pain medication before doing incentive spirometry. It is harder to take a deep breath if you are having pain. AFTER USE Rest and breathe slowly and easily. It can be helpful to keep track of a log of your progress. Your caregiver can provide you with a simple table to help with this. If you are using the spirometer at home, follow these instructions: [place] IF:  You are having difficultly using the spirometer. You have trouble using the spirometer as often as instructed. Your pain medication is not giving enough relief while using the spirometer. You develop fever of 100.5 F (38.1 C) or higher. SEEK IMMEDIATE MEDICAL CARE IF:  You cough up bloody sputum that had not been present before. You develop fever of 102 F (38.9 C) or greater. You develop worsening pain at or near the incision site. MAKE SURE YOU:  Understand these instructions. Will watch your condition. Will  get help right away if you are not doing well or get worse. Document Released: 01/04/2007 Document Revised: 11/16/2011 Document Reviewed: 03/07/2007 [facility] Patient Information 2014 [place], [place].   ________________________________________________________________________

## 2021-11-11 ENCOUNTER — Encounter (HOSPITAL_COMMUNITY)
Admission: RE | Admit: 2021-11-11 | Discharge: 2021-11-11 | Disposition: A | Discharge status: Home / Self Care | Payer: 59 | Source: Ambulatory Visit | Attending: Orthopedic Surgery | Admitting: Orthopedic Surgery

## 2021-11-11 ENCOUNTER — Other Ambulatory Visit: Payer: Self-pay

## 2021-11-11 ENCOUNTER — Encounter (HOSPITAL_COMMUNITY): Payer: Self-pay

## 2021-11-11 VITALS — BP 146/85 | HR 82 | Temp 98.5°F | Resp 16 | Ht 61.0 in | Wt 148.0 lb

## 2021-11-11 DIAGNOSIS — Z01812 Encounter for preprocedural laboratory examination: Secondary | ICD-10-CM | POA: Insufficient documentation

## 2021-11-11 DIAGNOSIS — E119 Type 2 diabetes mellitus without complications: Secondary | ICD-10-CM | POA: Diagnosis not present

## 2021-11-11 HISTORY — DX: Other complications of anesthesia, initial encounter: T88.59XA

## 2021-11-11 HISTORY — DX: Nausea with vomiting, unspecified: R11.2

## 2021-11-11 HISTORY — DX: Other specified postprocedural states: Z98.890

## 2021-11-11 LAB — BASIC METABOLIC PANEL
Anion gap: 9 (ref 5–15)
BUN: 17 mg/dL (ref 8–23)
CO2: 26 mmol/L (ref 22–32)
Calcium: 9 mg/dL (ref 8.9–10.3)
Chloride: 100 mmol/L (ref 98–111)
Creatinine, Ser: 0.56 mg/dL (ref 0.44–1.00)
GFR, Estimated: >60 mL/min (ref >60)
Glucose, Bld: 145 mg/dL — ABNORMAL HIGH (ref 70–99)
Potassium: 3.9 mmol/L (ref 3.5–5.1)
Sodium: 135 mmol/L (ref 135–145)

## 2021-11-11 LAB — CBC
HCT: 38 % (ref 36.0–46.0)
Hemoglobin: 12.3 g/dL (ref 12.0–15.0)
MCH: 27 pg (ref 26.0–34.0)
MCHC: 32.4 g/dL (ref 30.0–36.0)
MCV: 83.5 fL (ref 80.0–100.0)
Platelets: 356 10*3/uL (ref 150–400)
RBC: 4.55 MIL/uL (ref 3.87–5.11)
RDW: 13.3 % (ref 11.5–15.5)
WBC: 7.4 10*3/uL (ref 4.0–10.5)
nRBC: 0 % (ref 0.0–0.2)

## 2021-11-11 LAB — GLUCOSE, CAPILLARY: Glucose-Capillary: 150 mg/dL — ABNORMAL HIGH (ref 70–99)

## 2021-11-11 NOTE — Progress Notes (Signed)
For Short Stay: ?[place] appointment date: N/A ?Date of COVID positive in last 90 days: N/A ?COVID Vaccine: Pfizer x 4: 07/2021 ?Bowel Prep reminder: ? ? ?For Anesthesia: ?PCP - Dr. Alysia Penna ?Cardiologist -  ? ?Chest x-ray -  ?EKG -  ?Stress Test -  ?ECHO -  ?Cardiac Cath -  ?Pacemaker/ICD device last checked: ?Pacemaker orders received: ?Device Rep notified: ? ?Spinal Cord Stimulator: ? ?Sleep Study -  ?CPAP -  ? ?Fasting Blood Sugar - N/A ?Checks Blood Sugar __0___ times a day ?Date and result of last Hgb A1c- 7.2: 10/08/21: EPIC ? ?Blood Thinner Instructions: ?Aspirin Instructions:NO instructions ?Last Dose: ? ?Activity level: Can go up a flight of stairs and activities of daily living without stopping and without chest pain and/or shortness of breath ?  Able to exercise without chest pain and/or shortness of breath ?  Unable to go up a flight of stairs without chest pain and/or shortness of breath ?   ? ?Anesthesia review:  ? ?Patient denies shortness of breath, fever, cough and chest pain at PAT appointment ? ? ?Patient verbalized understanding of instructions that were given to them at the PAT appointment. Patient was also instructed that they will need to review over the PAT instructions again at home before surgery.  ?

## 2021-11-13 ENCOUNTER — Other Ambulatory Visit: Payer: Self-pay | Admitting: Family Medicine

## 2021-11-20 ENCOUNTER — Other Ambulatory Visit: Payer: Self-pay

## 2021-11-20 ENCOUNTER — Ambulatory Visit (HOSPITAL_COMMUNITY): Payer: 59 | Admitting: Anesthesiology

## 2021-11-20 ENCOUNTER — Encounter (HOSPITAL_COMMUNITY): Payer: Self-pay | Admitting: Orthopedic Surgery

## 2021-11-20 ENCOUNTER — Encounter (HOSPITAL_COMMUNITY)
Admission: RE | Disposition: A | Discharge status: Home / Self Care | Payer: Self-pay | Source: Ambulatory Visit | Attending: Orthopedic Surgery

## 2021-11-20 ENCOUNTER — Ambulatory Visit (HOSPITAL_BASED_OUTPATIENT_CLINIC_OR_DEPARTMENT_OTHER): Payer: 59 | Admitting: Anesthesiology

## 2021-11-20 ENCOUNTER — Ambulatory Visit (HOSPITAL_COMMUNITY): Payer: 59

## 2021-11-20 ENCOUNTER — Ambulatory Visit (HOSPITAL_COMMUNITY)
Admission: RE | Admit: 2021-11-20 | Discharge: 2021-11-20 | Disposition: A | Discharge status: Home / Self Care | Payer: 59 | Source: Ambulatory Visit | Attending: Orthopedic Surgery | Admitting: Orthopedic Surgery

## 2021-11-20 DIAGNOSIS — Z7984 Long term (current) use of oral hypoglycemic drugs: Secondary | ICD-10-CM | POA: Insufficient documentation

## 2021-11-20 DIAGNOSIS — M24661 Ankylosis, right knee: Secondary | ICD-10-CM

## 2021-11-20 DIAGNOSIS — Z96651 Presence of right artificial knee joint: Secondary | ICD-10-CM | POA: Diagnosis not present

## 2021-11-20 DIAGNOSIS — E119 Type 2 diabetes mellitus without complications: Secondary | ICD-10-CM | POA: Insufficient documentation

## 2021-11-20 HISTORY — PX: KNEE CLOSED REDUCTION: SHX995

## 2021-11-20 LAB — GLUCOSE, CAPILLARY
Glucose-Capillary: 122 mg/dL — ABNORMAL HIGH (ref 70–99)
Glucose-Capillary: 107 mg/dL — ABNORMAL HIGH (ref 70–99)

## 2021-11-20 SURGERY — MANIPULATION, KNEE, CLOSED
Anesthesia: General | Site: Knee | Laterality: Right

## 2021-11-20 MED ORDER — EPHEDRINE SULFATE-NACL 50-0.9 MG/10ML-% IV SOSY
PREFILLED_SYRINGE | INTRAVENOUS | Status: DC | PRN
  Administered 2021-11-20: 5 mg via INTRAVENOUS

## 2021-11-20 MED ORDER — ONDANSETRON HCL 4 MG/2ML IJ SOLN
INTRAMUSCULAR | Status: DC | PRN
  Administered 2021-11-20: 4 mg via INTRAVENOUS

## 2021-11-20 MED ORDER — OXYCODONE HCL 5 MG PO TABS: 5.0000 mg | ORAL_TABLET | ORAL | 0 refills | Status: DC | PRN

## 2021-11-20 MED ORDER — PROPOFOL 10 MG/ML IV BOLUS
INTRAVENOUS | Status: AC
  Filled 2021-11-20: qty 20

## 2021-11-20 MED ORDER — LACTATED RINGERS IV SOLN: INTRAVENOUS | Status: DC

## 2021-11-20 MED ORDER — LIDOCAINE HCL (PF) 2 % IJ SOLN
INTRAMUSCULAR | Status: DC | PRN
  Administered 2021-11-20: 100 mg via INTRADERMAL

## 2021-11-20 MED ORDER — MIDAZOLAM HCL 2 MG/2ML IJ SOLN
INTRAMUSCULAR | Status: AC
  Filled 2021-11-20: qty 2

## 2021-11-20 MED ORDER — HYDROMORPHONE HCL 1 MG/ML IJ SOLN: 0.2500 mg | INTRAMUSCULAR | Status: DC | PRN

## 2021-11-20 MED ORDER — SODIUM CHLORIDE 0.9 % IV SOLN: INTRAVENOUS | Status: DC

## 2021-11-20 MED ORDER — ACETAMINOPHEN 500 MG PO TABS
1000.0000 mg | ORAL_TABLET | Freq: Once | ORAL | Status: AC
  Administered 2021-11-20: 1000 mg via ORAL
  Filled 2021-11-20: qty 2

## 2021-11-20 MED ORDER — DEXAMETHASONE SODIUM PHOSPHATE 10 MG/ML IJ SOLN
INTRAMUSCULAR | Status: DC | PRN
  Administered 2021-11-20: 4 mg via INTRAVENOUS

## 2021-11-20 MED ORDER — FENTANYL CITRATE (PF) 100 MCG/2ML IJ SOLN
INTRAMUSCULAR | Status: AC
  Filled 2021-11-20: qty 2

## 2021-11-20 MED ORDER — FENTANYL CITRATE PF 50 MCG/ML IJ SOSY
PREFILLED_SYRINGE | INTRAMUSCULAR | Status: AC
  Filled 2021-11-20: qty 2

## 2021-11-20 MED ORDER — ONDANSETRON HCL 4 MG/2ML IJ SOLN: 4.0000 mg | Freq: Once | INTRAMUSCULAR | Status: DC | PRN

## 2021-11-20 MED ORDER — ONDANSETRON HCL 4 MG/2ML IJ SOLN
INTRAMUSCULAR | Status: AC
  Filled 2021-11-20: qty 2

## 2021-11-20 MED ORDER — FENTANYL CITRATE PF 50 MCG/ML IJ SOSY
100.0000 ug | PREFILLED_SYRINGE | INTRAMUSCULAR | Status: DC
Start: 2021-11-20 — End: 2021-11-20
  Administered 2021-11-20: 100 ug via INTRAVENOUS

## 2021-11-20 MED ORDER — KETOROLAC TROMETHAMINE 15 MG/ML IJ SOLN: 15.0000 mg | Freq: Once | INTRAMUSCULAR | Status: DC

## 2021-11-20 MED ORDER — ROPIVACAINE HCL 5 MG/ML IJ SOLN
INTRAMUSCULAR | Status: DC | PRN
  Administered 2021-11-20 (×6): 5 mL via PERINEURAL

## 2021-11-20 MED ORDER — PHENYLEPHRINE 40 MCG/ML (10ML) SYRINGE FOR IV PUSH (FOR BLOOD PRESSURE SUPPORT)
PREFILLED_SYRINGE | INTRAVENOUS | Status: AC
  Filled 2021-11-20: qty 10

## 2021-11-20 MED ORDER — CLONIDINE HCL (ANALGESIA) 100 MCG/ML EP SOLN
EPIDURAL | Status: DC | PRN
  Administered 2021-11-20: 100 ug

## 2021-11-20 MED ORDER — MIDAZOLAM HCL 2 MG/2ML IJ SOLN
2.0000 mg | INTRAMUSCULAR | Status: DC
  Administered 2021-11-20: 2 mg via INTRAVENOUS

## 2021-11-20 MED ORDER — MEPERIDINE HCL 50 MG/ML IJ SOLN: 6.2500 mg | INTRAMUSCULAR | Status: DC | PRN

## 2021-11-20 MED ORDER — PROPOFOL 10 MG/ML IV BOLUS
INTRAVENOUS | Status: DC | PRN
  Administered 2021-11-20: 50 mg via INTRAVENOUS
  Administered 2021-11-20: 100 mg via INTRAVENOUS

## 2021-11-20 MED ORDER — DEXAMETHASONE SODIUM PHOSPHATE 10 MG/ML IJ SOLN
INTRAMUSCULAR | Status: AC
  Filled 2021-11-20: qty 1

## 2021-11-20 MED ORDER — ACETAMINOPHEN 10 MG/ML IV SOLN: 1000.0000 mg | Freq: Once | INTRAVENOUS | Status: DC | PRN

## 2021-11-20 MED ORDER — CHLORHEXIDINE GLUCONATE 0.12 % MT SOLN
15.0000 mL | Freq: Once | OROMUCOSAL | Status: AC
  Administered 2021-11-20: 15 mL via OROMUCOSAL

## 2021-11-20 MED ORDER — ORAL CARE MOUTH RINSE: 15.0000 mL | Freq: Once | OROMUCOSAL | Status: AC

## 2021-11-20 MED ORDER — PHENYLEPHRINE 40 MCG/ML (10ML) SYRINGE FOR IV PUSH (FOR BLOOD PRESSURE SUPPORT)
PREFILLED_SYRINGE | INTRAVENOUS | Status: DC | PRN
  Administered 2021-11-20: 80 ug via INTRAVENOUS

## 2021-11-20 MED ORDER — FENTANYL CITRATE (PF) 100 MCG/2ML IJ SOLN
INTRAMUSCULAR | Status: DC | PRN
Start: 2021-11-20 — End: 2021-11-20
  Administered 2021-11-20: 25 ug via INTRAVENOUS

## 2021-11-20 SURGICAL SUPPLY — 26 items
APL PRP STRL LF DISP 70% ISPRP (MISCELLANEOUS) ×1
BAG COUNTER SPONGE SURGICOUNT (BAG) IMPLANT
BAG SPNG CNTER NS LX DISP (BAG)
BNDG ADH 1X3 SHEER STRL LF (GAUZE/BANDAGES/DRESSINGS) IMPLANT
BNDG ADH THN 3X1 STRL LF (GAUZE/BANDAGES/DRESSINGS)
CHLORAPREP W/TINT 26 (MISCELLANEOUS) ×2 IMPLANT
COVER SURGICAL LIGHT HANDLE (MISCELLANEOUS) ×2 IMPLANT
GAUZE SPONGE 4X4 12PLY STRL (GAUZE/BANDAGES/DRESSINGS) IMPLANT
GLOVE SRG 8 PF TXTR STRL LF DI (GLOVE) ×1 IMPLANT
GLOVE SURG ENC TEXT LTX SZ7.5 (GLOVE) ×4 IMPLANT
GLOVE SURG NEOPR MICRO LF SZ8 (GLOVE) IMPLANT
GLOVE SURG ORTHO LTX SZ7.5 (GLOVE) ×4 IMPLANT
GLOVE SURG ORTHO LTX SZ8 (GLOVE) ×2 IMPLANT
GLOVE SURG UNDER POLY LF SZ7.5 (GLOVE) ×2 IMPLANT
GLOVE SURG UNDER POLY LF SZ8 (GLOVE) ×2
GLOVE SURG UNDER POLY LF SZ8.5 (GLOVE) ×2 IMPLANT
GOWN SPEC L3 XXLG W/TWL (GOWN DISPOSABLE) ×4 IMPLANT
JET LAVAGE IRRISEPT WOUND (IRRIGATION / IRRIGATOR)
LAVAGE JET IRRISEPT WOUND (IRRIGATION / IRRIGATOR) IMPLANT
MANIFOLD NEPTUNE II (INSTRUMENTS) ×2 IMPLANT
NDL SAFETY ECLIPSE 18X1.5 (NEEDLE) IMPLANT
NEEDLE HYPO 18GX1.5 SHARP (NEEDLE)
PENCIL SMOKE EVACUATOR (MISCELLANEOUS) IMPLANT
PROTECTOR NERVE ULNAR (MISCELLANEOUS) ×2 IMPLANT
SYR CONTROL 10ML LL (SYRINGE) IMPLANT
TOWEL OR 17X26 10 PK STRL BLUE (TOWEL DISPOSABLE) ×4 IMPLANT

## 2021-11-20 NOTE — Anesthesia Procedure Notes (Signed)
Anesthesia Regional Block: Adductor canal block  ? ?Pre-Anesthetic Checklist: , timeout performed,  Correct Patient, Correct Site, Correct Laterality,  Correct Procedure, Correct Position, site marked,  Risks and benefits discussed,  Surgical consent,  Pre-op evaluation,  At surgeon's request and post-op pain management ? ?Laterality: Lower and Right ? ?Prep: chloraprep     ?  ?Needles:  ?Injection technique: Single-shot ? ?Needle Type: Echogenic Stimulator Needle   ? ? ?Needle Length: 9cm  ?Needle Gauge: 20  ? ?Needle insertion depth: 3 cm ? ? ?Additional Needles: ? ? ?Procedures:,,,, ultrasound used (permanent image in chart),,    ?Narrative:  ?Start time: 11/20/2021 2:10 PM ?End time: 11/20/2021 2:16 PM ?Injection made incrementally with aspirations every 5 mL. ? ?Performed by: Personally  ?Anesthesiologist: Lyn Hollingshead, MD ? ? ? ? ?

## 2021-11-20 NOTE — Anesthesia Procedure Notes (Addendum)
Procedure Name: General with mask airway ?Date/Time: 11/20/2021 2:25 PM ?Performed by: West Pugh, CRNA ?Pre-anesthesia Checklist: Patient identified, Emergency Drugs available, Suction available and Patient being monitored ?Patient Re-evaluated:Patient Re-evaluated prior to induction ?Oxygen Delivery Method: Circle system utilized ?Preoxygenation: Pre-oxygenation with 100% oxygen ?Induction Type: IV induction ?Ventilation: Mask ventilation without difficulty ?Placement Confirmation: positive ETCO2 and breath sounds checked- equal and bilateral ?Dental Injury: Teeth and Oropharynx as per pre-operative assessment  ? ? ? ? ?

## 2021-11-20 NOTE — Transfer of Care (Signed)
Immediate Anesthesia Transfer of Care Note ? ?Patient: [name] [name] ? ?Procedure(s) Performed: CLOSED MANIPULATION KNEE (Right: Knee) ? ?Patient Location: PACU ? ?Anesthesia Type:General ? ?Level of Consciousness: drowsy ? ?Airway & Oxygen Therapy: Patient Spontanous Breathing and Patient connected to face mask oxygen ? ?Post-op Assessment: Report given to RN and Post -op Vital signs reviewed and stable ? ?Post vital signs: Reviewed and stable ? ?Last Vitals:  ?Vitals Value Taken Time  ?BP 134/65 11/20/21 1441  ?Temp 36.9 ?C 11/20/21 1440  ?Pulse 70 11/20/21 1445  ?Resp 9 11/20/21 1445  ?SpO2 100 % 11/20/21 1445  ?Vitals shown include unvalidated device data. ? ?Last Pain:  ?Vitals:  ? 11/20/21 1440  ?TempSrc:   ?PainSc: 0-No pain  ?   ? ?  ? ?Complications: No notable events documented. ?

## 2021-11-20 NOTE — H&P (Signed)
PREOPERATIVE H&P ? ?Chief Complaint: Right knee arthrofibrosis ? ?HPI: ?[name] [name] is a 66 y.o. female who presents for preoperative history and physical with a diagnosis of Right knee arthrofibrosis.   She has elected for surgical management.  ? ?Past Medical History:  ?Diagnosis Date  ? Arthritis   ? Complication of anesthesia   ? Diabetes mellitus without complication ([place])   ? PONV (postoperative nausea and vomiting)   ? Ruptured ovarian cyst   ? ?Past Surgical History:  ?Procedure Laterality Date  ? CESAREAN SECTION    ? COLONOSCOPY  11/25/2011  ? per Dr. Sharlett Iles, diverticulosis only, repeat in 10 yrs   ? KNEE ARTHROPLASTY Right 08/27/2021  ? Procedure: COMPUTER ASSISTED TOTAL KNEE ARTHROPLASTY;  Surgeon: Rod Can, MD;  Location: [facility] ORS;  Service: Orthopedics;  Laterality: Right;  ? lumpectomy,benign, right breast    ? x2  ? RIGHT SHOULDER SURGERY     ? ?Social History  ? ?Socioeconomic History  ? Marital status: Married  ?  Spouse name: Not on file  ? Number of children: 1  ? Years of education: Not on file  ? Highest education level: Not on file  ?Occupational History  ? Occupation: food service  ?Tobacco Use  ? Smoking status: Never  ? Smokeless tobacco: Never  ?Vaping Use  ? Vaping Use: Never used  ?Substance and Sexual Activity  ? Alcohol use: Yes  ?  Alcohol/week: 0.0 standard drinks  ?  Comment: maybe twice a month  ? Drug use: No  ? Sexual activity: Not on file  ?Other Topics Concern  ? Not on file  ?Social History Narrative  ? Married  ? Never Smoker  ? Alcohol use-yes  ? Drug use-no  ?   ?   ? ?Social Determinants of Health  ? ?Financial Resource Strain: Not on file  ?Food Insecurity: Not on file  ?Transportation Needs: Not on file  ?Physical Activity: Not on file  ?Stress: Not on file  ?Social Connections: Not on file  ? ?Family History  ?Problem Relation Age of Onset  ? Cancer Other   ?     breast cancer  ? Colon cancer Neg Hx   ? Esophageal cancer Neg Hx   ? Stomach cancer Neg Hx   ?  Rectal cancer Neg Hx   ? ?Allergies  ?Allergen Reactions  ? Hydrocodone Bit-[place]omatrop Mbr Nausea And Vomiting  ? ?Prior to Admission medications   ?Medication Sig Start Date End Date Taking? Authorizing Provider  ?aspirin EC 81 MG tablet Take 81 mg by mouth daily. Swallow whole.   Yes [provider]  ?ibuprofen (ADVIL) 200 MG tablet Take 400 mg by mouth every 6 (six) hours as needed for moderate pain or headache.   Yes [provider]  ?metFORMIN (GLUCOPHAGE) 500 MG tablet TAKE ONE TABLET BY MOUTH DAILY WITH BREAKFAST 11/14/21  Yes Laurey Morale, MD  ?Omega-3 Fatty Acids (FISH OIL PO) Take 1 capsule by mouth daily.   Yes [provider]  ?ondansetron (ZOFRAN) 4 MG tablet Take 1 tablet (4 mg total) by mouth every 8 (eight) hours as needed for nausea or vomiting. ?Patient not taking: Reported on 11/06/2021 08/27/21   Rod Can, MD  ?oxyCODONE (ROXICODONE) 5 MG immediate release tablet Take 1 tablet (5 mg total) by mouth every 4 (four) hours as needed for severe pain. ?Patient not taking: Reported on 11/06/2021 08/27/21   Rod Can, MD  ? ? ? ?Positive ROS: All other systems have been  reviewed and were otherwise negative with the exception of those mentioned in the HPI and as above. ? ?Physical Exam: ?General: Alert, no acute distress ?Cardiovascular: No pedal edema ?Respiratory: No cyanosis, no use of accessory musculature ?GI: No organomegaly, abdomen is soft and non-tender ?Skin: No lesions in the area of chief complaint ?Neurologic: Sensation intact distally ?Psychiatric: Patient is competent for consent with normal mood and affect ?Lymphatic: No axillary or cervical lymphadenopathy ? ?MUSCULOSKELETAL: right knee. Incision healed. Knee is stiff, ROM < 90 deg.NVI. ? ?Assessment: ?Right knee arthrofibrosis ? ?Plan: ?Plan for Procedure(s): ?CLOSED MANIPULATION KNEE ? ?The risks benefits and alternatives were discussed with the patient including but not limited to the risks of  nonoperative treatment, versus surgical intervention including infection, bleeding, nerve injury,  fracture, extensor mechanism disruption, blood clots, cardiopulmonary complications, morbidity, mortality, among others, and they were willing to proceed.  ? ?Bertram Savin, MD ?[phone] ? ? ?11/20/2021 ?1:49 PM ? ?

## 2021-11-20 NOTE — Op Note (Signed)
OPERATIVE REPORT ? ? ?11/20/2021 ? ?2:33 PM ? ?PATIENT:  [name] [name]  ? ?SURGEON:  Bertram Savin, MD ? ?ASSISTANT:  Staff.  ? 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 ? ?POSTOPERATIVE DIAGNOSIS:  Same. ? ?PROCEDURE: Manipulation of right knee under anesthesia. ? ?ANESTHESIA:   MAC. ?Regional. ? ?ANTIBIOTICS: None. ? ?IMPLANTS: None. ? ?SPECIMENS: None. ? ?COMPLICATIONS: None. ? ?DISPOSITION: Stable to PACU. ? ?SURGICAL INDICATIONS:  [name] [name] is a 66 y.o. female with a diagnosis of Right knee arthrofibrosis after primary right total knee replacement.  She has failed to improve her range of motion despite extensive physical therapy.  She was indicated for manipulation under anesthesia. ? ?The risks, benefits, and alternatives were discussed with the patient preoperatively including but not limited to the risk of fracture, extensor mechanism disruption, continued stiffness, cardiopulmonary complications, the need for repeat surgery, among others, and the patient was willing to proceed. ?  ?PROCEDURE IN DETAIL: The patient was identified in the holding area using 2 identifiers.  Adductor canal block was placed by anesthesia.  The surgical site was marked by myself.  She was taken to the operating room, and MAC was obtained on her stretcher.  Timeout was called, verifying site and site of procedure.  Antibiotics were not given, as none were indicated. ? ?Using gentle pressure over the proximal tibia, I was able to easily flex her down to greater than 110 degrees.  There is no ligamentous instability.  She has less than 10 degree flexion contracture. ? ?She was then aroused from anesthesia and taken to the PACU in stable condition.  Sponge, needle, and instrument counts were not performed as this was a closed procedure.  There were no known complications. ? ? ? ? ? ? ? ? ? ? ? ? ? ? ? ? ? ? ? ? ? ? ? ? ? 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 ?  ?POSTOPERATIVE DIAGNOSIS:  Same. ?  ?PROCEDURE: Manipulation of left knee under anesthesia. ?  ?ANESTHESIA:   MAC. ?  ?ANTIBIOTICS: None. ?  ?IMPLANTS: None. ?  ?SPECIMENS: None. ?  ?COMPLICATIONS: None. ?  ?DISPOSITION: Stable to PACU. ?  ?SURGICAL INDICATIONS:  [name] [name] is a 66 y.o. 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  She was indicated for manipulation under anesthesia. ?  ?The risks, benefits, and alternatives were discussed with the patient preoperatively including but not limited to the risk of fracture, extensor mechanism disruption, continued stiffness, cardiopulmonary complications, the need for repeat surgery, among others, and the patient was willing to proceed. ?  ?PROCEDURE IN DETAIL: The patient was identified in the holding area using 2 identifiers.  The surgical site was marked by myself.  She was taken to the operating room, and MAC was obtained on her stretcher.  Timeout was called, verifying site and site of procedure.  Antibiotics were not given, as none were indicated. ? ?Using gentle pressure over the proximal tibia, I was able to easily flex her down to greater than 120 degrees.  There is no ligamentous instability.  She does achieve full extension. ? ?She was then aroused from anesthesia and taken to the PACU in stable condition.  Sponge, needle, and instrument counts were not performed as this was a closed procedure.  There were no known complications. ?

## 2021-11-20 NOTE — Progress Notes (Signed)
Assisted Dr. Hatchett with right, ultrasound guided, adductor canal block. Side rails up, monitors on throughout procedure. See vital signs in flow sheet. Tolerated Procedure well.  

## 2021-11-20 NOTE — Anesthesia Preprocedure Evaluation (Addendum)
Anesthesia Evaluation  ?Patient identified by MRN, date of birth, ID band ?Patient awake ? ? ? ?History of Anesthesia Complications ?(+) PONV and history of anesthetic complications ? ?Airway ?Mallampati: I ? ? ? ? ? ? Dental ?no notable dental hx. ? ?  ?Pulmonary ?neg pulmonary ROS,  ?  ?Pulmonary exam normal ? ? ? ? ? ? ? Cardiovascular ?negative cardio ROS ?Normal cardiovascular exam ? ? ?  ?Neuro/Psych ?negative neurological ROS ? negative psych ROS  ? GI/Hepatic ?negative GI ROS, Neg liver ROS,   ?Endo/Other  ?diabetes, Type 2, Oral Hypoglycemic Agents ? Renal/GU ?negative Renal ROS  ?negative genitourinary ?  ?Musculoskeletal ? ? Abdominal ?Normal abdominal exam  (+)   ?Peds ? Hematology ?negative hematology ROS ?(+)   ?Anesthesia Other Findings ? ? Reproductive/Obstetrics ? ?  ? ? ? ? ? ? ? ? ? ? ? ? ? ?  ?  ? ? ? ? ? ? ? ?Anesthesia Physical ?Anesthesia Plan ? ?ASA: 2 ? ?Anesthesia Plan: General  ? ?Post-op Pain Management: Regional block*  ? ?Induction: Intravenous ? ?PONV Risk Score and Plan: 4 or greater and Ondansetron, Dexamethasone, Propofol infusion, TIVA and Midazolam ? ?Airway Management Planned: Mask and LMA ? ?Additional Equipment: None ? ?Intra-op Plan:  ? ?Post-operative Plan: Extubation in OR ? ?Informed Consent: I have reviewed the patients History and Physical, chart, labs and discussed the procedure including the risks, benefits and alternatives for the proposed anesthesia with the patient or authorized representative who has indicated his/her understanding and acceptance.  ? ? ? ?Dental advisory given ? ?Plan Discussed with: CRNA ? ?Anesthesia Plan Comments:   ? ? ? ? ? ?Anesthesia Quick Evaluation ? ?

## 2021-11-20 NOTE — Anesthesia Postprocedure Evaluation (Signed)
Anesthesia Post Note ? ?Patient: [name] [name] ? ?Procedure(s) Performed: CLOSED MANIPULATION KNEE (Right: Knee) ? ?  ? ?Patient location during evaluation: PACU ?Anesthesia Type: General ?Level of consciousness: awake ?Pain management: pain level controlled ?Vital Signs Assessment: post-procedure vital signs reviewed and stable ?Respiratory status: spontaneous breathing ?Cardiovascular status: stable ?Postop Assessment: no apparent nausea or vomiting ?Anesthetic complications: no ? ? ?No notable events documented. ? ?Last Vitals:  ?Vitals:  ? 11/20/21 1500 11/20/21 1515  ?BP: (!) 101/50 (!) 108/49  ?Pulse: 67 72  ?Resp: 14 12  ?Temp:  36.7 ?C  ?SpO2: 100% 95%  ?  ?Last Pain:  ?Vitals:  ? 11/20/21 1515  ?TempSrc:   ?PainSc: 0-No pain  ? ? ?  ?  ?  ?  ?  ?  ? ?Huston Foley ? ? ? ? ?

## 2021-11-20 NOTE — Discharge Instructions (Signed)
Resume physical therapy immediately. ?

## 2021-11-21 ENCOUNTER — Encounter (HOSPITAL_COMMUNITY): Payer: Self-pay | Admitting: Orthopedic Surgery

## 2022-02-13 ENCOUNTER — Other Ambulatory Visit: Payer: Self-pay | Admitting: Family Medicine

## 2022-03-09 ENCOUNTER — Ambulatory Visit (INDEPENDENT_AMBULATORY_CARE_PROVIDER_SITE_OTHER): Payer: 59 | Admitting: Family Medicine

## 2022-03-09 ENCOUNTER — Encounter: Payer: Self-pay | Admitting: Family Medicine

## 2022-03-09 VITALS — BP 158/76 | HR 61 | Temp 98.5°F | Wt 148.1 lb

## 2022-03-09 DIAGNOSIS — I10 Essential (primary) hypertension: Secondary | ICD-10-CM | POA: Diagnosis not present

## 2022-03-09 DIAGNOSIS — G8929 Other chronic pain: Secondary | ICD-10-CM

## 2022-03-09 DIAGNOSIS — Z0000 Encounter for general adult medical examination without abnormal findings: Secondary | ICD-10-CM

## 2022-03-09 DIAGNOSIS — M25562 Pain in left knee: Secondary | ICD-10-CM

## 2022-03-09 DIAGNOSIS — E119 Type 2 diabetes mellitus without complications: Secondary | ICD-10-CM | POA: Diagnosis not present

## 2022-03-09 DIAGNOSIS — M25561 Pain in right knee: Secondary | ICD-10-CM | POA: Diagnosis not present

## 2022-03-09 LAB — CBC WITH DIFFERENTIAL/PLATELET
Basophils Absolute: 0.1 10*3/uL (ref 0.0–0.1)
Basophils Relative: 0.6 % (ref 0.0–3.0)
Eosinophils Absolute: 0 10*3/uL (ref 0.0–0.7)
Eosinophils Relative: 0.3 % (ref 0.0–5.0)
HCT: 35.5 % — ABNORMAL LOW (ref 36.0–46.0)
Hemoglobin: 11.5 g/dL — ABNORMAL LOW (ref 12.0–15.0)
Lymphocytes Relative: 23.2 % (ref 12.0–46.0)
Lymphs Abs: 2.2 10*3/uL (ref 0.7–4.0)
MCHC: 32.4 g/dL (ref 30.0–36.0)
MCV: 83.4 fl (ref 78.0–100.0)
Monocytes Absolute: 0.5 10*3/uL (ref 0.1–1.0)
Monocytes Relative: 5.1 % (ref 3.0–12.0)
Neutro Abs: 6.6 10*3/uL (ref 1.4–7.7)
Neutrophils Relative %: 70.8 % (ref 43.0–77.0)
Platelets: 341 10*3/uL (ref 150.0–400.0)
RBC: 4.26 Mil/uL (ref 3.87–5.11)
RDW: 14.9 % (ref 11.5–15.5)
WBC: 9.3 10*3/uL (ref 4.0–10.5)

## 2022-03-09 LAB — BASIC METABOLIC PANEL
BUN: 18 mg/dL (ref 6–23)
CO2: 25 mEq/L (ref 19–32)
Calcium: 9.2 mg/dL (ref 8.4–10.5)
Chloride: 103 mEq/L (ref 96–112)
Creatinine, Ser: 0.55 mg/dL (ref 0.40–1.20)
GFR: 96.9 mL/min (ref >60.00)
Glucose, Bld: 106 mg/dL — ABNORMAL HIGH (ref 70–99)
Potassium: 3.9 mEq/L (ref 3.5–5.1)
Sodium: 137 mEq/L (ref 135–145)

## 2022-03-09 LAB — HEPATIC FUNCTION PANEL
ALT: 7 U/L (ref 0–35)
AST: 15 U/L (ref 0–37)
Albumin: 4.2 g/dL (ref 3.5–5.2)
Alkaline Phosphatase: 109 U/L (ref 39–117)
Bilirubin, Direct: 0.1 mg/dL (ref 0.0–0.3)
Total Bilirubin: 0.4 mg/dL (ref 0.2–1.2)
Total Protein: 7.9 g/dL (ref 6.0–8.3)

## 2022-03-09 LAB — LIPID PANEL
Cholesterol: 180 mg/dL (ref 0–200)
HDL: 57.3 mg/dL (ref >39.00)
LDL Cholesterol: 104 mg/dL — ABNORMAL HIGH (ref 0–99)
NonHDL: 122.59
Total CHOL/HDL Ratio: 3
Triglycerides: 93 mg/dL (ref 0.0–149.0)
VLDL: 18.6 mg/dL (ref 0.0–40.0)

## 2022-03-09 LAB — HEMOGLOBIN A1C: Hgb A1c MFr Bld: 7.9 % — ABNORMAL HIGH (ref 4.6–6.5)

## 2022-03-09 LAB — TSH: TSH: 1.1 u[IU]/mL (ref 0.35–5.50)

## 2022-03-09 MED ORDER — LOSARTAN POTASSIUM 50 MG PO TABS: 50.0000 mg | ORAL_TABLET | Freq: Every day | ORAL | 2 refills | Status: DC

## 2022-03-09 NOTE — Progress Notes (Signed)
Subjective:    Patient ID: [name] [name], female    DOB: [birth year], 66 y.o.   MRN: [id number]  HPI Here for a well exam. She has a few concerns today. First she has had trouble with both knees over the past year. This started out with right knee pain, and she ended up having a right total arthroplasty on 08-27-21 per Dr. Lyla Glassing. This relieved most of her pain, but she never regained full ROM. She had a closed reduction on 11-20-21, but this did not help all that much. She has since developed left knee pain also. Dr. Lyla Glassing determined this wwas some early OA, and she had one cortisone injection. This did not help much, so now she deals with issues in both knees. Dr. Lyla Glassing said she should return to full work duties in April, but she has really struggled with this ever since. She has tried Meloxicam with no improvement. She asks if she can get a second opinion about her knees. Also her BP at home has been increasing a bit. She now often has readings of 140s over 90s. No chest pain or SOB or headaches.   Review of Systems  Constitutional: Negative.   HENT: Negative.    Eyes: Negative.   Respiratory: Negative.    Cardiovascular: Negative.   Gastrointestinal: Negative.   Genitourinary:  Negative for decreased urine volume, difficulty urinating, dyspareunia, dysuria, enuresis, flank pain, frequency, hematuria, pelvic pain and urgency.  Musculoskeletal:  Positive for arthralgias.  Skin: Negative.   Neurological: Negative.  Negative for headaches.  Psychiatric/Behavioral: Negative.         Objective:   Physical Exam Constitutional:      General: She is not in acute distress.    Appearance: Normal appearance. She is well-developed.  HENT:     Head: Normocephalic and atraumatic.     Right Ear: External ear normal.     Left Ear: External ear normal.     Nose: Nose normal.     Mouth/Throat:     Pharynx: No oropharyngeal exudate.  Eyes:     General: No scleral icterus.     Conjunctiva/sclera: Conjunctivae normal.     Pupils: Pupils are equal, round, and reactive to light.  Neck:     Thyroid: No thyromegaly.     Vascular: No JVD.  Cardiovascular:     Rate and Rhythm: Normal rate and regular rhythm.     Heart sounds: Normal heart sounds. No murmur heard.    No friction rub. No gallop.  Pulmonary:     Effort: Pulmonary effort is normal. No respiratory distress.     Breath sounds: Normal breath sounds. No wheezing or rales.  Chest:     Chest wall: No tenderness.  Abdominal:     General: Bowel sounds are normal. There is no distension.     Palpations: Abdomen is soft. There is no mass.     Tenderness: There is no abdominal tenderness. There is no guarding or rebound.  Musculoskeletal:        General: No tenderness. Normal range of motion.     Cervical back: Normal range of motion and neck supple.  Lymphadenopathy:     Cervical: No cervical adenopathy.  Skin:    General: Skin is warm and dry.     Findings: No erythema or rash.  Neurological:     Mental Status: She is alert and oriented to person, place, and time.     Cranial Nerves: No cranial nerve deficit.  Motor: No abnormal muscle tone.     Coordination: Coordination normal.     Deep Tendon Reflexes: Reflexes are normal and symmetric. Reflexes normal.  Psychiatric:        Behavior: Behavior normal.        Thought Content: Thought content normal.        Judgment: Judgment normal.           Assessment & Plan:  Well exam. We discussed diet and exercise. Get fasting labs. For the knee issues, we will refer her to Sports Medicine to get their insights. She has developed mild HTN, so we will start her on Losartan 50 mg daily. Recheck here in 4 weeks.  Alysia Penna, MD

## 2022-03-11 ENCOUNTER — Other Ambulatory Visit: Payer: Self-pay

## 2022-03-11 DIAGNOSIS — E119 Type 2 diabetes mellitus without complications: Secondary | ICD-10-CM

## 2022-03-11 MED ORDER — METFORMIN HCL 1000 MG PO TABS: 1000.0000 mg | ORAL_TABLET | Freq: Two times a day (BID) | ORAL | 0 refills | Status: DC

## 2022-03-20 NOTE — Progress Notes (Unsigned)
    Subjective:    CC: B knee pain  I, Molly Weber, LAT, ATC, am serving as scribe for Dr. Lynne Leader.  HPI: Pt is a 66 y/o female presenting w/ c/o B knee pain x .  She locates her pain to .  She has a hx of a R TKA w/ Dr. Lyla Glassing on 08/27/21 and a R knee MUA on 11/20/21.  She started having L knee pain following her R knee surgeries.  Radiating pain: Knee swelling: Knee mechanical symptoms: Aggravating factors: Treatments tried: L knee steroid injection; Meloxicam;   Diagnostic testing: R knee XR- 11/20/21, 08/27/21; R knee MRI- 02/05/17, R knee XR- 11/1716  Pertinent review of Systems: ***  Relevant historical information: ***   Objective:   There were no vitals filed for this visit. General: Well Developed, well nourished, and in no acute distress.   MSK: ***  Lab and Radiology Results No results found for this or any previous visit (from the past 72 hour(s)). No results found.    Impression and Recommendations:    Assessment and Plan: 66 y.o. female with ***.  PDMP not reviewed this encounter. No orders of the defined types were placed in this encounter.  No orders of the defined types were placed in this encounter.   Discussed warning signs or symptoms. Please see discharge instructions. Patient expresses understanding.   ***

## 2022-03-23 ENCOUNTER — Ambulatory Visit (INDEPENDENT_AMBULATORY_CARE_PROVIDER_SITE_OTHER): Payer: 59 | Admitting: Family Medicine

## 2022-03-23 ENCOUNTER — Encounter: Payer: Self-pay | Admitting: Family Medicine

## 2022-03-23 ENCOUNTER — Ambulatory Visit: Payer: Self-pay

## 2022-03-23 ENCOUNTER — Ambulatory Visit (INDEPENDENT_AMBULATORY_CARE_PROVIDER_SITE_OTHER): Payer: 59

## 2022-03-23 VITALS — BP 126/76 | HR 82 | Ht 61.0 in | Wt 149.8 lb

## 2022-03-23 DIAGNOSIS — G8929 Other chronic pain: Secondary | ICD-10-CM

## 2022-03-23 DIAGNOSIS — M25561 Pain in right knee: Secondary | ICD-10-CM

## 2022-03-23 NOTE — Patient Instructions (Addendum)
Nice to meet you today.  Extended release tylenol  (acetaminophen) (Tylenol arthritis)    I've referred you to PT at [facility].  Their office will call you to schedule but please let us know if you don't hear from them in one week regarding scheduling.  Please get an Xray today before you leave.  Follow-up: 6 weeks.

## 2022-03-24 NOTE — Progress Notes (Signed)
Right knee x-ray looks good to radiology.  No hardware loosening.

## 2022-04-06 ENCOUNTER — Ambulatory Visit: Payer: 59 | Admitting: Family Medicine

## 2022-04-06 ENCOUNTER — Encounter: Payer: Self-pay | Admitting: Family Medicine

## 2022-04-06 VITALS — BP 128/80 | HR 76 | Temp 98.7°F | Wt 148.0 lb

## 2022-04-06 DIAGNOSIS — I10 Essential (primary) hypertension: Secondary | ICD-10-CM | POA: Diagnosis not present

## 2022-04-06 MED ORDER — LOSARTAN POTASSIUM 50 MG PO TABS: 50.0000 mg | ORAL_TABLET | Freq: Every day | ORAL | 3 refills | Status: DC

## 2022-04-06 NOTE — Progress Notes (Signed)
   Subjective:    Patient ID: [name] [name], female    DOB: [birth year], 66 y.o.   MRN: [id number]  HPI Here to follow up on HTN. Last time we started her on Losartan 50 mg daily, and this has worked quite well. Her BP at home is in the 701I or 103U systolic, and she feels fine. She saw Dr. Georgina Snell about the right knee pain, and he has set her up for PT.   Review of Systems  Constitutional: Negative.   Respiratory: Negative.    Cardiovascular: Negative.        Objective:   Physical Exam Constitutional:      Appearance: Normal appearance.  Cardiovascular:     Rate and Rhythm: Normal rate and regular rhythm.     Pulses: Normal pulses.     Heart sounds: Normal heart sounds.  Pulmonary:     Effort: Pulmonary effort is normal.     Breath sounds: Normal breath sounds.  Neurological:     Mental Status: She is alert.           Assessment & Plan:  HTN, now well controlled.  Alysia Penna, MD

## 2022-04-08 ENCOUNTER — Encounter: Payer: Self-pay | Admitting: Physical Therapy

## 2022-04-08 ENCOUNTER — Ambulatory Visit: Payer: 59 | Attending: Family Medicine | Admitting: Physical Therapy

## 2022-04-08 DIAGNOSIS — G8929 Other chronic pain: Secondary | ICD-10-CM | POA: Diagnosis not present

## 2022-04-08 DIAGNOSIS — M25661 Stiffness of right knee, not elsewhere classified: Secondary | ICD-10-CM | POA: Diagnosis present

## 2022-04-08 DIAGNOSIS — R262 Difficulty in walking, not elsewhere classified: Secondary | ICD-10-CM | POA: Diagnosis present

## 2022-04-08 DIAGNOSIS — M25561 Pain in right knee: Secondary | ICD-10-CM | POA: Diagnosis present

## 2022-04-08 NOTE — Therapy (Signed)
OUTPATIENT PHYSICAL THERAPY LOWER EXTREMITY EVALUATION   Patient Name: [name] [name] MRN: [id number] DOB:1955/11/27, 66 y.o., female Today's Date: 04/08/2022   PT End of Session - 04/08/22 1705     Visit Number 1    Number of Visits 2    Date for PT Re-Evaluation 07/09/22    Authorization Type [employer]    PT Start Time 1700    PT Stop Time 1745    PT Time Calculation (min) 45 min    Activity Tolerance Patient tolerated treatment well    Behavior During Therapy WFL for tasks assessed/performed             Past Medical History:  Diagnosis Date   Arthritis    Complication of anesthesia    Diabetes mellitus without complication (HCC)    PONV (postoperative nausea and vomiting)    Ruptured ovarian cyst    Past Surgical History:  Procedure Laterality Date   CESAREAN SECTION     COLONOSCOPY  06/05/2019   per Dr. Tarri Glenn, single adenomatous polyp, repeat in 7 yrs   KNEE ARTHROPLASTY Right 08/27/2021   Procedure: COMPUTER ASSISTED TOTAL KNEE ARTHROPLASTY;  Surgeon: Rod Can, MD;  Location: [facility];  Service: Orthopedics;  Laterality: Right;   KNEE CLOSED REDUCTION Right 11/20/2021   Procedure: CLOSED MANIPULATION KNEE;  Surgeon: Rod Can, MD;  Location: [facility];  Service: Orthopedics;  Laterality: Right;   lumpectomy,benign, right breast     x2   RIGHT SHOULDER SURGERY      Patient Active Problem List   Diagnosis Date Noted   Primary hypertension 03/09/2022   Osteoarthritis of right knee 08/27/2021   S/P total knee arthroplasty, 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   COVID-19 virus infection 04/09/2021   Dyslipidemia 03/31/2021   Urinary frequency 10/29/2017   Diabetes mellitus without complication ([place]) 32/67/1245   Special screening for malignant neoplasms, colon 11/25/2011   Diverticulosis of colon (without mention of hemorrhage) 11/25/2011   URTICARIA 01/31/2010   ECZEMA 05/03/2009    PCP: Sarajane Jews  REFERRING PROVIDER: Lonna Cobb DIAG: right knee pain  THERAPY DIAG:   Acute pain of right knee  Stiffness of right knee, not elsewhere classified  Difficulty in walking, not elsewhere classified  Rationale for Evaluation and Treatment Rehabilitation  ONSET DATE: 08/27/21  SUBJECTIVE:   SUBJECTIVE STATEMENT: Patient had right TKA December 2022, she did not do well with ROM, had a manipulation March, reports went back to work in April.  She still is having right knee pain and stiffness, she reports that recently the left knee is hurting.  X-rays negative.  Reports that the left knee feels better since the injection  PERTINENT HISTORY: Right TKA 08/27/21, Manipulation 11/20/21  PAIN:  Are you having pain? Yes: NPRS scale: 3-4/10 Pain location: right anterior , left lateral and posterior Pain description: sharp and ache Aggravating factors: bending the knee, working, standing, pain up to 8/10 Relieving factors: sitting  some pain medication pain at best 3/10  PRECAUTIONS: None  WEIGHT BEARING RESTRICTIONS No  FALLS:  Has patient fallen in last 6 months? No  LIVING ENVIRONMENT: Lives with: lives with their family Lives in: House/apartment Stairs: No  5 steps into the home Has following equipment at home: None  OCCUPATION: works at [employer], has to be on feet 8 hours a day, does some lifting  PLOF: Independent  started having right knee pain in 2017, was able to make it with injections  PATIENT GOALS have less pain and bend the knee better  OBJECTIVE:   DIAGNOSTIC FINDINGS: x-rays negative  PATIENT SURVEYS:  FOTO 43  COGNITION:  Overall cognitive status: Within functional limits for tasks assessed     SENSATION: Reports that the right knee is numb anterior  EDEMA:  Mild edema  MUSCLE LENGTH: Tight HS and calves  PALPATION: Slight warmth to touch anterior right knee , mild tenderness  LOWER EXTREMITY ROM:  ActivePassive ROM Right AROM eval Left PROM eval  Hip flexion    Hip extension    Hip abduction    Hip  adduction    Hip internal rotation    Hip external rotation    Knee flexion 83 90  Knee extension 15 8  Ankle dorsiflexion    Ankle plantarflexion    Ankle inversion    Ankle eversion     (Blank rows = not tested)  LOWER EXTREMITY MMT:  MMT Right eval Left eval  Hip flexion    Hip extension    Hip abduction    Hip adduction    Hip internal rotation    Hip external rotation    Knee flexion 4 4+  Knee extension 4 4+  Ankle dorsiflexion    Ankle plantarflexion    Ankle inversion    Ankle eversion     (Blank rows = not tested)  FUNCTIONAL TESTS:  Timed up and go (TUG): 14  GAIT: Distance walked: 100' Assistive device utilized: None Level of assistance: Complete Independence Comments: decreased right knee extension, mild antalgic on the right    TODAY'S TREATMENT:    PATIENT EDUCATION:  Education details: POC and low load long duration flexion stretch Person educated: Patient Education method: Consulting civil engineer, Demonstration, Tactile cues, Verbal cues, and Handouts Education comprehension: verbalized understanding, returned demonstration, and verbal cues required   HOME EXERCISE PROGRAM: As above  ASSESSMENT:  CLINICAL IMPRESSION: Patient is a 66  y.o. female who was seen today for physical therapy evaluation and treatment for right knee pain and stiffness, she did have left knee pain and is on the freferral but she reports that since the injeciton she has not had any issue.  She had right TKA in December, she underwent a manipulation in March, she has had PT, reports at best with PROM they got 103 degrees flexion and reports that the MD said during the manipulation when she was out all he got was 110 degrees flexion.  Her motion was much worse today.  She feels very tight in the quad.  She is working 40 hours a week    OBJECTIVE IMPAIRMENTS Abnormal gait, decreased activity tolerance, decreased balance, decreased mobility, difficulty walking, decreased ROM,  decreased strength, increased edema, impaired flexibility, impaired tone, and pain.   REHAB POTENTIAL: Good  CLINICAL DECISION MAKING: Stable/uncomplicated  EVALUATION COMPLEXITY: Low   GOALS: Goals reviewed with patient? Yes  SHORT TERM GOALS: Target date: 04/22/22 Independent with initial HEP Goal status: INITIAL  LONG TERM GOALS: Target date: 07/01/22  Independent with advanced HEP Goal status: INITIAL  2.  Decrease pain overall by 50% Goal status: INITIAL  3.  Increase PROM to 110 degrees flexion Goal status: INITIAL  4.  Increase AROM of the knee flexion to 105 degrees Goal status: INITIAL  PLAN: PT FREQUENCY: 1-2x/week  PT DURATION: 12 weeks  PLANNED INTERVENTIONS: Therapeutic exercises, Therapeutic activity, Neuromuscular re-education, Balance training, Gait training, Patient/Family education, Self Care, Joint mobilization, Stair training, Dry Needling, Electrical stimulation, Cryotherapy, Moist heat, Taping, Vasopneumatic device, and Manual therapy  PLAN FOR NEXT SESSION: work on  the flexion   Sumner Boast, PT 04/08/2022, 5:06 PM

## 2022-04-22 NOTE — Therapy (Signed)
OUTPATIENT PHYSICAL THERAPY LOWER EXTREMITY TREATMENT   Patient Name: [name] [name] MRN: [id number] DOB:1955/12/17, 66 y.o., female Today's Date: 04/22/2022     Past Medical History:  Diagnosis Date   Arthritis    Complication of anesthesia    Diabetes mellitus without complication (HCC)    PONV (postoperative nausea and vomiting)    Ruptured ovarian cyst    Past Surgical History:  Procedure Laterality Date   CESAREAN SECTION     COLONOSCOPY  06/05/2019   per Dr. Tarri Glenn, single adenomatous polyp, repeat in 7 yrs   KNEE ARTHROPLASTY Right 08/27/2021   Procedure: COMPUTER ASSISTED TOTAL KNEE ARTHROPLASTY;  Surgeon: Rod Can, MD;  Location: [facility];  Service: Orthopedics;  Laterality: Right;   KNEE CLOSED REDUCTION Right 11/20/2021   Procedure: CLOSED MANIPULATION KNEE;  Surgeon: Rod Can, MD;  Location: [facility];  Service: Orthopedics;  Laterality: Right;   lumpectomy,benign, right breast     x2   RIGHT SHOULDER SURGERY      Patient Active Problem List   Diagnosis Date Noted   Primary hypertension 03/09/2022   Osteoarthritis of right knee 08/27/2021   S/P total knee arthroplasty, 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   COVID-19 virus infection 04/09/2021   Dyslipidemia 03/31/2021   Urinary frequency 10/29/2017   Diabetes mellitus without complication ([place]) 51/88/4166   Special screening for malignant neoplasms, colon 11/25/2011   Diverticulosis of colon (without mention of hemorrhage) 11/25/2011   URTICARIA 01/31/2010   ECZEMA 05/03/2009    PCP: Sarajane Jews  REFERRING PROVIDER: Lonna Cobb DIAG: right knee pain  THERAPY DIAG:  No diagnosis found.  Rationale for Evaluation and Treatment Rehabilitation  ONSET DATE: 08/27/21  SUBJECTIVE:   SUBJECTIVE STATEMENT: Patient states her knee is not okay because she cannot bend it. Pain is a 4/10 today. She is working and on her feet for most of her shift.   PERTINENT HISTORY: Right TKA 08/27/21, Manipulation 11/20/21  PAIN:   Are you having pain? Yes: NPRS scale: 3-4/10 Pain location: right anterior , left lateral and posterior Pain description: sharp and ache Aggravating factors: bending the knee, working, standing, pain up to 8/10 Relieving factors: sitting  some pain medication pain at best 3/10  PRECAUTIONS: None  WEIGHT BEARING RESTRICTIONS No  FALLS:  Has patient fallen in last 6 months? No  LIVING ENVIRONMENT: Lives with: lives with their family Lives in: House/apartment Stairs: No  5 steps into the home Has following equipment at home: None  OCCUPATION: works at [employer], has to be on feet 8 hours a day, does some lifting  PLOF: Independent  started having right knee pain in 2017, was able to make it with injections  PATIENT GOALS have less pain and bend the knee better   OBJECTIVE:   DIAGNOSTIC FINDINGS: x-rays negative  PATIENT SURVEYS:  FOTO 43  COGNITION:  Overall cognitive status: Within functional limits for tasks assessed     SENSATION: Reports that the right knee is numb anterior  EDEMA:  Mild edema  MUSCLE LENGTH: Tight HS and calves  PALPATION: Slight warmth to touch anterior right knee , mild tenderness  LOWER EXTREMITY ROM:  ActivePassive ROM Right AROM eval Left PROM eval  Hip flexion    Hip extension    Hip abduction    Hip adduction    Hip internal rotation    Hip external rotation    Knee flexion 83 90  Knee extension 15 8  Ankle dorsiflexion    Ankle plantarflexion    Ankle inversion  Ankle eversion     (Blank rows = not tested)  LOWER EXTREMITY MMT:  MMT Right eval Left eval  Hip flexion    Hip extension    Hip abduction    Hip adduction    Hip internal rotation    Hip external rotation    Knee flexion 4 4+  Knee extension 4 4+  Ankle dorsiflexion    Ankle plantarflexion    Ankle inversion    Ankle eversion     (Blank rows = not tested)  FUNCTIONAL TESTS:  Timed up and go (TUG): 14  GAIT: Distance walked:  100' Assistive device utilized: None Level of assistance: Complete Independence Comments: decreased right knee extension, mild antalgic on the right    TODAY'S TREATMENT: 04/23/22 Nustep L4 x23mns Bike L2 x53ms (seat 9)  PROM flex/ext  Step ups 6" Knee ext 20# 2x10, 5# RLE x10  HS curls 20# 2x10, 10# RLE x10 Prone quad stretch 2x30s RLE  Bridges 2x10    PATIENT EDUCATION:  Education details: POC and low load long duration flexion stretch Person educated: Patient Education method: ExConsulting civil engineerDemonstration, Tactile cues, Verbal cues, and Handouts Education comprehension: verbalized understanding, returned demonstration, and verbal cues required   HOME EXERCISE PROGRAM: As above  ASSESSMENT:  CLINICAL IMPRESSION: Patient returns for her first PT session after evaluation. She is still having difficulty flexing her knee. She is able to get on the bike and bend it but shifts weight to left. She states when she walks its hard to bend but maybe it is in my mind. We worked on some strengthening and stretching to try to gain some more motion. Tolerates session well, will benefit from ongoing PT to address ROM deficits.    OBJECTIVE IMPAIRMENTS Abnormal gait, decreased activity tolerance, decreased balance, decreased mobility, difficulty walking, decreased ROM, decreased strength, increased edema, impaired flexibility, impaired tone, and pain.   REHAB POTENTIAL: Good  CLINICAL DECISION MAKING: Stable/uncomplicated  EVALUATION COMPLEXITY: Low   GOALS: Goals reviewed with patient? Yes  SHORT TERM GOALS: Target date: 04/22/22 Independent with initial HEP Goal status: INITIAL  LONG TERM GOALS: Target date: 07/01/22  Independent with advanced HEP Goal status: INITIAL  2.  Decrease pain overall by 50% Goal status: INITIAL  3.  Increase PROM to 110 degrees flexion Goal status: INITIAL  4.  Increase AROM of the knee flexion to 105 degrees Goal status: INITIAL  PLAN: PT  FREQUENCY: 1-2x/week  PT DURATION: 12 weeks  PLANNED INTERVENTIONS: Therapeutic exercises, Therapeutic activity, Neuromuscular re-education, Balance training, Gait training, Patient/Family education, Self Care, Joint mobilization, Stair training, Dry Needling, Electrical stimulation, Cryotherapy, Moist heat, Taping, Vasopneumatic device, and Manual therapy  PLAN FOR NEXT SESSION: work on the flexion, STS, leg press, MET    MoTRW AutomotivePT 04/22/2022, 4:58 PM

## 2022-04-23 ENCOUNTER — Ambulatory Visit: Payer: 59

## 2022-04-23 DIAGNOSIS — M25661 Stiffness of right knee, not elsewhere classified: Secondary | ICD-10-CM

## 2022-04-23 DIAGNOSIS — M25561 Pain in right knee: Secondary | ICD-10-CM | POA: Diagnosis not present

## 2022-04-23 DIAGNOSIS — R262 Difficulty in walking, not elsewhere classified: Secondary | ICD-10-CM

## 2022-04-27 ENCOUNTER — Ambulatory Visit: Payer: 59 | Admitting: Physical Therapy

## 2022-04-27 ENCOUNTER — Encounter: Payer: Self-pay | Admitting: Physical Therapy

## 2022-04-27 DIAGNOSIS — R262 Difficulty in walking, not elsewhere classified: Secondary | ICD-10-CM

## 2022-04-27 DIAGNOSIS — M25561 Pain in right knee: Secondary | ICD-10-CM

## 2022-04-27 DIAGNOSIS — M25661 Stiffness of right knee, not elsewhere classified: Secondary | ICD-10-CM

## 2022-04-27 NOTE — Therapy (Signed)
OUTPATIENT PHYSICAL THERAPY LOWER EXTREMITY TREATMENT   Patient Name: [name] [name] MRN: [id number] DOB:1956/08/31, 66 y.o., female Today's Date: 04/27/2022   PT End of Session - 04/27/22 0834     Visit Number 3    Number of Visits 48    Date for PT Re-Evaluation 07/09/22    Authorization Type [employer]    PT Start Time 0803    PT Stop Time 0841    PT Time Calculation (min) 38 min    Activity Tolerance Patient tolerated treatment well    Behavior During Therapy WFL for tasks assessed/performed              Past Medical History:  Diagnosis Date   Arthritis    Complication of anesthesia    Diabetes mellitus without complication (HCC)    PONV (postoperative nausea and vomiting)    Ruptured ovarian cyst    Past Surgical History:  Procedure Laterality Date   CESAREAN SECTION     COLONOSCOPY  06/05/2019   per Dr. Tarri Glenn, single adenomatous polyp, repeat in 7 yrs   KNEE ARTHROPLASTY Right 08/27/2021   Procedure: COMPUTER ASSISTED TOTAL KNEE ARTHROPLASTY;  Surgeon: Rod Can, MD;  Location: [facility];  Service: Orthopedics;  Laterality: Right;   KNEE CLOSED REDUCTION Right 11/20/2021   Procedure: CLOSED MANIPULATION KNEE;  Surgeon: Rod Can, MD;  Location: [facility];  Service: Orthopedics;  Laterality: Right;   lumpectomy,benign, right breast     x2   RIGHT SHOULDER SURGERY      Patient Active Problem List   Diagnosis Date Noted   Primary hypertension 03/09/2022   Osteoarthritis of right knee 08/27/2021   S/P total knee arthroplasty, 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   COVID-19 virus infection 04/09/2021   Dyslipidemia 03/31/2021   Urinary frequency 10/29/2017   Diabetes mellitus without complication ([place]) 92/42/6834   Special screening for malignant neoplasms, colon 11/25/2011   Diverticulosis of colon (without mention of hemorrhage) 11/25/2011   URTICARIA 01/31/2010   ECZEMA 05/03/2009    PCP: Sarajane Jews  REFERRING PROVIDER: Lonna Cobb DIAG: right knee pain  THERAPY  DIAG:  Stiffness of right knee, not elsewhere classified  Acute pain of right knee  Difficulty in walking, not elsewhere classified  Rationale for Evaluation and Treatment Rehabilitation  ONSET DATE: 08/27/21  SUBJECTIVE:   SUBJECTIVE STATEMENT:  The knee feels the same, I can't bend it. No falls recently.   PERTINENT HISTORY: Right TKA 08/27/21, Manipulation 11/20/21  PAIN:  Are you having pain? Yes: NPRS scale: 2/10 Pain location: right knee  Pain description: difficult to describe  Aggravating factors: bending the knee, working, standing, pain up to 8/10 Relieving factors: sitting  some pain medication pain at best 3/10  PRECAUTIONS: None  WEIGHT BEARING RESTRICTIONS No  FALLS:  Has patient fallen in last 6 months? No  LIVING ENVIRONMENT: Lives with: lives with their family Lives in: House/apartment Stairs: No  5 steps into the home Has following equipment at home: None  OCCUPATION: works at [employer], has to be on feet 8 hours a day, does some lifting  PLOF: Independent  started having right knee pain in 2017, was able to make it with injections  PATIENT GOALS have less pain and bend the knee better   OBJECTIVE:   DIAGNOSTIC FINDINGS: x-rays negative  PATIENT SURVEYS:  FOTO 43  COGNITION:  Overall cognitive status: Within functional limits for tasks assessed     SENSATION: Reports that the right knee is numb anterior  EDEMA:  Mild edema  MUSCLE  LENGTH: Tight HS and calves  PALPATION: Slight warmth to touch anterior right knee , mild tenderness  LOWER EXTREMITY ROM:  ActivePassive ROM Right AROM eval Left PROM eval  Hip flexion    Hip extension    Hip abduction    Hip adduction    Hip internal rotation    Hip external rotation    Knee flexion 83 90  Knee extension 15 8  Ankle dorsiflexion    Ankle plantarflexion    Ankle inversion    Ankle eversion     (Blank rows = not tested)  LOWER EXTREMITY MMT:  MMT Right eval Left eval   Hip flexion    Hip extension    Hip abduction    Hip adduction    Hip internal rotation    Hip external rotation    Knee flexion 4 4+  Knee extension 4 4+  Ankle dorsiflexion    Ankle plantarflexion    Ankle inversion    Ankle eversion     (Blank rows = not tested)  FUNCTIONAL TESTS:  Timed up and go (TUG): 14  GAIT: Distance walked: 100' Assistive device utilized: None Level of assistance: Complete Independence Comments: decreased right knee extension, mild antalgic on the right                     TODAY'S TREATMENT:                       8/21                       TherEx:                       -Bike L0 x6 minutes seat 7 for flexion ROM                      - knee flexion stretch on steps 10x10 second holds                      - bridges with red TB x15                      - hip sidelying ABD 1x10 B red TB                       - prone hip extensions red TB 1x10                     - walking bridges 1x10                      -forward and lateral step ups 6 inch step x15 B                    - eccentric step downs 1x10 B 4 inch step                      - hip hikes with and without swing 1x10 B                        Manual                     - knee flexion stretching/ROM- only to 78-80 degrees today, very stiff                     -  patella mobs all directions grade III   8/17  Nustep L4 x14mns Bike L2 x536ms (seat 9)  PROM flex/ext  Step ups 6" Knee ext 20# 2x10, 5# RLE x10  HS curls 20# 2x10, 10# RLE x10 Prone quad stretch 2x30s RLE  Bridges 2x10    PATIENT EDUCATION:  Education details: POC and low load long duration flexion stretch Person educated: Patient Education method: ExConsulting civil engineerDemonstration, Tactile cues, Verbal cues, and Handouts Education comprehension: verbalized understanding, returned demonstration, and verbal cues required   HOME EXERCISE PROGRAM: As above  ASSESSMENT:  CLINICAL IMPRESSION:  [name] today doing OK,  still having pain in her R knee when bending it. We focused on flexion ROM today as well as general proximal strength and closed chain exercises this morning. More swollen than usual this morning, which did limit ROM. Will continue to progress as able and tolerated.   OBJECTIVE IMPAIRMENTS Abnormal gait, decreased activity tolerance, decreased balance, decreased mobility, difficulty walking, decreased ROM, decreased strength, increased edema, impaired flexibility, impaired tone, and pain.   REHAB POTENTIAL: Good  CLINICAL DECISION MAKING: Stable/uncomplicated  EVALUATION COMPLEXITY: Low   GOALS: Goals reviewed with patient? Yes  SHORT TERM GOALS: Target date: 04/22/22 Independent with initial HEP Goal status: INITIAL  LONG TERM GOALS: Target date: 07/01/22  Independent with advanced HEP Goal status: INITIAL  2.  Decrease pain overall by 50% Goal status: INITIAL  3.  Increase PROM to 110 degrees flexion Goal status: INITIAL  4.  Increase AROM of the knee flexion to 105 degrees Goal status: INITIAL  PLAN: PT FREQUENCY: 1-2x/week  PT DURATION: 12 weeks  PLANNED INTERVENTIONS: Therapeutic exercises, Therapeutic activity, Neuromuscular re-education, Balance training, Gait training, Patient/Family education, Self Care, Joint mobilization, Stair training, Dry Needling, Electrical stimulation, Cryotherapy, Moist heat, Taping, Vasopneumatic device, and Manual therapy  PLAN FOR NEXT SESSION: work on the flexion, STS, leg press, MET    Orva Gwaltney U PT DPT PN2  04/27/2022, 8:42 AM

## 2022-04-29 ENCOUNTER — Ambulatory Visit: Payer: 59 | Admitting: Physical Therapy

## 2022-04-29 ENCOUNTER — Encounter: Payer: Self-pay | Admitting: Physical Therapy

## 2022-04-29 DIAGNOSIS — M25561 Pain in right knee: Secondary | ICD-10-CM

## 2022-04-29 DIAGNOSIS — M25661 Stiffness of right knee, not elsewhere classified: Secondary | ICD-10-CM

## 2022-04-29 DIAGNOSIS — R262 Difficulty in walking, not elsewhere classified: Secondary | ICD-10-CM

## 2022-04-29 NOTE — Therapy (Signed)
OUTPATIENT PHYSICAL THERAPY LOWER EXTREMITY TREATMENT   Patient Name: [name] [name] MRN: [id number] DOB:1956-02-17, 66 y.o., female Today's Date: 04/29/2022   PT End of Session - 04/29/22 1717     Visit Number 4    Number of Visits 9    Date for PT Re-Evaluation 07/09/22    Authorization Type [employer]    PT Start Time 6314    PT Stop Time 1756    PT Time Calculation (min) 41 min    Activity Tolerance Patient tolerated treatment well    Behavior During Therapy WFL for tasks assessed/performed               Past Medical History:  Diagnosis Date   Arthritis    Complication of anesthesia    Diabetes mellitus without complication (HCC)    PONV (postoperative nausea and vomiting)    Ruptured ovarian cyst    Past Surgical History:  Procedure Laterality Date   CESAREAN SECTION     COLONOSCOPY  06/05/2019   per Dr. Tarri Glenn, single adenomatous polyp, repeat in 7 yrs   KNEE ARTHROPLASTY Right 08/27/2021   Procedure: COMPUTER ASSISTED TOTAL KNEE ARTHROPLASTY;  Surgeon: Rod Can, MD;  Location: [facility];  Service: Orthopedics;  Laterality: Right;   KNEE CLOSED REDUCTION Right 11/20/2021   Procedure: CLOSED MANIPULATION KNEE;  Surgeon: Rod Can, MD;  Location: [facility];  Service: Orthopedics;  Laterality: Right;   lumpectomy,benign, right breast     x2   RIGHT SHOULDER SURGERY      Patient Active Problem List   Diagnosis Date Noted   Primary hypertension 03/09/2022   Osteoarthritis of right knee 08/27/2021   S/P total knee arthroplasty, 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   COVID-19 virus infection 04/09/2021   Dyslipidemia 03/31/2021   Urinary frequency 10/29/2017   Diabetes mellitus without complication ([place]) 97/10/6376   Special screening for malignant neoplasms, colon 11/25/2011   Diverticulosis of colon (without mention of hemorrhage) 11/25/2011   URTICARIA 01/31/2010   ECZEMA 05/03/2009    PCP: Sarajane Jews  REFERRING PROVIDER: Lonna Cobb DIAG: right knee pain  THERAPY  DIAG:  Stiffness of right knee, not elsewhere classified  Acute pain of right knee  Difficulty in walking, not elsewhere classified  Rationale for Evaluation and Treatment Rehabilitation  ONSET DATE: 08/27/21  SUBJECTIVE:   SUBJECTIVE STATEMENT: Patient reports that her knee remains tight. No pain except when she tries to bend it.  PERTINENT HISTORY: Right TKA 08/27/21, Manipulation 11/20/21  PAIN:  Are you having pain? Yes: NPRS scale: 2/10 Pain location: right knee  Pain description: difficult to describe  Aggravating factors: bending the knee, working, standing, pain up to 8/10 Relieving factors: sitting  some pain medication pain at best 3/10  PRECAUTIONS: None  WEIGHT BEARING RESTRICTIONS No  FALLS:  Has patient fallen in last 6 months? No  LIVING ENVIRONMENT: Lives with: lives with their family Lives in: House/apartment Stairs: No  5 steps into the home Has following equipment at home: None  OCCUPATION: works at [employer], has to be on feet 8 hours a day, does some lifting  PLOF: Independent  started having right knee pain in 2017, was able to make it with injections  PATIENT GOALS have less pain and bend the knee better   OBJECTIVE:   DIAGNOSTIC FINDINGS: x-rays negative  PATIENT SURVEYS:  FOTO 43  COGNITION:  Overall cognitive status: Within functional limits for tasks assessed     SENSATION: Reports that the right knee is numb anterior  EDEMA:  Mild  edema  MUSCLE LENGTH: Tight HS and calves  PALPATION: Slight warmth to touch anterior right knee , mild tenderness  LOWER EXTREMITY ROM:  ActivePassive ROM Right AROM eval Left PROM eval  Hip flexion    Hip extension    Hip abduction    Hip adduction    Hip internal rotation    Hip external rotation    Knee flexion 83 90  Knee extension 15 8  Ankle dorsiflexion    Ankle plantarflexion    Ankle inversion    Ankle eversion     (Blank rows = not tested)  LOWER EXTREMITY MMT:  MMT  Right eval Left eval  Hip flexion    Hip extension    Hip abduction    Hip adduction    Hip internal rotation    Hip external rotation    Knee flexion 4 4+  Knee extension 4 4+  Ankle dorsiflexion    Ankle plantarflexion    Ankle inversion    Ankle eversion     (Blank rows = not tested)  FUNCTIONAL TESTS:  Timed up and go (TUG): 14  GAIT: Distance walked: 100' Assistive device utilized: None Level of assistance: Complete Independence Comments: decreased right knee extension, mild antalgic on the right                     TODAY'S TREATMENT:   04/29/22   NuStep-L4 x 6 minutes.   HS curl 20#, 2 x 10 reps   Knee ext 20#, 2 x 10 reps   Supine patellar mobs F/B AA knee ext ROM with foot elevated on wedge.   Seated patellar mobs into depression F/B contract relax and P knee flexion ROM   PROM- 9-95   Standing TKE against Green Tband 2 x 10 reps.   Standing knee stretch at steps, 5 x 10 sec   Step ups on 8" step forward, side step, crossover, 10 each, no UE support.                        8/21                      TherEx:                      -Bike L0 x6 minutes seat 7 for flexion ROM                      - knee flexion stretch on steps 10x10 second holds                      - bridges with red TB x15                      - hip sidelying ABD 1x10 B red TB                       - prone hip extensions red TB 1x10                     - walking bridges 1x10                      -forward and lateral step ups 6 inch step x15 B                    -  eccentric step downs 1x10 B 4 inch step                      - hip hikes with and without swing 1x10 B                       Manual                     - knee flexion stretching/ROM- only to 78-80 degrees today, very stiff                     - patella mobs all directions grade III   8/17 Nustep L4 x34mns Bike L2 x540ms (seat 9)  PROM flex/ext  Step ups 6" Knee ext 20# 2x10, 5# RLE x10  HS curls 20# 2x10, 10# RLE x10 Prone  quad stretch 2x30s RLE  Bridges 2x10    PATIENT EDUCATION:  Education details: POC and low load long duration flexion stretch Person educated: Patient Education method: ExConsulting civil engineerDemonstration, Tactile cues, Verbal cues, and Handouts Education comprehension: verbalized understanding, returned demonstration, and verbal cues required   HOME EXERCISE PROGRAM: As above  ASSESSMENT:  CLINICAL IMPRESSION: Patient reports no problems. Treatment focused on R knee ROM with mild improvememtn noted. Reviewed home stretching and encouraged her to increase the frequency.  OBJECTIVE IMPAIRMENTS Abnormal gait, decreased activity tolerance, decreased balance, decreased mobility, difficulty walking, decreased ROM, decreased strength, increased edema, impaired flexibility, impaired tone, and pain.   REHAB POTENTIAL: Good  CLINICAL DECISION MAKING: Stable/uncomplicated  EVALUATION COMPLEXITY: Low   GOALS: Goals reviewed with patient? Yes  SHORT TERM GOALS: Target date: 04/22/22 Independent with initial HEP Goal status: ongoing  LONG TERM GOALS: Target date: 07/01/22  Independent with advanced HEP Goal status: INITIAL  2.  Decrease pain overall by 50% Goal status: ongoing  3.  Increase PROM to 110 degrees flexion Goal status: ongoing  4.  Increase AROM of the knee flexion to 105 degrees Goal status:ongoing  PLAN: PT FREQUENCY: 1-2x/week  PT DURATION: 12 weeks  PLANNED INTERVENTIONS: Therapeutic exercises, Therapeutic activity, Neuromuscular re-education, Balance training, Gait training, Patient/Family education, Self Care, Joint mobilization, Stair training, Dry Needling, Electrical stimulation, Cryotherapy, Moist heat, Taping, Vasopneumatic device, and Manual therapy  PLAN FOR NEXT SESSION: work on the flexion, STS, leg press, MET    SuEthel RanaPT 04/29/22 5:57 PM  04/29/2022, 5:57 PM

## 2022-05-01 NOTE — Progress Notes (Unsigned)
   I, Peterson Lombard, LAT, ATC acting as a scribe for Lynne Leader, MD.  [name] [name] is a 66 y.o. female who presents to [place] at [facility] [place] today for f/u chronic R knee pain. She has a hx of a R TKA w/ Dr. Lyla Glassing on 08/27/21 and a R knee MUA on 11/20/21.  She works at [employer] and has to stand the majority of the time. Pt was last seen by Dr. Georgina Snell on 03/23/22 and was advised to use a knee compression sleeve and was referred for additional PT, completing 4 visits. Today, pt reports R knee is feeling about the same.  She does not think that she has had enough physical therapy to really tell yet.  Dx imaging: 03/23/22 R knee XR  11/20/21 R knee XR  08/27/21 R knee XR  02/05/17 R knee MRI  11/21/16 R knee XR  Pertinent review of systems: No fevers or chills  Relevant historical information: History of knee replacement.  Diabetes.   Exam:  BP 124/72   Pulse 81   Ht '5\' 1"'$  (1.549 m)   Wt 151 lb 6.4 oz (68.7 kg)   SpO2 97%   BMI 28.61 kg/m  General: Well Developed, well nourished, and in no acute distress.   MSK: Right knee mature scar anterior knee.  Decreased range of motion.  Lacks full extension by few degrees.  Flexion limited to about 95 degrees.    Lab and Radiology Results  EXAM: RIGHT KNEE 3 VIEWS   COMPARISON:  Right knee radiographs 11/20/2021   FINDINGS: Status post total right knee arthroplasty. No perihardware lucency is seen to indicate hardware failure or loosening. Small knee joint effusion. No acute fracture or dislocation.   IMPRESSION: Status post total right knee arthroplasty without evidence of hardware failure.     Electronically Signed   By: Yvonne Kendall M.D.   On: 03/23/2022 17:34 I, Lynne Leader, personally (independently) visualized and performed the interpretation of the images attached in this note.      Assessment and Plan: 66 y.o. female with continued right knee discomfort and stiffness.  Patient will benefit from  further physical therapy.  I do not think she has had a full complete course of PT yet.  We will give it another month.  If not better next step would likely be three-phase bone scan. Recheck in 1 month.    Discussed warning signs or symptoms. Please see discharge instructions. Patient expresses understanding.   The above documentation has been reviewed and is accurate and complete Lynne Leader, M.D.

## 2022-05-04 ENCOUNTER — Ambulatory Visit (INDEPENDENT_AMBULATORY_CARE_PROVIDER_SITE_OTHER): Payer: 59 | Admitting: Family Medicine

## 2022-05-04 ENCOUNTER — Ambulatory Visit: Payer: 59 | Admitting: Physical Therapy

## 2022-05-04 ENCOUNTER — Encounter: Payer: Self-pay | Admitting: Physical Therapy

## 2022-05-04 VITALS — BP 124/72 | HR 81 | Ht 61.0 in | Wt 151.4 lb

## 2022-05-04 DIAGNOSIS — G8929 Other chronic pain: Secondary | ICD-10-CM

## 2022-05-04 DIAGNOSIS — M25561 Pain in right knee: Secondary | ICD-10-CM

## 2022-05-04 DIAGNOSIS — R262 Difficulty in walking, not elsewhere classified: Secondary | ICD-10-CM

## 2022-05-04 DIAGNOSIS — M25661 Stiffness of right knee, not elsewhere classified: Secondary | ICD-10-CM

## 2022-05-04 NOTE — Therapy (Signed)
OUTPATIENT PHYSICAL THERAPY LOWER EXTREMITY TREATMENT   Patient Name: [name] [name] MRN: [id number] DOB:February 20, 1956, 66 y.o., female Today's Date: 05/04/2022   PT End of Session - 05/04/22 0755     Visit Number 5    Date for PT Re-Evaluation 07/09/22    PT Start Time 0756    PT Stop Time 0843    PT Time Calculation (min) 47 min    Activity Tolerance Patient tolerated treatment well    Behavior During Therapy [facility] for tasks assessed/performed               Past Medical History:  Diagnosis Date   Arthritis    Complication of anesthesia    Diabetes mellitus without complication (HCC)    PONV (postoperative nausea and vomiting)    Ruptured ovarian cyst    Past Surgical History:  Procedure Laterality Date   CESAREAN SECTION     COLONOSCOPY  06/05/2019   per Dr. Tarri Glenn, single adenomatous polyp, repeat in 7 yrs   KNEE ARTHROPLASTY Right 08/27/2021   Procedure: COMPUTER ASSISTED TOTAL KNEE ARTHROPLASTY;  Surgeon: Rod Can, MD;  Location: [facility];  Service: Orthopedics;  Laterality: Right;   KNEE CLOSED REDUCTION Right 11/20/2021   Procedure: CLOSED MANIPULATION KNEE;  Surgeon: Rod Can, MD;  Location: [facility];  Service: Orthopedics;  Laterality: Right;   lumpectomy,benign, right breast     x2   RIGHT SHOULDER SURGERY      Patient Active Problem List   Diagnosis Date Noted   Primary hypertension 03/09/2022   Osteoarthritis of right knee 08/27/2021   S/P total knee arthroplasty, 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   COVID-19 virus infection 04/09/2021   Dyslipidemia 03/31/2021   Urinary frequency 10/29/2017   Diabetes mellitus without complication ([place]) 92/42/6834   Special screening for malignant neoplasms, colon 11/25/2011   Diverticulosis of colon (without mention of hemorrhage) 11/25/2011   URTICARIA 01/31/2010   ECZEMA 05/03/2009    PCP: Sarajane Jews  REFERRING PROVIDER: Lonna Cobb DIAG: right knee pain  THERAPY DIAG:  Stiffness of right knee, not elsewhere  classified  Acute pain of right knee  Difficulty in walking, not elsewhere classified  Rationale for Evaluation and Treatment Rehabilitation  ONSET DATE: 08/27/21  SUBJECTIVE:   SUBJECTIVE STATEMENT: "The Same "  PERTINENT HISTORY: Right TKA 08/27/21, Manipulation 11/20/21  PAIN:  Are you having pain? Yes: NPRS scale: 2/10 Pain location: right knee  Pain description: difficult to describe  Aggravating factors: bending the knee, working, standing, pain up to 8/10 Relieving factors: sitting  some pain medication pain at best 3/10  PRECAUTIONS: None  WEIGHT BEARING RESTRICTIONS No  FALLS:  Has patient fallen in last 6 months? No  LIVING ENVIRONMENT: Lives with: lives with their family Lives in: House/apartment Stairs: No  5 steps into the home Has following equipment at home: None  OCCUPATION: works at [employer], has to be on feet 8 hours a day, does some lifting  PLOF: Independent  started having right knee pain in 2017, was able to make it with injections  PATIENT GOALS have less pain and bend the knee better   OBJECTIVE:   DIAGNOSTIC FINDINGS: x-rays negative  PATIENT SURVEYS:  FOTO 43  COGNITION:  Overall cognitive status: Within functional limits for tasks assessed     SENSATION: Reports that the right knee is numb anterior  EDEMA:  Mild edema  MUSCLE LENGTH: Tight HS and calves  PALPATION: Slight warmth to touch anterior right knee , mild tenderness  LOWER EXTREMITY ROM:  ActivePassive  ROM Right AROM eval Left PROM eval  Hip flexion    Hip extension    Hip abduction    Hip adduction    Hip internal rotation    Hip external rotation    Knee flexion 83 90  Knee extension 15 8  Ankle dorsiflexion    Ankle plantarflexion    Ankle inversion    Ankle eversion     (Blank rows = not tested)  LOWER EXTREMITY MMT:  MMT Right eval Left eval  Hip flexion    Hip extension    Hip abduction    Hip adduction    Hip internal rotation     Hip external rotation    Knee flexion 4 4+  Knee extension 4 4+  Ankle dorsiflexion    Ankle plantarflexion    Ankle inversion    Ankle eversion     (Blank rows = not tested)  FUNCTIONAL TESTS:  Timed up and go (TUG): 14  GAIT: Distance walked: 100' Assistive device utilized: None Level of assistance: Complete Independence Comments: decreased right knee extension, mild antalgic on the right                     TODAY'S TREATMENT:   05/04/22   NuStep L3 x6 min LE only    R knee PROM, Contract relax for flexion   Leg press 20lb 2x10 progressive lowering, RLE no weight x5    Sit to stands holding yellow ball 2x10    Step ups 6in x10 each    Hamstring Curls RLE 15lb 2x10   Leg ext lb 2x10    04/29/22   NuStep-L4 x 6 minutes.   HS curl 20#, 2 x 10 reps   Knee ext 20#, 2 x 10 reps   Supine patellar mobs F/B AA knee ext ROM with foot elevated on wedge.   Seated patellar mobs into depression F/B contract relax and P knee flexion ROM   PROM- 9-95   Standing TKE against Green Tband 2 x 10 reps.   Standing knee stretch at steps, 5 x 10 sec   Step ups on 8" step forward, side step, crossover, 10 each, no UE support.                        8/21                      TherEx:                      -Bike L0 x6 minutes seat 7 for flexion ROM                      - knee flexion stretch on steps 10x10 second holds                      - bridges with red TB x15                      - hip sidelying ABD 1x10 B red TB                       - prone hip extensions red TB 1x10                     - walking bridges 1x10                      -  forward and lateral step ups 6 inch step x15 B                    - eccentric step downs 1x10 B 4 inch step                      - hip hikes with and without swing 1x10 B                       Manual                     - knee flexion stretching/ROM- only to 78-80 degrees today, very stiff                     - patella mobs all directions grade III    8/17 Nustep L4 x68mns Bike L2 x552ms (seat 9)  PROM flex/ext  Step ups 6" Knee ext 20# 2x10, 5# RLE x10  HS curls 20# 2x10, 10# RLE x10 Prone quad stretch 2x30s RLE  Bridges 2x10    PATIENT EDUCATION:  Education details: POC and low load long duration flexion stretch Person educated: Patient Education method: ExConsulting civil engineerDemonstration, Tactile cues, Verbal cues, and Handouts Education comprehension: verbalized understanding, returned demonstration, and verbal cues required   HOME EXERCISE PROGRAM: As above  ASSESSMENT:  CLINICAL IMPRESSION: Patient reports no problems. Continued  focused on R knee ROM with mild improvement noted after come contract relax. Progressing lowering utilized on leg press to challenge pt to achieve more flexion. Good effort given throughout session.  OBJECTIVE IMPAIRMENTS Abnormal gait, decreased activity tolerance, decreased balance, decreased mobility, difficulty walking, decreased ROM, decreased strength, increased edema, impaired flexibility, impaired tone, and pain.   REHAB POTENTIAL: Good  CLINICAL DECISION MAKING: Stable/uncomplicated  EVALUATION COMPLEXITY: Low   GOALS: Goals reviewed with patient? Yes  SHORT TERM GOALS: Target date: 04/22/22 Independent with initial HEP Goal status: progressing   LONG TERM GOALS: Target date: 07/01/22  Independent with advanced HEP Goal status: INITIAL  2.  Decrease pain overall by 50% Goal status: ongoing  3.  Increase PROM to 110 degrees flexion Goal status: ongoing  4.  Increase AROM of the knee flexion to 105 degrees Goal status:ongoing  PLAN: PT FREQUENCY: 1-2x/week  PT DURATION: 12 weeks  PLANNED INTERVENTIONS: Therapeutic exercises, Therapeutic activity, Neuromuscular re-education, Balance training, Gait training, Patient/Family education, Self Care, Joint mobilization, Stair training, Dry Needling, Electrical stimulation, Cryotherapy, Moist heat, Taping, Vasopneumatic device,  and Manual therapy  PLAN FOR NEXT SESSION: work on the flexion, STS, leg press, MET    SuEthel RanaPT 05/04/22 7:56 AM  05/04/2022, 7:56 AM

## 2022-05-04 NOTE — Patient Instructions (Signed)
Thank you for coming in today.   Continue PT.   Recheck in 1 month.   IF not better we will do a 3 phase bone scan.

## 2022-05-06 ENCOUNTER — Encounter: Payer: Self-pay | Admitting: Physical Therapy

## 2022-05-06 ENCOUNTER — Ambulatory Visit: Payer: 59 | Admitting: Physical Therapy

## 2022-05-06 DIAGNOSIS — M25661 Stiffness of right knee, not elsewhere classified: Secondary | ICD-10-CM

## 2022-05-06 DIAGNOSIS — M25561 Pain in right knee: Secondary | ICD-10-CM

## 2022-05-06 DIAGNOSIS — R262 Difficulty in walking, not elsewhere classified: Secondary | ICD-10-CM

## 2022-05-06 NOTE — Therapy (Signed)
OUTPATIENT PHYSICAL THERAPY LOWER EXTREMITY TREATMENT   Patient Name: [name] [name] MRN: [id number] DOB:April 07, 1956, 66 y.o., female Today's Date: 05/06/2022   PT End of Session - 05/06/22 1755     Visit Number 6    Date for PT Re-Evaluation 07/09/22    PT Start Time 1714    PT Stop Time 1753    PT Time Calculation (min) 39 min    Activity Tolerance Patient tolerated treatment well    Behavior During Therapy WFL for tasks assessed/performed                Past Medical History:  Diagnosis Date   Arthritis    Complication of anesthesia    Diabetes mellitus without complication (HCC)    PONV (postoperative nausea and vomiting)    Ruptured ovarian cyst    Past Surgical History:  Procedure Laterality Date   CESAREAN SECTION     COLONOSCOPY  06/05/2019   per Dr. Tarri Glenn, single adenomatous polyp, repeat in 7 yrs   KNEE ARTHROPLASTY Right 08/27/2021   Procedure: COMPUTER ASSISTED TOTAL KNEE ARTHROPLASTY;  Surgeon: Rod Can, MD;  Location: [facility];  Service: Orthopedics;  Laterality: Right;   KNEE CLOSED REDUCTION Right 11/20/2021   Procedure: CLOSED MANIPULATION KNEE;  Surgeon: Rod Can, MD;  Location: [facility];  Service: Orthopedics;  Laterality: Right;   lumpectomy,benign, right breast     x2   RIGHT SHOULDER SURGERY      Patient Active Problem List   Diagnosis Date Noted   Primary hypertension 03/09/2022   Osteoarthritis of right knee 08/27/2021   S/P total knee arthroplasty, 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   COVID-19 virus infection 04/09/2021   Dyslipidemia 03/31/2021   Urinary frequency 10/29/2017   Diabetes mellitus without complication ([place]) 91/63/8466   Special screening for malignant neoplasms, colon 11/25/2011   Diverticulosis of colon (without mention of hemorrhage) 11/25/2011   URTICARIA 01/31/2010   ECZEMA 05/03/2009    PCP: Sarajane Jews  REFERRING PROVIDER: Lonna Cobb DIAG: right knee pain  THERAPY DIAG:  Stiffness of right knee, not elsewhere  classified  Acute pain of right knee  Difficulty in walking, not elsewhere classified  Rationale for Evaluation and Treatment Rehabilitation  ONSET DATE: 08/27/21  SUBJECTIVE:   SUBJECTIVE STATEMENT: Patient reports no real changes  She saw the Dr who sent a message to continue PT  PERTINENT HISTORY: Right TKA 08/27/21, Manipulation 11/20/21  PAIN:  Are you having pain? Yes: NPRS scale: 2/10 Pain location: right knee  Pain description: difficult to describe  Aggravating factors: bending the knee, working, standing, pain up to 8/10 Relieving factors: sitting  some pain medication pain at best 3/10  PRECAUTIONS: None  WEIGHT BEARING RESTRICTIONS No  FALLS:  Has patient fallen in last 6 months? No  LIVING ENVIRONMENT: Lives with: lives with their family Lives in: House/apartment Stairs: No  5 steps into the home Has following equipment at home: None  OCCUPATION: works at [employer], has to be on feet 8 hours a day, does some lifting  PLOF: Independent  started having right knee pain in 2017, was able to make it with injections  PATIENT GOALS have less pain and bend the knee better   OBJECTIVE:   DIAGNOSTIC FINDINGS: x-rays negative  PATIENT SURVEYS:  FOTO 43  COGNITION:  Overall cognitive status: Within functional limits for tasks assessed     SENSATION: Reports that the right knee is numb anterior  EDEMA:  Mild edema  MUSCLE LENGTH: Tight HS and calves  PALPATION: Slight  warmth to touch anterior right knee , mild tenderness  LOWER EXTREMITY ROM:  ActivePassive ROM Right AROM eval Left PROM eval  Hip flexion    Hip extension    Hip abduction    Hip adduction    Hip internal rotation    Hip external rotation    Knee flexion 83 90  Knee extension 15 8  Ankle dorsiflexion    Ankle plantarflexion    Ankle inversion    Ankle eversion     (Blank rows = not tested)  LOWER EXTREMITY MMT:  MMT Right eval Left eval  Hip flexion    Hip  extension    Hip abduction    Hip adduction    Hip internal rotation    Hip external rotation    Knee flexion 4 4+  Knee extension 4 4+  Ankle dorsiflexion    Ankle plantarflexion    Ankle inversion    Ankle eversion     (Blank rows = not tested)  FUNCTIONAL TESTS:  Timed up and go (TUG): 14  GAIT: Distance walked: 100' Assistive device utilized: None Level of assistance: Complete Independence Comments: decreased right knee extension, mild antalgic on the right                     TODAY'S TREATMENT: 05/06/22 NuStep L5 x 6 min, LE only HS curls 15# RLE 2 x 10 reps Knee ext B 10#, 2 x 10 reps Step ups, forward, side, crossover, 2 x 10 each with RLE, 6" step STM to quads, HS, ITB, calves, Quad stretch in supine, patellar mobs in all directions, grade III, P knee ext stretch, passive, AA, and contract relax stretching for knee flexion. PROM 6-98   05/04/22   NuStep L3 x6 min LE only    R knee PROM, Contract relax for flexion   Leg press 20lb 2x10 progressive lowering, RLE no weight x5    Sit to stands holding yellow ball 2x10    Step ups 6in x10 each    Hamstring Curls RLE 15lb 2x10   Leg ext lb 2x10    04/29/22   NuStep-L4 x 6 minutes.   HS curl 20#, 2 x 10 reps   Knee ext 20#, 2 x 10 reps   Supine patellar mobs F/B AA knee ext ROM with foot elevated on wedge.   Seated patellar mobs into depression F/B contract relax and P knee flexion ROM   PROM- 9-95   Standing TKE against Green Tband 2 x 10 reps.   Standing knee stretch at steps, 5 x 10 sec   Step ups on 8" step forward, side step, crossover, 10 each, no UE support.                        8/21                      TherEx:                      -Bike L0 x6 minutes seat 7 for flexion ROM                      - knee flexion stretch on steps 10x10 second holds                      - bridges with red TB x15                      -  hip sidelying ABD 1x10 B red TB                       - prone hip extensions red TB  1x10                     - walking bridges 1x10                      -forward and lateral step ups 6 inch step x15 B                    - eccentric step downs 1x10 B 4 inch step                      - hip hikes with and without swing 1x10 B                       Manual                     - knee flexion stretching/ROM- only to 78-80 degrees today, very stiff                     - patella mobs all directions grade III   8/17 Nustep L4 x104mns Bike L2 x54ms (seat 9)  PROM flex/ext  Step ups 6" Knee ext 20# 2x10, 5# RLE x10  HS curls 20# 2x10, 10# RLE x10 Prone quad stretch 2x30s RLE  Bridges 2x10    PATIENT EDUCATION:  Education details: POC and low load long duration flexion stretch Person educated: Patient Education method: ExConsulting civil engineerDemonstration, Tactile cues, Verbal cues, and Handouts Education comprehension: verbalized understanding, returned demonstration, and verbal cues required   HOME EXERCISE PROGRAM: As above  ASSESSMENT:  CLINICAL IMPRESSION: Patient reports no problems. Performed long warm up and strengthening for HS and quad F/B STM to same and patellar mobs. Then stretch into flex and ext. PROM 6-98  OBJECTIVE IMPAIRMENTS Abnormal gait, decreased activity tolerance, decreased balance, decreased mobility, difficulty walking, decreased ROM, decreased strength, increased edema, impaired flexibility, impaired tone, and pain.   REHAB POTENTIAL: Good  CLINICAL DECISION MAKING: Stable/uncomplicated  EVALUATION COMPLEXITY: Low   GOALS: Goals reviewed with patient? Yes  SHORT TERM GOALS: Target date: 04/22/22 Independent with initial HEP Goal status: progressing   LONG TERM GOALS: Target date: 07/01/22  Independent with advanced HEP Goal status: INITIAL  2.  Decrease pain overall by 50% Goal status: ongoing  3.  Increase PROM to 110 degrees flexion Goal status: ongoing  4.  Increase AROM of the knee flexion to 105 degrees Goal  status:ongoing  PLAN: PT FREQUENCY: 1-2x/week  PT DURATION: 12 weeks  PLANNED INTERVENTIONS: Therapeutic exercises, Therapeutic activity, Neuromuscular re-education, Balance training, Gait training, Patient/Family education, Self Care, Joint mobilization, Stair training, Dry Needling, Electrical stimulation, Cryotherapy, Moist heat, Taping, Vasopneumatic device, and Manual therapy  PLAN FOR NEXT SESSION: work on the flexion, STS, leg press, MET    SuEthel RanaPT 05/06/22 5:56 PM  05/06/2022, 5:56 PM

## 2022-05-14 NOTE — Therapy (Signed)
OUTPATIENT PHYSICAL THERAPY LOWER EXTREMITY TREATMENT   Patient Name: [name] [name] MRN: [id number] DOB:07/25/1956, 66 y.o., female Today's Date: 05/14/2022        Past Medical History:  Diagnosis Date   Arthritis    Complication of anesthesia    Diabetes mellitus without complication (HCC)    PONV (postoperative nausea and vomiting)    Ruptured ovarian cyst    Past Surgical History:  Procedure Laterality Date   CESAREAN SECTION     COLONOSCOPY  06/05/2019   per Dr. Tarri Glenn, single adenomatous polyp, repeat in 7 yrs   KNEE ARTHROPLASTY Right 08/27/2021   Procedure: COMPUTER ASSISTED TOTAL KNEE ARTHROPLASTY;  Surgeon: Rod Can, MD;  Location: [facility];  Service: Orthopedics;  Laterality: Right;   KNEE CLOSED REDUCTION Right 11/20/2021   Procedure: CLOSED MANIPULATION KNEE;  Surgeon: Rod Can, MD;  Location: [facility];  Service: Orthopedics;  Laterality: Right;   lumpectomy,benign, right breast     x2   RIGHT SHOULDER SURGERY      Patient Active Problem List   Diagnosis Date Noted   Primary hypertension 03/09/2022   Osteoarthritis of right knee 08/27/2021   S/P total knee arthroplasty, 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   COVID-19 virus infection 04/09/2021   Dyslipidemia 03/31/2021   Urinary frequency 10/29/2017   Diabetes mellitus without complication ([place]) 35/39/1225   Special screening for malignant neoplasms, colon 11/25/2011   Diverticulosis of colon (without mention of hemorrhage) 11/25/2011   URTICARIA 01/31/2010   ECZEMA 05/03/2009    PCP: Sarajane Jews  REFERRING PROVIDER: Lonna Cobb DIAG: right knee pain  THERAPY DIAG:  No diagnosis found.  Rationale for Evaluation and Treatment Rehabilitation  ONSET DATE: 08/27/21  SUBJECTIVE:   SUBJECTIVE STATEMENT: Patient reports no real changes  She saw the Dr who sent a message to continue PT  PERTINENT HISTORY: Right TKA 08/27/21, Manipulation 11/20/21  PAIN:  Are you having pain? Yes: NPRS scale: 2/10 Pain  location: right knee  Pain description: difficult to describe  Aggravating factors: bending the knee, working, standing, pain up to 8/10 Relieving factors: sitting  some pain medication pain at best 3/10  PRECAUTIONS: None  WEIGHT BEARING RESTRICTIONS No  FALLS:  Has patient fallen in last 6 months? No  LIVING ENVIRONMENT: Lives with: lives with their family Lives in: House/apartment Stairs: No  5 steps into the home Has following equipment at home: None  OCCUPATION: works at [employer], has to be on feet 8 hours a day, does some lifting  PLOF: Independent  started having right knee pain in 2017, was able to make it with injections  PATIENT GOALS have less pain and bend the knee better   OBJECTIVE:   DIAGNOSTIC FINDINGS: x-rays negative  PATIENT SURVEYS:  FOTO 43  COGNITION:  Overall cognitive status: Within functional limits for tasks assessed     SENSATION: Reports that the right knee is numb anterior  EDEMA:  Mild edema  MUSCLE LENGTH: Tight HS and calves  PALPATION: Slight warmth to touch anterior right knee , mild tenderness  LOWER EXTREMITY ROM:  ActivePassive ROM Right AROM eval Left PROM eval  Hip flexion    Hip extension    Hip abduction    Hip adduction    Hip internal rotation    Hip external rotation    Knee flexion 83 90  Knee extension 15 8  Ankle dorsiflexion    Ankle plantarflexion    Ankle inversion    Ankle eversion     (Blank rows = not tested)  LOWER EXTREMITY MMT:  MMT Right eval Left eval  Hip flexion    Hip extension    Hip abduction    Hip adduction    Hip internal rotation    Hip external rotation    Knee flexion 4 4+  Knee extension 4 4+  Ankle dorsiflexion    Ankle plantarflexion    Ankle inversion    Ankle eversion     (Blank rows = not tested)  FUNCTIONAL TESTS:  Timed up and go (TUG): 14  GAIT: Distance walked: 100' Assistive device utilized: None Level of assistance: Complete  Independence Comments: decreased right knee extension, mild antalgic on the right                     TODAY'S TREATMENT: 05/18/22 Nustep  HS curls Knee ext Anterior heel taps  Lateral heel taps  MET to quads then pushing into flexion  05/06/22 NuStep L5 x 6 min, LE only HS curls 15# RLE 2 x 10 reps Knee ext B 10#, 2 x 10 reps Step ups, forward, side, crossover, 2 x 10 each with RLE, 6" step STM to quads, HS, ITB, calves, Quad stretch in supine, patellar mobs in all directions, grade III, P knee ext stretch, passive, AA, and contract relax stretching for knee flexion. PROM 6-98   05/04/22   NuStep L3 x6 min LE only    R knee PROM, Contract relax for flexion   Leg press 20lb 2x10 progressive lowering, RLE no weight x5    Sit to stands holding yellow ball 2x10    Step ups 6in x10 each    Hamstring Curls RLE 15lb 2x10   Leg ext lb 2x10    04/29/22   NuStep-L4 x 6 minutes.   HS curl 20#, 2 x 10 reps   Knee ext 20#, 2 x 10 reps   Supine patellar mobs F/B AA knee ext ROM with foot elevated on wedge.   Seated patellar mobs into depression F/B contract relax and P knee flexion ROM   PROM- 9-95   Standing TKE against Green Tband 2 x 10 reps.   Standing knee stretch at steps, 5 x 10 sec   Step ups on 8" step forward, side step, crossover, 10 each, no UE support.                        8/21                      TherEx:                      -Bike L0 x6 minutes seat 7 for flexion ROM                      - knee flexion stretch on steps 10x10 second holds                      - bridges with red TB x15                      - hip sidelying ABD 1x10 B red TB                       - prone hip extensions red TB 1x10                     -   walking bridges 1x10                      -forward and lateral step ups 6 inch step x15 B                    - eccentric step downs 1x10 B 4 inch step                      - hip hikes with and without swing 1x10 B                       Manual                      - knee flexion stretching/ROM- only to 78-80 degrees today, very stiff                     - patella mobs all directions grade III   8/17 Nustep L4 x77mns Bike L2 x550ms (seat 9)  PROM flex/ext  Step ups 6" Knee ext 20# 2x10, 5# RLE x10  HS curls 20# 2x10, 10# RLE x10 Prone quad stretch 2x30s RLE  Bridges 2x10    PATIENT EDUCATION:  Education details: POC and low load long duration flexion stretch Person educated: Patient Education method: ExConsulting civil engineerDemonstration, Tactile cues, Verbal cues, and Handouts Education comprehension: verbalized understanding, returned demonstration, and verbal cues required   HOME EXERCISE PROGRAM: As above  ASSESSMENT:  CLINICAL IMPRESSION: Patient reports no problems. Performed long warm up and strengthening for HS and quad F/B STM to same and patellar mobs. Then stretch into flex and ext. PROM 6-98  OBJECTIVE IMPAIRMENTS Abnormal gait, decreased activity tolerance, decreased balance, decreased mobility, difficulty walking, decreased ROM, decreased strength, increased edema, impaired flexibility, impaired tone, and pain.   REHAB POTENTIAL: Good  CLINICAL DECISION MAKING: Stable/uncomplicated  EVALUATION COMPLEXITY: Low   GOALS: Goals reviewed with patient? Yes  SHORT TERM GOALS: Target date: 04/22/22 Independent with initial HEP Goal status: progressing   LONG TERM GOALS: Target date: 07/01/22  Independent with advanced HEP Goal status: INITIAL  2.  Decrease pain overall by 50% Goal status: ongoing  3.  Increase PROM to 110 degrees flexion Goal status: ongoing  4.  Increase AROM of the knee flexion to 105 degrees Goal status:ongoing  PLAN: PT FREQUENCY: 1-2x/week  PT DURATION: 12 weeks  PLANNED INTERVENTIONS: Therapeutic exercises, Therapeutic activity, Neuromuscular re-education, Balance training, Gait training, Patient/Family education, Self Care, Joint mobilization, Stair training, Dry Needling, Electrical  stimulation, Cryotherapy, Moist heat, Taping, Vasopneumatic device, and Manual therapy  PLAN FOR NEXT SESSION: work on the flexion, STS, leg press, MET    SuEthel RanaPT 05/14/22 4:35 PM  05/14/2022, 4:35 PM

## 2022-05-18 ENCOUNTER — Ambulatory Visit: Payer: 59 | Attending: Family Medicine

## 2022-05-18 DIAGNOSIS — M25661 Stiffness of right knee, not elsewhere classified: Secondary | ICD-10-CM | POA: Insufficient documentation

## 2022-05-18 DIAGNOSIS — R262 Difficulty in walking, not elsewhere classified: Secondary | ICD-10-CM | POA: Diagnosis present

## 2022-05-18 DIAGNOSIS — M25561 Pain in right knee: Secondary | ICD-10-CM | POA: Diagnosis present

## 2022-05-21 ENCOUNTER — Ambulatory Visit: Payer: 59 | Admitting: Physical Therapy

## 2022-05-21 IMAGING — US US ABDOMEN LIMITED
1 series · 14 of 25 positions shown · non-contrast
Comparison: February 16, 2019.

CLINICAL DATA: Acute right upper quadrant abdominal pain.

EXAM:
ULTRASOUND ABDOMEN LIMITED RIGHT UPPER QUADRANT

[Series 1: us abdomen limited · 0.18mm/px · 14 of 44 slices shown]
[im 1/44]
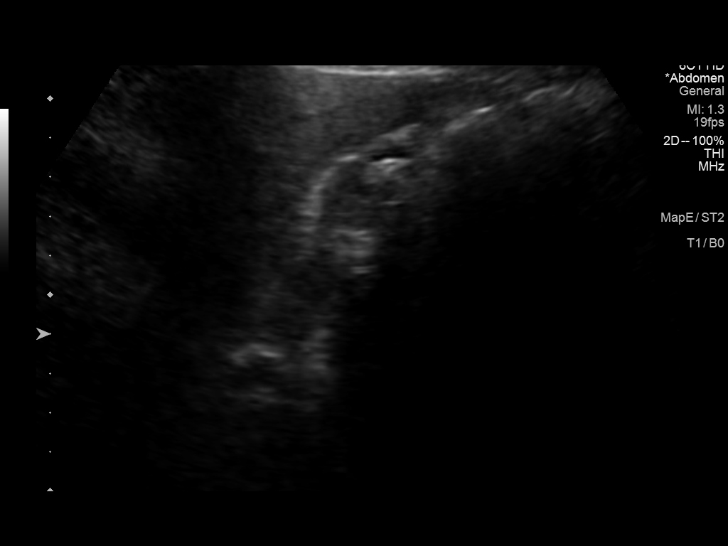
[im 4/44]
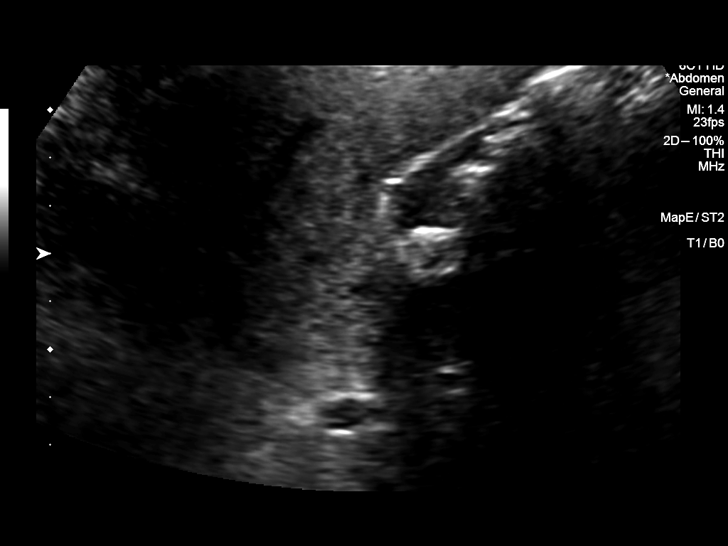
[im 8/44]
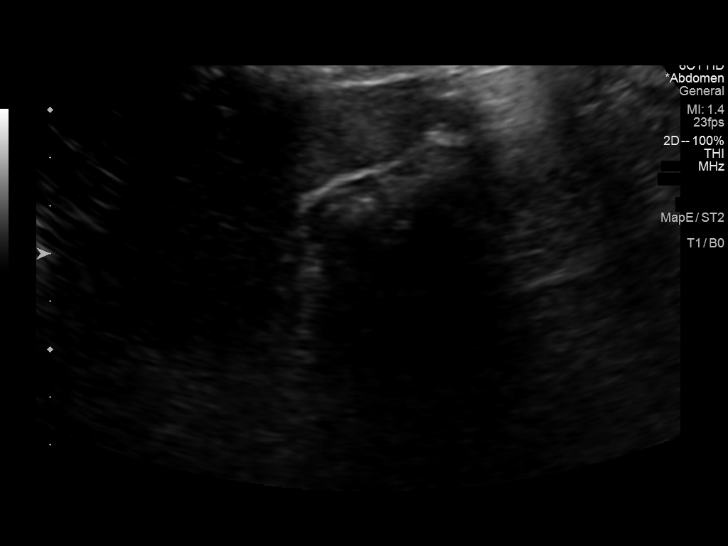
[im 11/44]
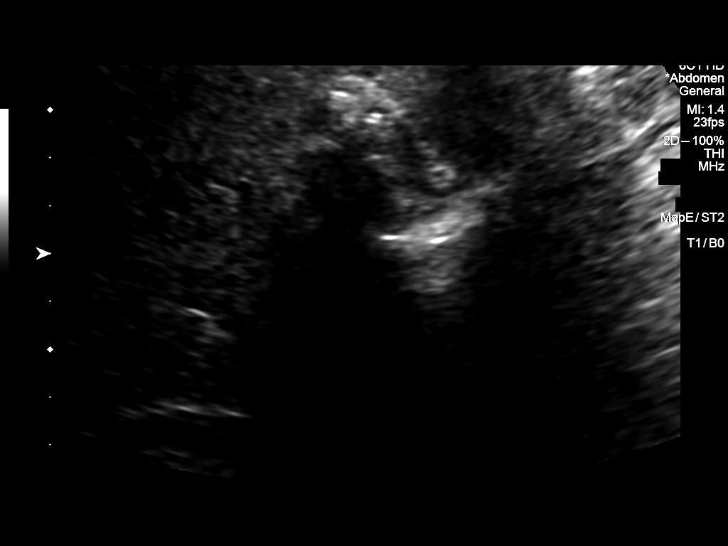
[im 15/44]
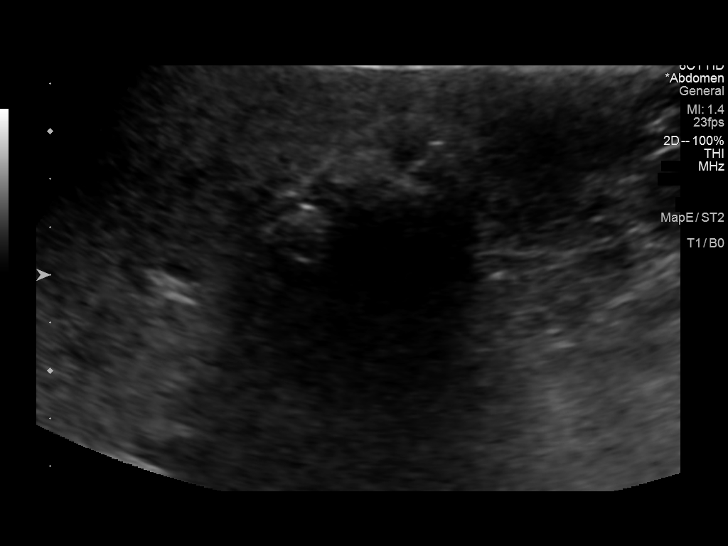
[im 17/44]
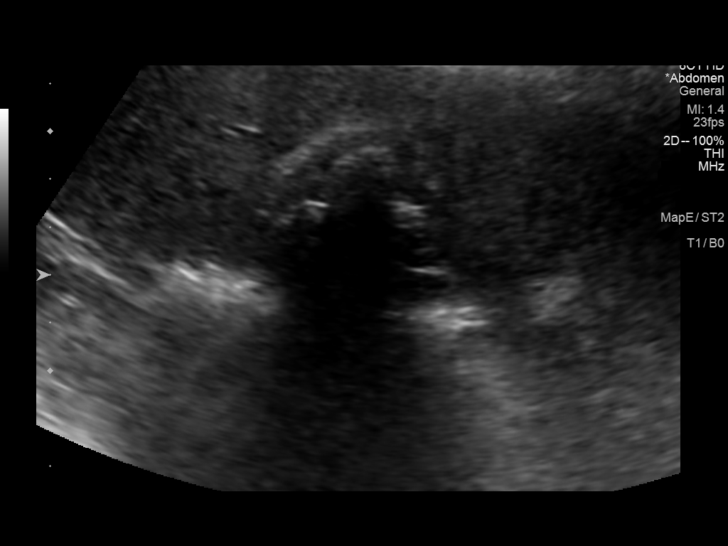
[im 20/44]
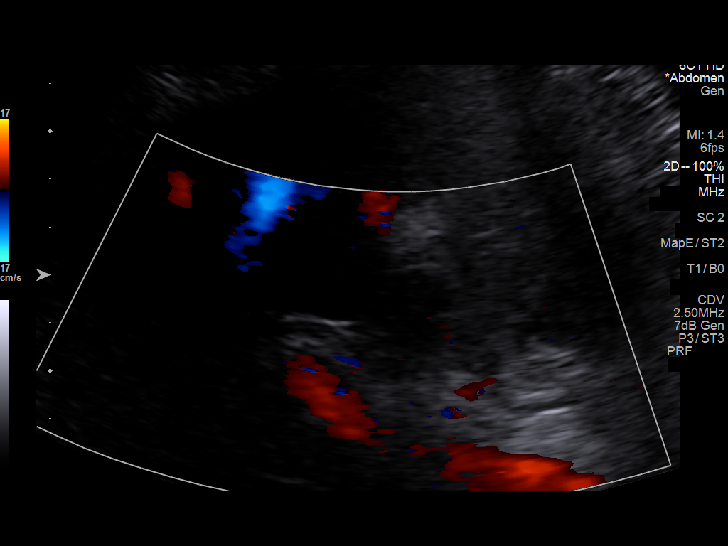
[im 24/44]
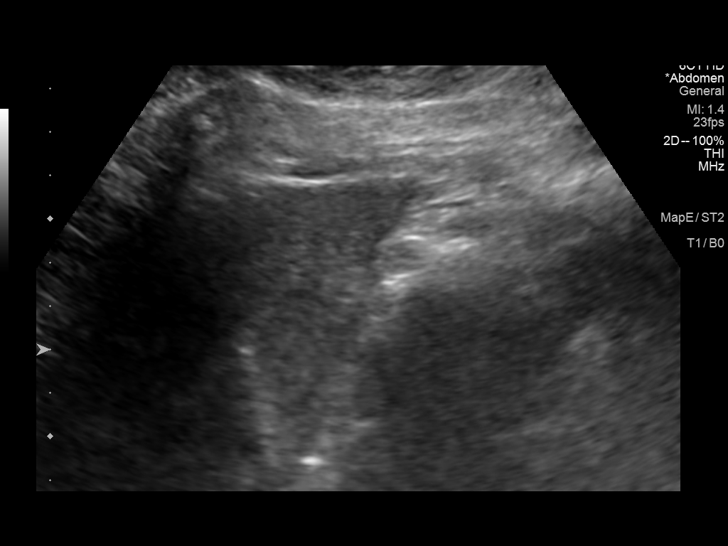
[im 27/44]
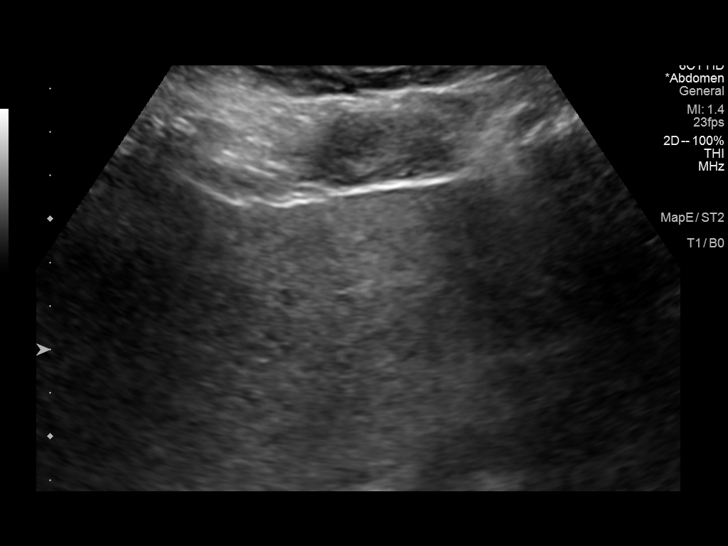
[im 29/44]
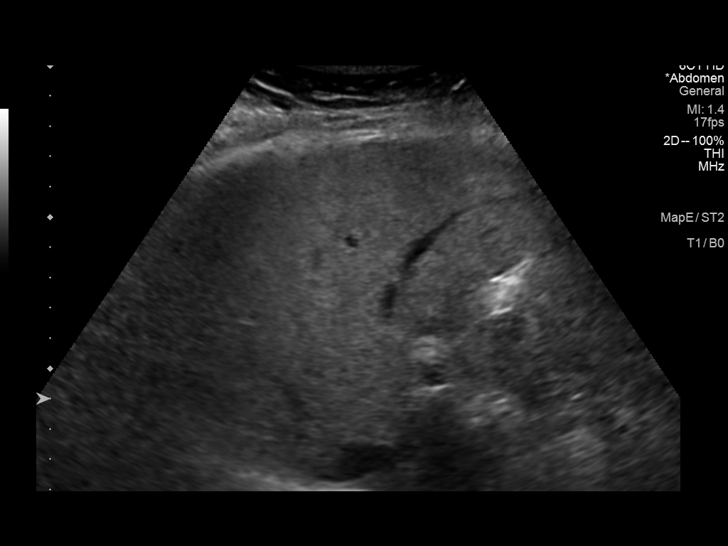
[im 33/44]
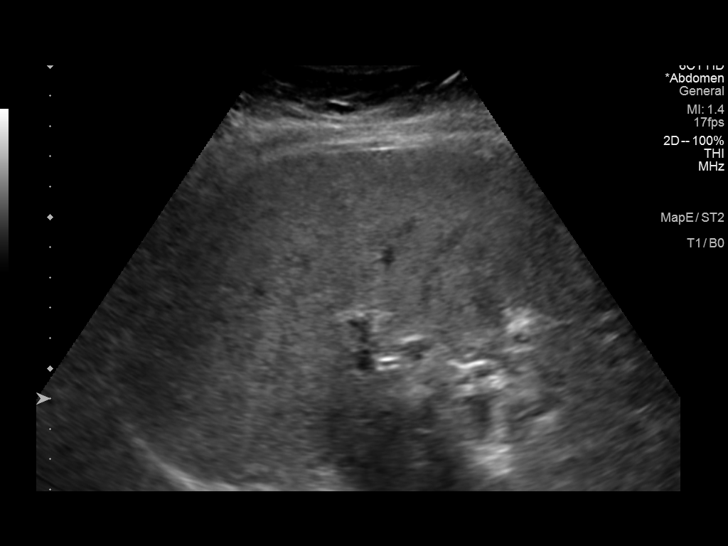
[im 36/44]
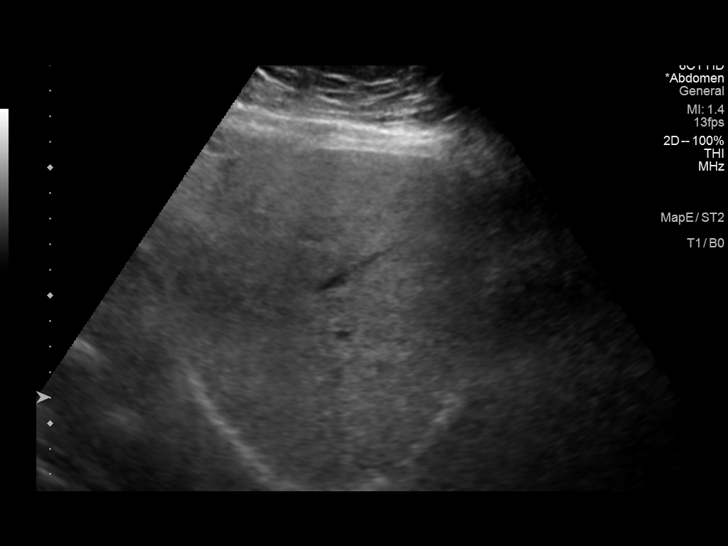
[im 40/44]
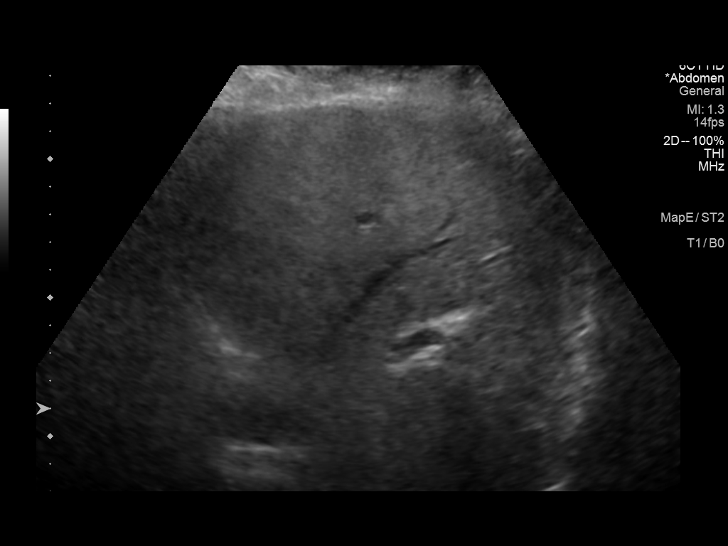
[im 44/44]
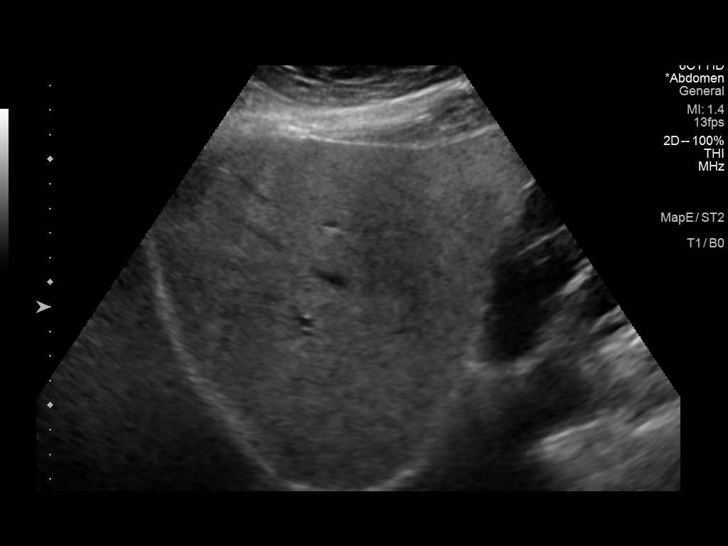

[14 of 25 positions shown; findings below may reference images not displayed]

FINDINGS: Gallbladder:

Cholelithiasis is noted with largest stone measuring 1.3 cm. Some
sludge is present. No gallbladder wall thickening or pericholecystic
fluid is noted. No sonographic Murphy's sign is noted.

Common bile duct:

Diameter: 4 mm which is within normal limits.

Liver:

No focal lesion identified. Increased echogenicity of hepatic
parenchyma is noted suggesting hepatic steatosis. Portal vein is
patent on color Doppler imaging with normal direction of blood flow
towards the liver.

Other: None.
IMPRESSION: Cholelithiasis is noted without evidence of cholecystitis.

Probable hepatic steatosis.

## 2022-05-22 NOTE — Therapy (Signed)
OUTPATIENT PHYSICAL THERAPY LOWER EXTREMITY TREATMENT   Patient Name: [name] [name] MRN: [id number] DOB:10-08-1955, 66 y.o., female Today's Date: 05/25/2022   PT End of Session - 05/25/22 0846     Visit Number 8    Date for PT Re-Evaluation 07/09/22    PT Start Time 0845    PT Stop Time 0926    PT Time Calculation (min) 41 min    Activity Tolerance Patient tolerated treatment well    Behavior During Therapy WFL for tasks assessed/performed                  Past Medical History:  Diagnosis Date   Arthritis    Complication of anesthesia    Diabetes mellitus without complication (HCC)    PONV (postoperative nausea and vomiting)    Ruptured ovarian cyst    Past Surgical History:  Procedure Laterality Date   CESAREAN SECTION     COLONOSCOPY  06/05/2019   per Dr. Tarri Glenn, single adenomatous polyp, repeat in 7 yrs   KNEE ARTHROPLASTY Right 08/27/2021   Procedure: COMPUTER ASSISTED TOTAL KNEE ARTHROPLASTY;  Surgeon: Rod Can, MD;  Location: [facility];  Service: Orthopedics;  Laterality: Right;   KNEE CLOSED REDUCTION Right 11/20/2021   Procedure: CLOSED MANIPULATION KNEE;  Surgeon: Rod Can, MD;  Location: [facility];  Service: Orthopedics;  Laterality: Right;   lumpectomy,benign, right breast     x2   RIGHT SHOULDER SURGERY      Patient Active Problem List   Diagnosis Date Noted   Primary hypertension 03/09/2022   Osteoarthritis of right knee 08/27/2021   S/P total knee arthroplasty, 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   COVID-19 virus infection 04/09/2021   Dyslipidemia 03/31/2021   Urinary frequency 10/29/2017   Diabetes mellitus without complication ([place]) 38/93/7342   Special screening for malignant neoplasms, colon 11/25/2011   Diverticulosis of colon (without mention of hemorrhage) 11/25/2011   URTICARIA 01/31/2010   ECZEMA 05/03/2009    PCP: Sarajane Jews  REFERRING PROVIDER: Lonna Cobb DIAG: right knee pain  THERAPY DIAG:  Stiffness of right knee, not elsewhere  classified  Difficulty in walking, not elsewhere classified  Acute pain of right knee  Rationale for Evaluation and Treatment Rehabilitation  ONSET DATE: 08/27/21  SUBJECTIVE:   SUBJECTIVE STATEMENT: I feel fine, knee feels almost the same but does not hurt much.   PERTINENT HISTORY: Right TKA 08/27/21, Manipulation 11/20/21  PAIN:  Are you having pain? Yes: NPRS scale: 2/10 Pain location: right knee  Pain description: difficult to describe  Aggravating factors: bending the knee, working, standing, pain up to 8/10 Relieving factors: sitting  some pain medication pain at best 3/10  PRECAUTIONS: None  WEIGHT BEARING RESTRICTIONS No  FALLS:  Has patient fallen in last 6 months? No  LIVING ENVIRONMENT: Lives with: lives with their family Lives in: House/apartment Stairs: No  5 steps into the home Has following equipment at home: None  OCCUPATION: works at [employer], has to be on feet 8 hours a day, does some lifting  PLOF: Independent  started having right knee pain in 2017, was able to make it with injections  PATIENT GOALS have less pain and bend the knee better   OBJECTIVE:   DIAGNOSTIC FINDINGS: x-rays negative  PATIENT SURVEYS:  FOTO 43  COGNITION:  Overall cognitive status: Within functional limits for tasks assessed     SENSATION: Reports that the right knee is numb anterior  EDEMA:  Mild edema  MUSCLE LENGTH: Tight HS and calves  PALPATION: Slight warmth  to touch anterior right knee , mild tenderness  LOWER EXTREMITY ROM:  ActivePassive ROM Right AROM eval Left PROM eval 05/25/22 PROM  Hip flexion     Hip extension     Hip abduction     Hip adduction     Hip internal rotation     Hip external rotation     Knee flexion 83 90 95  Knee extension 15 8 5   Ankle dorsiflexion     Ankle plantarflexion     Ankle inversion     Ankle eversion      (Blank rows = not tested)  LOWER EXTREMITY MMT:  MMT Right eval Left eval  Hip flexion     Hip extension    Hip abduction    Hip adduction    Hip internal rotation    Hip external rotation    Knee flexion 4 4+  Knee extension 4 4+  Ankle dorsiflexion    Ankle plantarflexion    Ankle inversion    Ankle eversion     (Blank rows = not tested)  FUNCTIONAL TESTS:  Timed up and go (TUG): 14  GAIT: Distance walked: 100' Assistive device utilized: None Level of assistance: Complete Independence Comments: decreased right knee extension, mild antalgic on the right                     TODAY'S TREATMENT: 05/25/22 Walking outdoors up and down hill PROM measurements HS stretch, IT band stretch 2x30s Contract relax for flexion x5  Resisted gait 30# with step up 6' x5 each leg Resisted side steps 30#  Calf raises on black bar 2x12  Bike L2  w/power bursts every 20mns x636ms seat 8  STS with 8" step on top of treadmill 2x10   05/18/22 Nustep L5x6m41m  HS curls 25# 2x10 BLE, RLE 15# 2x10 Knee ext 10# BLE, RLE 5# 2x10 MET to quads then pushing into flexion x5 STS 2x10 w/yellow ball  Leg press 20# x10, 40# x10, RLE no weight x10  Anterior heel taps 4" 2x10 Lateral heel taps 4" 2x10    05/06/22 NuStep L5 x 6 min, LE only HS curls 15# RLE 2 x 10 reps Knee ext B 10#, 2 x 10 reps Step ups, forward, side, crossover, 2 x 10 each with RLE, 6" step STM to quads, HS, ITB, calves, Quad stretch in supine, patellar mobs in all directions, grade III, P knee ext stretch, passive, AA, and contract relax stretching for knee flexion. PROM 6-98   05/04/22   NuStep L3 x6 min LE only    R knee PROM, Contract relax for flexion   Leg press 20lb 2x10 progressive lowering, RLE no weight x5    Sit to stands holding yellow ball 2x10    Step ups 6in x10 each    Hamstring Curls RLE 15lb 2x10   Leg ext lb 2x10    04/29/22   NuStep-L4 x 6 minutes.   HS curl 20#, 2 x 10 reps   Knee ext 20#, 2 x 10 reps   Supine patellar mobs F/B AA knee ext ROM with foot elevated on wedge.   Seated  patellar mobs into depression F/B contract relax and P knee flexion ROM   PROM- 9-95   Standing TKE against Green Tband 2 x 10 reps.   Standing knee stretch at steps, 5 x 10 sec   Step ups on 8" step forward, side step, crossover, 10 each, no UE support.  8/21                      TherEx:                      -Bike L0 x6 minutes seat 7 for flexion ROM                      - knee flexion stretch on steps 10x10 second holds                      - bridges with red TB x15                      - hip sidelying ABD 1x10 B red TB                       - prone hip extensions red TB 1x10                     - walking bridges 1x10                      -forward and lateral step ups 6 inch step x15 B                    - eccentric step downs 1x10 B 4 inch step                      - hip hikes with and without swing 1x10 B                       Manual                     - knee flexion stretching/ROM- only to 78-80 degrees today, very stiff                     - patella mobs all directions grade III   8/17 Nustep L4 x9mns Bike L2 x566ms (seat 9)  PROM flex/ext  Step ups 6" Knee ext 20# 2x10, 5# RLE x10  HS curls 20# 2x10, 10# RLE x10 Prone quad stretch 2x30s RLE  Bridges 2x10    PATIENT EDUCATION:  Education details: POC and low load long duration flexion stretch Person educated: Patient Education method: ExConsulting civil engineerDemonstration, Tactile cues, Verbal cues, and Handouts Education comprehension: verbalized understanding, returned demonstration, and verbal cues required   HOME EXERCISE PROGRAM: As above  ASSESSMENT:  CLINICAL IMPRESSION: Patient reports no pain in R knee, states she can bend it a little bit more. Took PROM measurements today and can get up to 95d of knee flexion. We continued working on LE stretching and strengthening today. Does well with STS from low height today.   OBJECTIVE IMPAIRMENTS Abnormal gait, decreased activity tolerance,  decreased balance, decreased mobility, difficulty walking, decreased ROM, decreased strength, increased edema, impaired flexibility, impaired tone, and pain.   REHAB POTENTIAL: Good  CLINICAL DECISION MAKING: Stable/uncomplicated  EVALUATION COMPLEXITY: Low   GOALS: Goals reviewed with patient? Yes  SHORT TERM GOALS: Target date: 04/22/22 Independent with initial HEP Goal status: progressing   LONG TERM GOALS: Target date: 07/01/22  Independent with advanced HEP Goal status: INITIAL  2.  Decrease pain overall by 50% Goal status: ongoing  3.  Increase PROM to 110 degrees flexion  Goal status: ongoing  4.  Increase AROM of the knee flexion to 105 degrees Goal status:ongoing  PLAN: PT FREQUENCY: 1-2x/week  PT DURATION: 12 weeks  PLANNED INTERVENTIONS: Therapeutic exercises, Therapeutic activity, Neuromuscular re-education, Balance training, Gait training, Patient/Family education, Self Care, Joint mobilization, Stair training, Dry Needling, Electrical stimulation, Cryotherapy, Moist heat, Taping, Vasopneumatic device, and Manual therapy  PLAN FOR NEXT SESSION: work on the flexion, STS, leg press, MET    Ethel Rana DPT 05/25/22 9:28 AM  05/25/2022, 9:28 AM

## 2022-05-25 ENCOUNTER — Encounter: Payer: Self-pay | Admitting: Family Medicine

## 2022-05-25 ENCOUNTER — Ambulatory Visit: Payer: 59

## 2022-05-25 ENCOUNTER — Ambulatory Visit (INDEPENDENT_AMBULATORY_CARE_PROVIDER_SITE_OTHER): Payer: 59

## 2022-05-25 ENCOUNTER — Ambulatory Visit: Payer: 59 | Admitting: Family Medicine

## 2022-05-25 VITALS — BP 132/80 | HR 82 | Temp 98.8°F | Wt 149.0 lb

## 2022-05-25 DIAGNOSIS — M25661 Stiffness of right knee, not elsewhere classified: Secondary | ICD-10-CM | POA: Diagnosis not present

## 2022-05-25 DIAGNOSIS — M545 Low back pain, unspecified: Secondary | ICD-10-CM

## 2022-05-25 DIAGNOSIS — G8929 Other chronic pain: Secondary | ICD-10-CM | POA: Diagnosis not present

## 2022-05-25 DIAGNOSIS — R262 Difficulty in walking, not elsewhere classified: Secondary | ICD-10-CM

## 2022-05-25 DIAGNOSIS — M546 Pain in thoracic spine: Secondary | ICD-10-CM

## 2022-05-25 DIAGNOSIS — M25561 Pain in right knee: Secondary | ICD-10-CM

## 2022-05-25 MED ORDER — MELOXICAM 7.5 MG PO TABS: 7.5000 mg | ORAL_TABLET | Freq: Every day | ORAL | 5 refills | Status: DC

## 2022-05-25 NOTE — Progress Notes (Signed)
   Subjective:    Patient ID: [name] [name], female    DOB: August 08, 1956, 66 y.o.   MRN: [id number]  HPI Here for intermittent low back pain that started about 3 years ago. No hx of trauma. The pain is sharp and it shoots to both sides of her back. No leg pain. She gets this pain about every 2-3 weeks, and it lasts about a week each time. She only feels the pain when she moves certain ways, such as twisting her trunk or reaching. Ibuprofen helps but this does not last very long.    Review of Systems  Constitutional: Negative.   Respiratory: Negative.    Cardiovascular: Negative.   Genitourinary: Negative.   Musculoskeletal:  Positive for back pain.       Objective:   Physical Exam Constitutional:      General: She is not in acute distress.    Appearance: Normal appearance.  Cardiovascular:     Rate and Rhythm: Normal rate and regular rhythm.     Pulses: Normal pulses.     Heart sounds: Normal heart sounds.  Pulmonary:     Effort: Pulmonary effort is normal.     Breath sounds: Normal breath sounds.  Musculoskeletal:     Comments: She is tender on both sides of the spine from the T11 area to L3. No tenderness directly over the spine. She has pain on lateral rotation of the spine, but none from flexion or extension.   Neurological:     Mental Status: She is alert.           Assessment & Plan:  Low back pain. We will get Xrays today of the thoracic and lumbar spine areas. She will try Meloxicam 7.5 mg daily.  Alysia Penna, MD

## 2022-05-28 ENCOUNTER — Ambulatory Visit: Payer: 59 | Admitting: Physical Therapy

## 2022-05-28 DIAGNOSIS — M25661 Stiffness of right knee, not elsewhere classified: Secondary | ICD-10-CM | POA: Diagnosis not present

## 2022-05-28 DIAGNOSIS — R262 Difficulty in walking, not elsewhere classified: Secondary | ICD-10-CM

## 2022-05-28 NOTE — Therapy (Signed)
OUTPATIENT PHYSICAL THERAPY LOWER EXTREMITY TREATMENT   Patient Name: [name] [name] MRN: [id number] DOB:07-05-1956, 66 y.o., female Today's Date: 05/28/2022   PT End of Session - 05/28/22 1610     Visit Number 9    Date for PT Re-Evaluation 07/09/22    Authorization Type [employer]    PT Start Time 1610    PT Stop Time 1655    PT Time Calculation (min) 45 min                  Past Medical History:  Diagnosis Date   Arthritis    Complication of anesthesia    Diabetes mellitus without complication (HCC)    PONV (postoperative nausea and vomiting)    Ruptured ovarian cyst    Past Surgical History:  Procedure Laterality Date   CESAREAN SECTION     COLONOSCOPY  06/05/2019   per Dr. Tarri Glenn, single adenomatous polyp, repeat in 7 yrs   KNEE ARTHROPLASTY Right 08/27/2021   Procedure: COMPUTER ASSISTED TOTAL KNEE ARTHROPLASTY;  Surgeon: Rod Can, MD;  Location: [facility];  Service: Orthopedics;  Laterality: Right;   KNEE CLOSED REDUCTION Right 11/20/2021   Procedure: CLOSED MANIPULATION KNEE;  Surgeon: Rod Can, MD;  Location: [facility];  Service: Orthopedics;  Laterality: Right;   lumpectomy,benign, right breast     x2   RIGHT SHOULDER SURGERY      Patient Active Problem List   Diagnosis Date Noted   Primary hypertension 03/09/2022   Osteoarthritis of right knee 08/27/2021   S/P total knee arthroplasty, 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   COVID-19 virus infection 04/09/2021   Dyslipidemia 03/31/2021   Urinary frequency 10/29/2017   Diabetes mellitus without complication ([place]) 92/42/6834   Special screening for malignant neoplasms, colon 11/25/2011   Diverticulosis of colon (without mention of hemorrhage) 11/25/2011   URTICARIA 01/31/2010   ECZEMA 05/03/2009    PCP: Sarajane Jews  REFERRING PROVIDER: Lonna Cobb DIAG: right knee pain  THERAPY DIAG:  Stiffness of right knee, not elsewhere classified  Difficulty in walking, not elsewhere classified  Rationale for Evaluation  and Treatment Rehabilitation  ONSET DATE: 08/27/21  SUBJECTIVE:   SUBJECTIVE STATEMENT: Same-reports nothing new  PERTINENT HISTORY: Right TKA 08/27/21, Manipulation 11/20/21  PAIN:  Are you having pain? Yes: NPRS scale: 2/10 Pain location: right knee  Pain description: difficult to describe  Aggravating factors: bending the knee, working, standing, pain up to 8/10 Relieving factors: sitting  some pain medication pain at best 3/10  PRECAUTIONS: None  WEIGHT BEARING RESTRICTIONS No  FALLS:  Has patient fallen in last 6 months? No  LIVING ENVIRONMENT: Lives with: lives with their family Lives in: House/apartment Stairs: No  5 steps into the home Has following equipment at home: None  OCCUPATION: works at [employer], has to be on feet 8 hours a day, does some lifting  PLOF: Independent  started having right knee pain in 2017, was able to make it with injections  PATIENT GOALS have less pain and bend the knee better   OBJECTIVE:   DIAGNOSTIC FINDINGS: x-rays negative  PATIENT SURVEYS:  FOTO 43  COGNITION:  Overall cognitive status: Within functional limits for tasks assessed     SENSATION: Reports that the right knee is numb anterior  EDEMA:  Mild edema  MUSCLE LENGTH: Tight HS and calves  PALPATION: Slight warmth to touch anterior right knee , mild tenderness  LOWER EXTREMITY ROM:  ActivePassive ROM Right AROM eval Left PROM eval 05/25/22 PROM  Hip flexion  Hip extension     Hip abduction     Hip adduction     Hip internal rotation     Hip external rotation     Knee flexion 83 90 95  Knee extension _0 Ankle dorsiflexion     Ankle plantarflexion     Ankle inversion     Ankle eversion      (Blank rows = not tested)  LOWER EXTREMITY MMT:  MMT Right eval Left eval  Hip flexion    Hip extension    Hip abduction    Hip adduction    Hip internal rotation    Hip external rotation    Knee flexion 4 4+  Knee extension 4 4+  Ankle  dorsiflexion    Ankle plantarflexion    Ankle inversion    Ankle eversion     (Blank rows = not tested)  FUNCTIONAL TESTS:  Timed up and go (TUG): 14  GAIT: Distance walked: 100' Assistive device utilized: None Level of assistance: Complete Independence Comments: decreased right knee extension, mild antalgic on the right                     TODAY'S TREATMENT:  05/28/22 Nustep L 5 6 min LE only STS with 8" step on top of treadmill 10x without UE then 6 inch with need for UE minimally 10 x Step up over and reverse 10 x 4 inch with rt leg on then 10 x 6 inch Resisted gait 30# with step up 6' x5 each leg Leg Press RT unlocked for ROM 10x, 20# 10x but knee had had to be more than 90 to get up 2 sets  HS curl 15# 2 sets 10 RT LE Knee ext RT LE only 5# 2 sets 10 Passive stretching with and without CR into flexion Act ROM sitting before  97  after  101. Act ext -2 STS SL from elevated mat 2 sets 10 occasional use of UE CGA Standing HS curl 5# 2 sets 10      05/25/22 Walking outdoors up and down hill PROM measurements HS stretch, IT band stretch 2x30s Contract relax for flexion x5  Resisted gait 30# with step up 6' x5 each leg Resisted side steps 30#  Calf raises on black bar 2x12  Bike L2  w/power bursts every 8mns x670ms seat 8  STS with 8" step on top of treadmill 2x10   05/18/22 Nustep L5x6m53m  HS curls 25# 2x10 BLE, RLE 15# 2x10 Knee ext 10# BLE, RLE 5# 2x10 MET to quads then pushing into flexion x5 STS 2x10 w/yellow ball  Leg press 20# x10, 40# x10, RLE no weight x10  Anterior heel taps 4" 2x10 Lateral heel taps 4" 2x10    05/06/22 NuStep L5 x 6 min, LE only HS curls 15# RLE 2 x 10 reps Knee ext B 10#, 2 x 10 reps Step ups, forward, side, crossover, 2 x 10 each with RLE, 6" step STM to quads, HS, ITB, calves, Quad stretch in supine, patellar mobs in all directions, grade III, P knee ext stretch, passive, AA, and contract relax stretching for knee  flexion. PROM 6-98   05/04/22   NuStep L3 x6 min LE only    R knee PROM, Contract relax for flexion   Leg press 20lb 2x10 progressive lowering, RLE no weight x5    Sit to stands holding yellow ball 2x10    Step ups 6in x10 each  Hamstring Curls RLE 15lb 2x10   Leg ext lb 2x10    04/29/22   NuStep-L4 x 6 minutes.   HS curl 20#, 2 x 10 reps   Knee ext 20#, 2 x 10 reps   Supine patellar mobs F/B AA knee ext ROM with foot elevated on wedge.   Seated patellar mobs into depression F/B contract relax and P knee flexion ROM   PROM- 9-95   Standing TKE against Green Tband 2 x 10 reps.   Standing knee stretch at steps, 5 x 10 sec   Step ups on 8" step forward, side step, crossover, 10 each, no UE support.                        8/21                      TherEx:                      -Bike L0 x6 minutes seat 7 for flexion ROM                      - knee flexion stretch on steps 10x10 second holds                      - bridges with red TB x15                      - hip sidelying ABD 1x10 B red TB                       - prone hip extensions red TB 1x10                     - walking bridges 1x10                      -forward and lateral step ups 6 inch step x15 B                    - eccentric step downs 1x10 B 4 inch step                      - hip hikes with and without swing 1x10 B                       Manual                     - knee flexion stretching/ROM- only to 78-80 degrees today, very stiff                     - patella mobs all directions grade III   8/17 Nustep L4 x23mns Bike L2 x571ms (seat 9)  PROM flex/ext  Step ups 6" Knee ext 20# 2x10, 5# RLE x10  HS curls 20# 2x10, 10# RLE x10 Prone quad stretch 2x30s RLE  Bridges 2x10    PATIENT EDUCATION:  Education details: POC and low load long duration flexion stretch Person educated: Patient Education method: ExConsulting civil engineerDemonstration, Tactile cues, Verbal cues, and Handouts Education comprehension: verbalized  understanding, returned demonstration, and verbal cues required   HOME EXERCISE PROGRAM: As above  ASSESSMENT:  CLINICAL IMPRESSION: Progressed func strength and ROM with cuing- STS from lower height  and SL. Improved ROM -see measurements above   OBJECTIVE IMPAIRMENTS Abnormal gait, decreased activity tolerance, decreased balance, decreased mobility, difficulty walking, decreased ROM, decreased strength, increased edema, impaired flexibility, impaired tone, and pain.   REHAB POTENTIAL: Good  CLINICAL DECISION MAKING: Stable/uncomplicated  EVALUATION COMPLEXITY: Low   GOALS: Goals reviewed with patient? Yes  SHORT TERM GOALS: Target date: 04/22/22 Independent with initial HEP Goal status: progressing   LONG TERM GOALS: Target date: 07/01/22  Independent with advanced HEP Goal status: INITIAL  2.  Decrease pain overall by 50% Goal status: ongoing  3.  Increase PROM to 110 degrees flexion Goal status: ongoing  4.  Increase AROM of the knee flexion to 105 degrees Goal status:ongoing  PLAN: PT FREQUENCY: 1-2x/week  PT DURATION: 12 weeks  PLANNED INTERVENTIONS: Therapeutic exercises, Therapeutic activity, Neuromuscular re-education, Balance training, Gait training, Patient/Family education, Self Care, Joint mobilization, Stair training, Dry Needling, Electrical stimulation, Cryotherapy, Moist heat, Taping, Vasopneumatic device, and Manual therapy  PLAN FOR NEXT SESSION: work on the flexion ROM and strength   Luetta Piazza PTA 05/28/22 4:11 PM  05/28/2022, [address] [place]. [place], [place], [address] Phone: [phone]   Fax:  [phone]  Patient Details  Name: [name] [name] MRN: [id number] Date of Birth: 19-Mar-1956 Referring Provider:  Laurey Morale, MD  Encounter Date: 05/28/2022   Laqueta Carina, PTA 05/28/2022, 4:11 PM  [place] [place], [place], [address] Phone: [phone]   Fax:  [phone]

## 2022-05-29 NOTE — Progress Notes (Unsigned)
   I, Peterson Lombard, LAT, ATC acting as a scribe for Lynne Leader, MD.  [name] [name] is a 66 y.o. female who presents to [place] [facility] today for f/u chronic R knee pain. She has a hx of a R TKA w/ Dr. Lyla Glassing on 08/27/21 and a R knee MUA on 11/20/21.  She works at [employer] and has to stand the majority of the time. Pt was last seen by Dr. Georgina Snell on 05/04/22 and was advised to give PT another month, completing 9 total visits. Today, pt reports  Dx imaging: 03/23/22 R knee XR             11/20/21 R knee XR             08/27/21 R knee XR             02/05/17 R knee MRI             11/21/16 R knee XR  Pertinent review of systems: ***  Relevant historical information: ***   Exam:  There were no vitals taken for this visit. General: Well Developed, well nourished, and in no acute distress.   MSK: ***    Lab and Radiology Results No results found for this or any previous visit (from the past 72 hour(s)). DG Lumbar Spine Complete  Result Date: 05/26/2022 CLINICAL DATA:  Lower back pain. EXAM: LUMBAR SPINE - COMPLETE 4+ VIEW COMPARISON:  None Available. FINDINGS: There is no evidence of acute lumbar spine fracture. Chronic changes are seen within the lower sacrum and upper coccyx. Alignment is normal. Mild to moderate severity multilevel endplate sclerosis and mild anterior osteophyte formation are seen throughout the lumbar spine. This is most prominent at the levels of L1-L2, L2-L3 and L3-L4. Bilateral facet joint hypertrophy is noted at the level of L5-S1. Intervertebral disc spaces are maintained. IMPRESSION: 1. 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 Electronically Signed   By: Virgina Norfolk M.D.   On: 05/26/2022 22:41   DG Thoracic Spine 2 View  Result Date: 05/26/2022 CLINICAL DATA:  Middle back pain. EXAM: THORACIC SPINE 2 VIEWS COMPARISON:  None Available. FINDINGS: There is no evidence of thoracic spine fracture.  Alignment is normal. Moderate severity multilevel endplate sclerosis and lateral osteophyte formation are seen throughout all levels of the thoracic spine. Mild to moderate severity multilevel intervertebral disc space narrowing is also noted. No other significant bone abnormalities are identified. IMPRESSION: 1. 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 Electronically Signed   By: Virgina Norfolk M.D.   On: 05/26/2022 22:37       Assessment and Plan: 66 y.o. female with ***   PDMP not reviewed this encounter. No orders of the defined types were placed in this encounter.  No orders of the defined types were placed in this encounter.    Discussed warning signs or symptoms. Please see discharge instructions. Patient expresses understanding.   ***

## 2022-06-01 ENCOUNTER — Encounter: Payer: Self-pay | Admitting: Physical Therapy

## 2022-06-01 ENCOUNTER — Ambulatory Visit: Payer: 59 | Admitting: Family Medicine

## 2022-06-01 ENCOUNTER — Ambulatory Visit: Payer: 59 | Admitting: Physical Therapy

## 2022-06-01 VITALS — BP 142/76 | HR 87 | Ht 61.0 in | Wt 150.0 lb

## 2022-06-01 DIAGNOSIS — G8929 Other chronic pain: Secondary | ICD-10-CM

## 2022-06-01 DIAGNOSIS — M25561 Pain in right knee: Secondary | ICD-10-CM | POA: Diagnosis not present

## 2022-06-01 DIAGNOSIS — M25661 Stiffness of right knee, not elsewhere classified: Secondary | ICD-10-CM

## 2022-06-01 DIAGNOSIS — R262 Difficulty in walking, not elsewhere classified: Secondary | ICD-10-CM

## 2022-06-01 NOTE — Therapy (Signed)
OUTPATIENT PHYSICAL THERAPY LOWER EXTREMITY TREATMENT   Patient Name: [name] [name] MRN: [id number] DOB:28-Apr-1956, 66 y.o., female Today's Date: 06/01/2022   PT End of Session - 06/01/22 1232     Visit Number 10    Date for PT Re-Evaluation 07/09/22    PT Start Time 1232    PT Stop Time 1310    PT Time Calculation (min) 38 min    Activity Tolerance Patient tolerated treatment well    Behavior During Therapy [facility] for tasks assessed/performed           Progress Note Reporting Period 04/08/22 to 06/01/22  See note below for Objective Data and Assessment of Progress/Goals.       Past Medical History:  Diagnosis Date   Arthritis    Complication of anesthesia    Diabetes mellitus without complication (HCC)    PONV (postoperative nausea and vomiting)    Ruptured ovarian cyst    Past Surgical History:  Procedure Laterality Date   CESAREAN SECTION     COLONOSCOPY  06/05/2019   per Dr. Tarri Glenn, single adenomatous polyp, repeat in 7 yrs   KNEE ARTHROPLASTY Right 08/27/2021   Procedure: COMPUTER ASSISTED TOTAL KNEE ARTHROPLASTY;  Surgeon: Rod Can, MD;  Location: [facility];  Service: Orthopedics;  Laterality: Right;   KNEE CLOSED REDUCTION Right 11/20/2021   Procedure: CLOSED MANIPULATION KNEE;  Surgeon: Rod Can, MD;  Location: [facility];  Service: Orthopedics;  Laterality: Right;   lumpectomy,benign, right breast     x2   RIGHT SHOULDER SURGERY      Patient Active Problem List   Diagnosis Date Noted   Primary hypertension 03/09/2022   Osteoarthritis of right knee 08/27/2021   S/P total knee arthroplasty, 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   COVID-19 virus infection 04/09/2021   Dyslipidemia 03/31/2021   Urinary frequency 10/29/2017   Diabetes mellitus without complication ([place]) 44/81/8563   Special screening for malignant neoplasms, colon 11/25/2011   Diverticulosis of colon (without mention of hemorrhage) 11/25/2011   URTICARIA 01/31/2010   ECZEMA 05/03/2009    PCP:  Sarajane Jews  REFERRING PROVIDER: Lonna Cobb DIAG: right knee pain  THERAPY DIAG:  Stiffness of right knee, not elsewhere classified  Difficulty in walking, not elsewhere classified  Acute pain of right knee  Rationale for Evaluation and Treatment Rehabilitation  ONSET DATE: 08/27/21  SUBJECTIVE:   SUBJECTIVE STATEMENT: Patient reports no changes. She says that when walking, her R knee hurts to bend it.  PERTINENT HISTORY: Right TKA 08/27/21, Manipulation 11/20/21  PAIN:  Are you having pain? Yes: NPRS scale: 2/10 Pain location: right knee  Pain description: difficult to describe  Aggravating factors: bending the knee, working, standing, pain up to 8/10 Relieving factors: sitting  some pain medication pain at best 3/10  PRECAUTIONS: None  WEIGHT BEARING RESTRICTIONS No  FALLS:  Has patient fallen in last 6 months? No  LIVING ENVIRONMENT: Lives with: lives with their family Lives in: House/apartment Stairs: No  5 steps into the home Has following equipment at home: None  OCCUPATION: works at [employer], has to be on feet 8 hours a day, does some lifting  PLOF: Independent  started having right knee pain in 2017, was able to make it with injections  PATIENT GOALS have less pain and bend the knee better   OBJECTIVE:   DIAGNOSTIC FINDINGS: x-rays negative  PATIENT SURVEYS:  FOTO 43  COGNITION:  Overall cognitive status: Within functional limits for tasks assessed     SENSATION: Reports that the right knee  is numb anterior  EDEMA:  Mild edema  MUSCLE LENGTH: Tight HS and calves  PALPATION: Slight warmth to touch anterior right knee , mild tenderness  LOWER EXTREMITY ROM:  ActivePassive ROM Right AROM eval Left PROM eval 05/25/22 PROM  Hip flexion     Hip extension     Hip abduction     Hip adduction     Hip internal rotation     Hip external rotation     Knee flexion 83 90 95  Knee extension _0 Ankle dorsiflexion     Ankle  plantarflexion     Ankle inversion     Ankle eversion      (Blank rows = not tested)  LOWER EXTREMITY MMT:  MMT Right eval Left eval  Hip flexion    Hip extension    Hip abduction    Hip adduction    Hip internal rotation    Hip external rotation    Knee flexion 4 4+  Knee extension 4 4+  Ankle dorsiflexion    Ankle plantarflexion    Ankle inversion    Ankle eversion     (Blank rows = not tested)  FUNCTIONAL TESTS:  Timed up and go (TUG): 14  GAIT: Distance walked: 100' Assistive device utilized: None Level of assistance: Complete Independence Comments: decreased right knee extension, mild antalgic on the right                     TODAY'S TREATMENT: 06/01/22 NuStep L5 x 6 minutes STM to R distal quad, patellar mobilizations P and AAROM for knee flexion including seated scooting forward, using opposite leg, step stretch, therapist assisted.  Knee flexion against 15#, RLE only Knee Ext against 5#, RLE only-2 x 10 each Side to side step against G Tband Sit to stand 2 x 10 reps    05/28/22 Nustep L 5 6 min LE only STS with 8" step on top of treadmill 10x without UE then 6 inch with need for UE minimally 10 x Step up over and reverse 10 x 4 inch with rt leg on then 10 x 6 inch Resisted gait 30# with step up 6' x5 each leg Leg Press RT unlocked for ROM 10x, 20# 10x but knee had had to be more than 90 to get up 2 sets  HS curl 15# 2 sets 10 RT LE Knee ext RT LE only 5# 2 sets 10 Passive stretching with and without CR into flexion Act ROM sitting before  97  after  101. Act ext -2 STS SL from elevated mat 2 sets 10 occasional use of UE CGA Standing HS curl 5# 2 sets 10   05/25/22 Walking outdoors up and down hill PROM measurements HS stretch, IT band stretch 2x30s Contract relax for flexion x5  Resisted gait 30# with step up 6' x5 each leg Resisted side steps 30#  Calf raises on black bar 2x12  Bike L2  w/power bursts every 10mns x644ms seat 8  STS with 8"  step on top of treadmill 2x10   05/18/22 Nustep L5x6m36m  HS curls 25# 2x10 BLE, RLE 15# 2x10 Knee ext 10# BLE, RLE 5# 2x10 MET to quads then pushing into flexion x5 STS 2x10 w/yellow ball  Leg press 20# x10, 40# x10, RLE no weight x10  Anterior heel taps 4" 2x10 Lateral heel taps 4" 2x10    05/06/22 NuStep L5 x 6 min, LE only HS curls 15# RLE 2 x 10  reps Knee ext B 10#, 2 x 10 reps Step ups, forward, side, crossover, 2 x 10 each with RLE, 6" step STM to quads, HS, ITB, calves, Quad stretch in supine, patellar mobs in all directions, grade III, P knee ext stretch, passive, AA, and contract relax stretching for knee flexion. PROM 6-98   05/04/22   NuStep L3 x6 min LE only    R knee PROM, Contract relax for flexion   Leg press 20lb 2x10 progressive lowering, RLE no weight x5    Sit to stands holding yellow ball 2x10    Step ups 6in x10 each    Hamstring Curls RLE 15lb 2x10   Leg ext lb 2x10    04/29/22   NuStep-L4 x 6 minutes.   HS curl 20#, 2 x 10 reps   Knee ext 20#, 2 x 10 reps   Supine patellar mobs F/B AA knee ext ROM with foot elevated on wedge.   Seated patellar mobs into depression F/B contract relax and P knee flexion ROM   PROM- 9-95   Standing TKE against Green Tband 2 x 10 reps.   Standing knee stretch at steps, 5 x 10 sec   Step ups on 8" step forward, side step, crossover, 10 each, no UE support.                        8/21                      TherEx:                      -Bike L0 x6 minutes seat 7 for flexion ROM                      - knee flexion stretch on steps 10x10 second holds                      - bridges with red TB x15                      - hip sidelying ABD 1x10 B red TB                       - prone hip extensions red TB 1x10                     - walking bridges 1x10                      -forward and lateral step ups 6 inch step x15 B                    - eccentric step downs 1x10 B 4 inch step                      - hip hikes with and  without swing 1x10 B                       Manual                     - knee flexion stretching/ROM- only to 78-80 degrees today, very stiff                     - patella  mobs all directions grade III   8/17 Nustep L4 x25mns Bike L2 x543ms (seat 9)  PROM flex/ext  Step ups 6" Knee ext 20# 2x10, 5# RLE x10  HS curls 20# 2x10, 10# RLE x10 Prone quad stretch 2x30s RLE  Bridges 2x10    PATIENT EDUCATION:  Education details: POC and low load long duration flexion stretch Person educated: Patient Education method: ExConsulting civil engineerDemonstration, Tactile cues, Verbal cues, and Handouts Education comprehension: verbalized understanding, returned demonstration, and verbal cues required   HOME EXERCISE PROGRAM:  [id number]  ASSESSMENT:  CLINICAL IMPRESSION: Progressed func strength, knee flexion ROM remains difficult to achieve. Updated HEP to emphasize knee flexion as well as strength.   OBJECTIVE IMPAIRMENTS Abnormal gait, decreased activity tolerance, decreased balance, decreased mobility, difficulty walking, decreased ROM, decreased strength, increased edema, impaired flexibility, impaired tone, and pain.   REHAB POTENTIAL: Good  CLINICAL DECISION MAKING: Stable/uncomplicated  EVALUATION COMPLEXITY: Low   GOALS: Goals reviewed with patient? Yes  SHORT TERM GOALS: Target date: 04/22/22 Independent with initial HEP Goal status: progressing   LONG TERM GOALS: Target date: 07/01/22  Independent with advanced HEP Goal status: ongoing  2.  Decrease pain overall by 50% Current:9/25-Patient reports max pain of 5/10, happens at night after work, but improves with ice and improved by the next morning. Max was 8/10 Goal status: ongoing  3.  Increase PROM to 110 degrees flexion Current: 9/25-102 Goal status: ongoing  4.  Increase AROM of the knee flexion to 105 degrees Current:9/25-93 Goal status:ongoing  PLAN: PT FREQUENCY: 1-2x/week  PT DURATION: 12 weeks  PLANNED  INTERVENTIONS: Therapeutic exercises, Therapeutic activity, Neuromuscular re-education, Balance training, Gait training, Patient/Family education, Self Care, Joint mobilization, Stair training, Dry Needling, Electrical stimulation, Cryotherapy, Moist heat, Taping, Vasopneumatic device, and Manual therapy  PLAN FOR NEXT SESSION: work on the flexion ROM assess, update HEP   Patient Details  Name: [name] OrsakRN: [id number] of Birth: 10/1955-01-23eferring Provider:  FrLaurey MoraleMD  Encounter Date: 06/01/2022   SuMarcelina MorelDPT 06/01/2022, 1:17 PM  [id number]: [phone] Fax:  [phone]

## 2022-06-01 NOTE — Patient Instructions (Addendum)
Thank you for coming in today.   Plan for bone scan in December.  Continue to work on the exercises.  If the scan shows that the hardware is loosening I will get you an appointment with a different surgeon.  If the hardware looks ok we can give this more time.

## 2022-06-03 ENCOUNTER — Ambulatory Visit: Payer: 59 | Admitting: Physical Therapy

## 2022-06-03 ENCOUNTER — Encounter: Payer: Self-pay | Admitting: Physical Therapy

## 2022-06-03 DIAGNOSIS — M25661 Stiffness of right knee, not elsewhere classified: Secondary | ICD-10-CM | POA: Diagnosis not present

## 2022-06-03 DIAGNOSIS — R262 Difficulty in walking, not elsewhere classified: Secondary | ICD-10-CM

## 2022-06-03 DIAGNOSIS — M25561 Pain in right knee: Secondary | ICD-10-CM

## 2022-06-03 NOTE — Therapy (Signed)
OUTPATIENT PHYSICAL THERAPY LOWER EXTREMITY TREATMENT   Patient Name: [name] [name] MRN: [id number] DOB:1955-12-31, 66 y.o., female Today's Date: 06/03/2022   PT End of Session - 06/03/22 1708     Visit Number 11    Date for PT Re-Evaluation 07/09/22    PT Start Time 1711    PT Stop Time 1750    PT Time Calculation (min) 39 min    Activity Tolerance Patient tolerated treatment well    Behavior During Therapy [facility] for tasks assessed/performed            PT Daily Note   See note below for Objective Data and Assessment of Progress/Goals.       Past Medical History:  Diagnosis Date   Arthritis    Complication of anesthesia    Diabetes mellitus without complication (HCC)    PONV (postoperative nausea and vomiting)    Ruptured ovarian cyst    Past Surgical History:  Procedure Laterality Date   CESAREAN SECTION     COLONOSCOPY  06/05/2019   per Dr. Tarri Glenn, single adenomatous polyp, repeat in 7 yrs   KNEE ARTHROPLASTY Right 08/27/2021   Procedure: COMPUTER ASSISTED TOTAL KNEE ARTHROPLASTY;  Surgeon: Rod Can, MD;  Location: [facility];  Service: Orthopedics;  Laterality: Right;   KNEE CLOSED REDUCTION Right 11/20/2021   Procedure: CLOSED MANIPULATION KNEE;  Surgeon: Rod Can, MD;  Location: [facility];  Service: Orthopedics;  Laterality: Right;   lumpectomy,benign, right breast     x2   RIGHT SHOULDER SURGERY      Patient Active Problem List   Diagnosis Date Noted   Primary hypertension 03/09/2022   Osteoarthritis of right knee 08/27/2021   S/P total knee arthroplasty, 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   COVID-19 virus infection 04/09/2021   Dyslipidemia 03/31/2021   Urinary frequency 10/29/2017   Diabetes mellitus without complication ([place]) 70/96/2836   Special screening for malignant neoplasms, colon 11/25/2011   Diverticulosis of colon (without mention of hemorrhage) 11/25/2011   URTICARIA 01/31/2010   ECZEMA 05/03/2009    PCP: Sarajane Jews  REFERRING PROVIDER:  Lonna Cobb DIAG: right knee pain  THERAPY DIAG:  Stiffness of right knee, not elsewhere classified  Difficulty in walking, not elsewhere classified  Acute pain of right knee  Rationale for Evaluation and Treatment Rehabilitation  ONSET DATE: 08/27/21  SUBJECTIVE:   SUBJECTIVE STATEMENT: Patient reports no changes.   PERTINENT HISTORY: Right TKA 08/27/21, Manipulation 11/20/21  PAIN:  Are you having pain? Yes: NPRS scale: 2/10 Pain location: right knee  Pain description: difficult to describe  Aggravating factors: bending the knee, working, standing, pain up to 8/10 Relieving factors: sitting  some pain medication pain at best 3/10  PRECAUTIONS: None  WEIGHT BEARING RESTRICTIONS No  FALLS:  Has patient fallen in last 6 months? No  LIVING ENVIRONMENT: Lives with: lives with their family Lives in: House/apartment Stairs: No  5 steps into the home Has following equipment at home: None  OCCUPATION: works at [employer], has to be on feet 8 hours a day, does some lifting  PLOF: Independent  started having right knee pain in 2017, was able to make it with injections  PATIENT GOALS have less pain and bend the knee better   OBJECTIVE:   DIAGNOSTIC FINDINGS: x-rays negative  PATIENT SURVEYS:  FOTO 43  COGNITION:  Overall cognitive status: Within functional limits for tasks assessed     SENSATION: Reports that the right knee is numb anterior  EDEMA:  Mild edema  MUSCLE LENGTH: Tight HS  and calves  PALPATION: Slight warmth to touch anterior right knee , mild tenderness  LOWER EXTREMITY ROM:  ActivePassive ROM Right AROM eval Left PROM eval 05/25/22 PROM  Hip flexion     Hip extension     Hip abduction     Hip adduction     Hip internal rotation     Hip external rotation     Knee flexion 83 90 95  Knee extension 15 8 5   Ankle dorsiflexion     Ankle plantarflexion     Ankle inversion     Ankle eversion      (Blank rows = not  tested)  LOWER EXTREMITY MMT:  MMT Right eval Left eval  Hip flexion    Hip extension    Hip abduction    Hip adduction    Hip internal rotation    Hip external rotation    Knee flexion 4 4+  Knee extension 4 4+  Ankle dorsiflexion    Ankle plantarflexion    Ankle inversion    Ankle eversion     (Blank rows = not tested)  FUNCTIONAL TESTS:  Timed up and go (TUG): 14  GAIT: Distance walked: 100' Assistive device utilized: None Level of assistance: Complete Independence Comments: decreased right knee extension, mild antalgic on the right                     TODAY'S TREATMENT: 06/03/22 NuStep L5 x 6 minutes R knee flexion, 20#, 2 x 10 reps R knee ext, 5#, 2 x 10 reps Sit to stand from 8" step on top of treadmill, 2 x 10 reps STM to R quad, VM sore, patellar mobilizations inferior, AAROM with contract relax R knee PROM to 105 Step ups on Airex pad on to of 6" step x 10 reps on R.   06/01/22 NuStep L5 x 6 minutes STM to R distal quad, patellar mobilizations P and AAROM for knee flexion including seated scooting forward, using opposite leg, step stretch, therapist assisted.  Knee flexion against 15#, RLE only Knee Ext against 5#, RLE only-2 x 10 each Side to side step against G Tband Sit to stand 2 x 10 reps    05/28/22 Nustep L 5 6 min LE only STS with 8" step on top of treadmill 10x without UE then 6 inch with need for UE minimally 10 x Step up over and reverse 10 x 4 inch with rt leg on then 10 x 6 inch Resisted gait 30# with step up 6' x5 each leg Leg Press RT unlocked for ROM 10x, 20# 10x but knee had had to be more than 90 to get up 2 sets  HS curl 15# 2 sets 10 RT LE Knee ext RT LE only 5# 2 sets 10 Passive stretching with and without CR into flexion Act ROM sitting before  97  after  101. Act ext -2 STS SL from elevated mat 2 sets 10 occasional use of UE CGA Standing HS curl 5# 2 sets 10   05/25/22 Walking outdoors up and down hill PROM  measurements HS stretch, IT band stretch 2x30s Contract relax for flexion x5  Resisted gait 30# with step up 6' x5 each leg Resisted side steps 30#  Calf raises on black bar 2x12  Bike L2  w/power bursts every 30mns x647ms seat 8  STS with 8" step on top of treadmill 2x10   05/18/22 Nustep L5x6m73m  HS curls 25# 2x10 BLE, RLE 15# 2x10 Knee  ext 10# BLE, RLE 5# 2x10 MET to quads then pushing into flexion x5 STS 2x10 w/yellow ball  Leg press 20# x10, 40# x10, RLE no weight x10  Anterior heel taps 4" 2x10 Lateral heel taps 4" 2x10    05/06/22 NuStep L5 x 6 min, LE only HS curls 15# RLE 2 x 10 reps Knee ext B 10#, 2 x 10 reps Step ups, forward, side, crossover, 2 x 10 each with RLE, 6" step STM to quads, HS, ITB, calves, Quad stretch in supine, patellar mobs in all directions, grade III, P knee ext stretch, passive, AA, and contract relax stretching for knee flexion. PROM 6-98   05/04/22   NuStep L3 x6 min LE only    R knee PROM, Contract relax for flexion   Leg press 20lb 2x10 progressive lowering, RLE no weight x5    Sit to stands holding yellow ball 2x10    Step ups 6in x10 each    Hamstring Curls RLE 15lb 2x10   Leg ext lb 2x10    04/29/22   NuStep-L4 x 6 minutes.   HS curl 20#, 2 x 10 reps   Knee ext 20#, 2 x 10 reps   Supine patellar mobs F/B AA knee ext ROM with foot elevated on wedge.   Seated patellar mobs into depression F/B contract relax and P knee flexion ROM   PROM- 9-95   Standing TKE against Green Tband 2 x 10 reps.   Standing knee stretch at steps, 5 x 10 sec   Step ups on 8" step forward, side step, crossover, 10 each, no UE support.                        8/21                      TherEx:                      -Bike L0 x6 minutes seat 7 for flexion ROM                      - knee flexion stretch on steps 10x10 second holds                      - bridges with red TB x15                      - hip sidelying ABD 1x10 B red TB                       -  prone hip extensions red TB 1x10                     - walking bridges 1x10                      -forward and lateral step ups 6 inch step x15 B                    - eccentric step downs 1x10 B 4 inch step                      - hip hikes with and without swing 1x10 B  Manual                     - knee flexion stretching/ROM- only to 78-80 degrees today, very stiff                     - patella mobs all directions grade III   8/17 Nustep L4 x32mns Bike L2 x583ms (seat 9)  PROM flex/ext  Step ups 6" Knee ext 20# 2x10, 5# RLE x10  HS curls 20# 2x10, 10# RLE x10 Prone quad stretch 2x30s RLE  Bridges 2x10    PATIENT EDUCATION:  Education details: POC and low load long duration flexion stretch Person educated: Patient Education method: ExConsulting civil engineerDemonstration, Tactile cues, Verbal cues, and Handouts Education comprehension: verbalized understanding, returned demonstration, and verbal cues required   HOME EXERCISE PROGRAM:  [id number]  ASSESSMENT:  CLINICAL IMPRESSION: Progressed func strength, knee flexion ROM remains difficult to achieve. Updated HEP to emphasize knee flexion as well as strength.   OBJECTIVE IMPAIRMENTS Abnormal gait, decreased activity tolerance, decreased balance, decreased mobility, difficulty walking, decreased ROM, decreased strength, increased edema, impaired flexibility, impaired tone, and pain.   REHAB POTENTIAL: Good  CLINICAL DECISION MAKING: Stable/uncomplicated  EVALUATION COMPLEXITY: Low   GOALS: Goals reviewed with patient? Yes  SHORT TERM GOALS: Target date: 04/22/22 Independent with initial HEP Goal status: progressing   LONG TERM GOALS: Target date: 07/01/22  Independent with advanced HEP Goal status: ongoing  2.  Decrease pain overall by 50% Current:9/25-Patient reports max pain of 5/10, happens at night after work, but improves with ice and improved by the next morning. Max was 8/10 Goal status:  ongoing  3.  Increase PROM to 110 degrees flexion Current: 9/25-102 Goal status: ongoing  4.  Increase AROM of the knee flexion to 105 degrees Current:9/25-93 Goal status:ongoing  PLAN: PT FREQUENCY: 1-2x/week  PT DURATION: 12 weeks  PLANNED INTERVENTIONS: Therapeutic exercises, Therapeutic activity, Neuromuscular re-education, Balance training, Gait training, Patient/Family education, Self Care, Joint mobilization, Stair training, Dry Needling, Electrical stimulation, Cryotherapy, Moist heat, Taping, Vasopneumatic device, and Manual therapy  PLAN FOR NEXT SESSION: work on the flexion ROM assess, update HEP   Patient Details  Name: EsChere BabsonRN: [id number] of Birth: 2/08-Mar-1957eferring Provider:  FrLaurey MoraleMD  Encounter Date: 06/03/2022   SuMarcelina MorelDPT 06/03/2022, 5:43 PM  [id number]: [phone] Fax:  [phone]

## 2022-06-15 ENCOUNTER — Other Ambulatory Visit: Payer: 59

## 2022-07-13 ENCOUNTER — Other Ambulatory Visit: Payer: 59

## 2022-07-13 ENCOUNTER — Ambulatory Visit: Payer: 59 | Admitting: Family Medicine

## 2022-07-13 ENCOUNTER — Encounter: Payer: Self-pay | Admitting: Family Medicine

## 2022-07-13 VITALS — BP 112/70 | HR 70 | Temp 98.2°F | Wt 146.0 lb

## 2022-07-13 DIAGNOSIS — E119 Type 2 diabetes mellitus without complications: Secondary | ICD-10-CM | POA: Diagnosis not present

## 2022-07-13 DIAGNOSIS — J019 Acute sinusitis, unspecified: Secondary | ICD-10-CM | POA: Diagnosis not present

## 2022-07-13 LAB — HEMOGLOBIN A1C: Hgb A1c MFr Bld: 7.3 % — ABNORMAL HIGH (ref 4.6–6.5)

## 2022-07-13 MED ORDER — DOXYCYCLINE HYCLATE 100 MG PO CAPS: 100.0000 mg | ORAL_CAPSULE | Freq: Two times a day (BID) | ORAL | 0 refills | Status: AC

## 2022-07-13 MED ORDER — METFORMIN HCL 1000 MG PO TABS: 1000.0000 mg | ORAL_TABLET | Freq: Two times a day (BID) | ORAL | 3 refills | Status: DC

## 2022-07-13 NOTE — Progress Notes (Signed)
   Subjective:    Patient ID: [name] [name], female    DOB: 03/21/1956, 66 y.o.   MRN: [id number]  HPI Here to follow up an urgent care visit for an apparent sinus infection. About 6 weeks ago she developed PND and a dry cough. No fever or SOB. She htried Mucinex with no relief. Then she spent 2 weeks visiting family in [place] last month, and [facility]. They gave her an antibiotic of some sort, but this did not help.    Review of Systems  Constitutional: Negative.   HENT:  Positive for postnasal drip and sinus pressure. Negative for congestion, ear pain and sore throat.   Eyes: Negative.   Respiratory:  Positive for cough. Negative for shortness of breath and wheezing.        Objective:   Physical Exam Constitutional:      Appearance: Normal appearance.  HENT:     Right Ear: Tympanic membrane, ear canal and external ear normal.     Left Ear: Tympanic membrane, ear canal and external ear normal.     Nose: Nose normal.     Mouth/Throat:     Pharynx: Oropharynx is clear.  Eyes:     Conjunctiva/sclera: Conjunctivae normal.  Pulmonary:     Effort: Pulmonary effort is normal.     Breath sounds: Normal breath sounds.  Lymphadenopathy:     Cervical: No cervical adenopathy.  Neurological:     Mental Status: She is alert.           Assessment & Plan:  Partially treated sinusitis. We will give her 10 days of Doxycycline. Recheck as needed. Alysia Penna, MD

## 2022-07-13 NOTE — Addendum Note (Signed)
Addended by: Octavio Manns E on: 07/13/2022 07:39 AM   Modules accepted: Orders

## 2022-08-28 ENCOUNTER — Encounter (HOSPITAL_COMMUNITY)
Admission: RE | Admit: 2022-08-28 | Discharge: 2022-08-28 | Disposition: A | Discharge status: Home / Self Care | Payer: 59 | Source: Ambulatory Visit | Attending: Family Medicine | Admitting: Family Medicine

## 2022-08-28 DIAGNOSIS — M25561 Pain in right knee: Secondary | ICD-10-CM | POA: Diagnosis present

## 2022-08-28 DIAGNOSIS — G8929 Other chronic pain: Secondary | ICD-10-CM | POA: Insufficient documentation

## 2022-08-28 MED ORDER — TECHNETIUM TC 99M MEDRONATE IV KIT
20.0000 | PACK | Freq: Once | INTRAVENOUS | Status: AC | PRN
  Administered 2022-08-28: 20 via INTRAVENOUS

## 2022-09-03 NOTE — Progress Notes (Signed)
Rito Ehrlich, am serving as a Education administrator for Dr. Lynne Leader.   [name] [name] is a 66 y.o. female who presents to [place] at [facility] today for cont'd R knee pain and review of 3-phase bone scan. Pt was last seen by Dr. Georgina Snell on 06/01/22 and was advised to proceed to 3-phase bone scan in December and to cont HEP. Today, pt reports that the knee is still about the same as last visit.  She notes continued limitation in knee flexion.  She had a good trial of physical therapy and with her orthopedic surgeon had a trial of manipulation under anesthesia that did not help improve her pain and range of motion of her knee.  Dx testing: 08/28/22 3-phase bone scan  03/23/22 R knee XR  11/20/21 R knee XR  08/27/21 R knee XR  Pertinent review of systems: No fevers or chills.  Relevant historical information: Hypertension. R TKA w/ Dr. Lyla Glassing on 08/27/21 and a R knee MUA on 11/20/21  Retrial of formal physical therapy ended June 03, 2022.  Exam:  BP 122/80   Pulse 81   Ht '5\' 1"'$  (1.549 m)   Wt 140 lb (63.5 kg)   SpO2 97%   BMI 26.45 kg/m  General: Well Developed, well nourished, and in no acute distress.   MSK: Right knee mature scar anterior knee.  Minimal joint effusion.  Decreased range of motion limited flexion to about 100 degrees.    Lab and Radiology Results   EXAM: RIGHT KNEE 3 VIEWS   COMPARISON:  Right knee radiographs 11/20/2021   FINDINGS: Status post total right knee arthroplasty. No perihardware lucency is seen to indicate hardware failure or loosening. Small knee joint effusion. No acute fracture or dislocation.   IMPRESSION: Status post total right knee arthroplasty without evidence of hardware failure.     Electronically Signed   By: Yvonne Kendall M.D.   On: 03/23/2022 17:34   I, Lynne Leader, personally (independently) visualized and performed the interpretation of the images attached in this note.  EXAM: NUCLEAR MEDICINE 3-PHASE  BONE SCAN   TECHNIQUE: Radionuclide angiographic images, immediate static blood pool images, and 3-hour delayed static images were obtained of the knees after intravenous injection of radiopharmaceutical.   RADIOPHARMACEUTICALS:  20.8 mCi Tc-51mMDP IV   COMPARISON:  None   Radiographic correlation: 03/23/2022 RIGHT knee radiographs   FINDINGS: Vascular phase: Increased blood flow to the periarticular regions of RIGHT knee. Normal blood flow LEFT knee.   Blood pool phase: Increased blood pool at periarticular regions of RIGHT knee. Minimally increased blood pool at medial joint line LEFT knee.   Delayed phase: Uptake of tracer at medial compartment LEFT knee consistent with degenerative changes. Photopenic defect RIGHT knee from prosthetic components. Diffuse uptake of tracer throughout the femoral condyles and proximal tibia as well as patella. No focal areas of abnormal tracer localization are seen to suggest aseptic loosening. Observed pattern may reflect postoperative change though cannot entirely exclude infection.   IMPRESSION: Increased blood flow and blood pool of tracer surrounding the RIGHT knee consistent with hyperemia question synovitis.   Diffuse delayed uptake of tracer surrounding the RIGHT knee joint, pattern atypical for aseptic loosening; this may reflect postoperative changes though infection is not entirely excluded.     Electronically Signed   By: MLavonia DanaM.D.   On: 08/28/2022 16:51  I, ELynne Leader personally (independently) visualized and performed the interpretation of the images attached in this note.  Assessment and Plan: 66 y.o. female with right knee pain and lack of range of motion.  Been over a year since her knee replacement and she has had an extensive trial of physical therapy including manipulation under anesthesia.  She has failed to improve significantly.  She had a three-phase bone scan recently that was not conclusively  normal but not inclusively abnormal either.  I think it is worth her time to have a consultation with a orthopedic surgeon about her potential next steps. I am not sure what else I can do to help her. Orthopedic surgery consultation ordered today.  PDMP not reviewed this encounter. Orders Placed This Encounter  Procedures   Ambulatory referral to Orthopedic Surgery    Referral Priority:   Routine    Referral Type:   Surgical    Referral Reason:   Specialty Services Required    Requested Specialty:   Orthopedic Surgery    Number of Visits Requested:   1   No orders of the defined types were placed in this encounter.    Discussed warning signs or symptoms. Please see discharge instructions. Patient expresses understanding.   The above documentation has been reviewed and is accurate and complete Lynne Leader, M.D.

## 2022-09-03 NOTE — Progress Notes (Signed)
Three-phase bone scan is inconclusive.  It does increased activity around the replaced knee.  The radiologist is not confident that this indicates that there is loosening or infection.  Both are less likely.  Recommend return to clinic to talk about the results in full detail and to talk about potential next steps.

## 2022-09-08 ENCOUNTER — Ambulatory Visit: Payer: 59 | Admitting: Family Medicine

## 2022-09-08 VITALS — BP 122/80 | HR 81 | Ht 61.0 in | Wt 140.0 lb

## 2022-09-08 DIAGNOSIS — M25561 Pain in right knee: Secondary | ICD-10-CM | POA: Diagnosis not present

## 2022-09-08 DIAGNOSIS — M25562 Pain in left knee: Secondary | ICD-10-CM | POA: Diagnosis not present

## 2022-09-08 DIAGNOSIS — G8929 Other chronic pain: Secondary | ICD-10-CM

## 2022-09-08 NOTE — Patient Instructions (Signed)
Thank you for coming in today.   Plan for orthopedic surgery evaluation.   You should hear soon about the appointment.

## 2022-09-10 ENCOUNTER — Ambulatory Visit (INDEPENDENT_AMBULATORY_CARE_PROVIDER_SITE_OTHER): Payer: 59 | Admitting: Orthopedic Surgery

## 2022-09-10 DIAGNOSIS — Z96651 Presence of right artificial knee joint: Secondary | ICD-10-CM

## 2022-09-11 LAB — CBC WITH DIFFERENTIAL/PLATELET
Absolute Monocytes: 448 cells/uL (ref 200–950)
Basophils Absolute: 49 cells/uL (ref 0–200)
Basophils Relative: 0.7 %
Eosinophils Absolute: 210 cells/uL (ref 15–500)
Eosinophils Relative: 3 %
HCT: 35.6 % (ref 35.0–45.0)
Hemoglobin: 11.9 g/dL (ref 11.7–15.5)
Lymphs Abs: 2100 cells/uL (ref 850–3900)
MCH: 27 pg (ref 27.0–33.0)
MCHC: 33.4 g/dL (ref 32.0–36.0)
MCV: 80.9 fL (ref 80.0–100.0)
MPV: 11.7 fL (ref 7.5–12.5)
Monocytes Relative: 6.4 %
Neutro Abs: 4193 cells/uL (ref 1500–7800)
Neutrophils Relative %: 59.9 %
Platelets: 336 10*3/uL (ref 140–400)
RBC: 4.4 10*6/uL (ref 3.80–5.10)
RDW: 14.3 % (ref 11.0–15.0)
Total Lymphocyte: 30 %
WBC: 7 10*3/uL (ref 3.8–10.8)

## 2022-09-11 LAB — C-REACTIVE PROTEIN: CRP: 23.3 mg/L — ABNORMAL HIGH (ref <8.0)

## 2022-09-11 LAB — SEDIMENTATION RATE: Sed Rate: 48 mm/h — ABNORMAL HIGH (ref 0–30)

## 2022-09-12 ENCOUNTER — Encounter: Payer: Self-pay | Admitting: Orthopedic Surgery

## 2022-09-12 NOTE — Progress Notes (Signed)
Labs slightly elevated needs to repeat in 8 weeks - not enough for aspiration

## 2022-09-12 NOTE — Progress Notes (Signed)
Office Visit Note   Patient: [name] [name]           Date of Birth: [birth year]           MRN: [id number] Visit Date: 09/10/2022 Requested by: Gregor Hams, MD [place],  [place] [id number] PCP: Laurey Morale, MD  Subjective: Chief Complaint  Patient presents with   Right Knee - Pain    HPI: [name] [name] is a 67 y.o. female who presents to the office reporting right knee pain.  The patient underwent right total knee replacement using press-fit prosthesis 08/27/2021 by a physician at [facility].  Patient had end-stage knee arthritis prior to the procedure but her pain did not really improve after the procedure.  She went to physical therapy without improvement.  Physical therapy lasted about 15 weeks.  Subsequently underwent manipulation under anesthesia on 11/20/2021 with no significant clinical improvement or improvement in her pain.  Patient states that it was hard for her to straighten the leg after surgery.  She states that the knee feels like it will give way.  Denies any fevers or chills.  Denies any back pain.  Denies any groin pain..                ROS: All systems reviewed are negative as they relate to the chief complaint within the history of present illness.  Patient denies fevers or chills.  Assessment & Plan: Visit Diagnoses:  1. S/P total knee arthroplasty, right     Plan: Impression is right knee pain unclear etiology.  Radiographs from 5 months ago demonstrate prosthesis in good position alignment with no complicating features.  Currently her range of motion is 10 to 80 degrees.  Knee itself is not warm.  Bone scan performed demonstrates increased blood flow around the right knee consistent with possible synovitis.  It is a little bit early for bone scans to have much clinical meaning.  Plan at this time is I have very long discussion with [name] [name] about risk and benefits of revision surgery in this scenario.  I think it is possible that she could end up  worse with revising the implants which appear to be well-fixed at this time.  Infection is another possibility.  Does not really look infected based on the clinical appearance of the knee with relatively the same temperature as well as no effusion.  Nonetheless we will draw CBC DIF sed rate C-reactive protein today.  At the time of this dictation the sed rate was elevated in the 40 range and the C-reactive protein was approximately 8.  These are elevated but not to the degree that I typically see with infected knees and not quite to the degree that I would risk putting a needle into the knee without an effusion.  Plan is to come back in 2 months so we can redraw labs and check the trend on the infection parameters.  If it is going up I would favor an aspiration of the knee at that time.  I think it is entirely possible that [name] [name] will have to decide between living with what she has versus undergoing a revision procedure which in the literature and in my experience has a coin toss 50-50 chance of improvement with a distinct possibility of infection and subsequent significant increase in morbidity dealing with that potential complication.  Follow-Up Instructions: No follow-ups on file.   Orders:  Orders Placed This Encounter  Procedures   CBC with Differential  C-reactive protein   Sed Rate (ESR)   No orders of the defined types were placed in this encounter.     Procedures: No procedures performed   Clinical Data: No additional findings.  Objective: Vital Signs: There were no vitals taken for this visit.  Physical Exam:  Constitutional: Patient appears well-developed HEENT:  Head: Normocephalic Eyes:EOM are normal Neck: Normal range of motion Cardiovascular: Normal rate Pulmonary/chest: Effort normal Neurologic: Patient is alert Skin: Skin is warm Psychiatric: Patient has normal mood and affect  Ortho Exam: Ortho exam demonstrates no nerve root tension signs on the right no  groin pain with internal/external rotation of the leg.  Range of motion is about 10-80.  Patella mobility is predictably decreased.  Collaterals are stable to varus valgus stress at 0 30 and 80 degrees.  No real warmth difference right knee versus left knee. No skin color difference or temperature difference right knee versus left knee indicating low likelihood of RSD. Specialty Comments:  No specialty comments available.  Imaging: No results found.   PMFS History: Patient Active Problem List   Diagnosis Date Noted   Primary hypertension 03/09/2022   Osteoarthritis of right knee 08/27/2021   S/P total knee arthroplasty, 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   COVID-19 virus infection 04/09/2021   Dyslipidemia 03/31/2021   Urinary frequency 10/29/2017   Diabetes mellitus without complication ([place]) 27/78/2423   Special screening for malignant neoplasms, colon 11/25/2011   Diverticulosis of colon (without mention of hemorrhage) 11/25/2011   URTICARIA 01/31/2010   ECZEMA 05/03/2009   Past Medical History:  Diagnosis Date   Arthritis    Complication of anesthesia    Diabetes mellitus without complication (HCC)    PONV (postoperative nausea and vomiting)    Ruptured ovarian cyst     Family History  Problem Relation Age of Onset   Cancer Other        breast cancer   Colon cancer Neg Hx    Esophageal cancer Neg Hx    Stomach cancer Neg Hx    Rectal cancer Neg Hx     Past Surgical History:  Procedure Laterality Date   CESAREAN SECTION     COLONOSCOPY  06/05/2019   per Dr. Tarri Glenn, single adenomatous polyp, repeat in 7 yrs   KNEE ARTHROPLASTY Right 08/27/2021   Procedure: COMPUTER ASSISTED TOTAL KNEE ARTHROPLASTY;  Surgeon: Rod Can, MD;  Location: [facility];  Service: Orthopedics;  Laterality: Right;   KNEE CLOSED REDUCTION Right 11/20/2021   Procedure: CLOSED MANIPULATION KNEE;  Surgeon: Rod Can, MD;  Location: [facility];  Service: Orthopedics;  Laterality: Right;   lumpectomy,benign,  right breast     x2   RIGHT SHOULDER SURGERY      Social History   Occupational History   Occupation: food service  Tobacco Use   Smoking status: Never   Smokeless tobacco: Never  Vaping Use   Vaping Use: Never used  Substance and Sexual Activity   Alcohol use: Yes    Alcohol/week: 0.0 standard drinks of alcohol    Comment: maybe twice a month   Drug use: No   Sexual activity: Not on file

## 2022-09-14 ENCOUNTER — Telehealth: Payer: Self-pay

## 2022-09-14 ENCOUNTER — Telehealth: Payer: Self-pay | Admitting: Orthopedic Surgery

## 2022-09-14 NOTE — Telephone Encounter (Signed)
-----   Message from Meredith Pel, MD sent at 09/12/2022  6:08 PM EST ----- Labs slightly elevated needs to repeat in 8 weeks - not enough for aspiration

## 2022-09-14 NOTE — Telephone Encounter (Signed)
Patient called concerning her lab results. Patient asked for a call back as soon as possible. The number to contact patient is [phone]

## 2022-09-14 NOTE — Telephone Encounter (Signed)
Tried calling patient to discuss results, no answer. Will try to reach patient again later to advise per Dr Marlou Sa and make 6 week follow up appt

## 2022-09-14 NOTE — Telephone Encounter (Signed)
See other note

## 2022-09-14 NOTE — Telephone Encounter (Signed)
Patient notified. Follow up appt scheduled.

## 2022-10-05 ENCOUNTER — Ambulatory Visit: Payer: 59 | Admitting: Family Medicine

## 2022-10-05 VITALS — BP 130/60 | HR 70 | Temp 98.8°F | Ht 61.0 in | Wt 145.8 lb

## 2022-10-05 DIAGNOSIS — E1165 Type 2 diabetes mellitus with hyperglycemia: Secondary | ICD-10-CM

## 2022-10-05 DIAGNOSIS — R739 Hyperglycemia, unspecified: Secondary | ICD-10-CM

## 2022-10-05 DIAGNOSIS — M25561 Pain in right knee: Secondary | ICD-10-CM

## 2022-10-05 DIAGNOSIS — R300 Dysuria: Secondary | ICD-10-CM | POA: Diagnosis not present

## 2022-10-05 DIAGNOSIS — R81 Glycosuria: Secondary | ICD-10-CM

## 2022-10-05 DIAGNOSIS — G8929 Other chronic pain: Secondary | ICD-10-CM

## 2022-10-05 LAB — POC URINALSYSI DIPSTICK (AUTOMATED)
Bilirubin, UA: NEGATIVE
Blood, UA: POSITIVE
Glucose, UA: POSITIVE — AB
Ketones, UA: POSITIVE
Leukocytes, UA: NEGATIVE
Nitrite, UA: NEGATIVE
Protein, UA: POSITIVE — AB
Urobilinogen, UA: 0.2 E.U./dL
pH, UA: 5 (ref 5.0–8.0)

## 2022-10-05 LAB — GLUCOSE, POCT (MANUAL RESULT ENTRY): POC Glucose: 248 mg/dl — AB (ref 70–99)

## 2022-10-05 NOTE — Progress Notes (Signed)
Established Patient Office Visit   Subjective  Patient ID: [name] [name], female    DOB: [birth year]  Age: 67 y.o. MRN: [id number]  No chief complaint on file.   Patient is a 67 year old female who presents with acute concern of dysuria, urinary frequency that started on Saturday (2 days ago.  Patient had expired (2020) ciprofloxacin that she started taking.  Patient denies burning, itching.  Patient taking metformin 1000 mg twice daily for history of DM.  States had a big breakfast this morning.  Had [place] food with rice and lo mein noodles last night for dinner.  No recent steroid use.  Patient endorses right knee pain and edema since surgery December 2022.  Some increased warmth and sensation of water running down inside of leg.  Seen by sports medicine.  Had second opinion with Ortho.  Concern for infection in joint per patient.  States had labs which are being monitored.  Next appointment at the end of February.      ROS Negative unless stated above    Objective:     BP 130/60 (BP Location: Right Arm, Cuff Size: Normal)   Pulse 70   Temp 98.8 F (37.1 C) (Oral)   Ht '5\' 1"'$  (1.549 m)   Wt 145 lb 12.8 oz (66.1 kg)   SpO2 97%   BMI 27.55 kg/m    Physical Exam Constitutional:      Appearance: Normal appearance.  HENT:     Head: Normocephalic and atraumatic.     Mouth/Throat:     Mouth: Mucous membranes are moist.  Cardiovascular:     Rate and Rhythm: Normal rate and regular rhythm.     Pulses: Normal pulses.     Heart sounds: Normal heart sounds.  Pulmonary:     Effort: Pulmonary effort is normal.     Breath sounds: Normal breath sounds.  Abdominal:     Palpations: Abdomen is soft.     Tenderness: There is no abdominal tenderness. There is no right CVA tenderness or left CVA tenderness.  Musculoskeletal:     Comments: Decreased ROM of R knee.  Mild edema of R knee.  Skin:    General: Skin is warm and dry.  Neurological:     Mental Status: She is alert.       Results for orders placed or performed in visit on 10/05/22  POCT Urinalysis Dipstick (Automated)  Result Value Ref Range   Color, UA yellow    Clarity, UA Clear    Glucose, UA Positive (A) Negative   Bilirubin, UA negative    Ketones, UA positive    Spec Grav, UA     Blood, UA Positive    pH, UA 5.0 5.0 - 8.0   Protein, UA Positive (A) Negative   Urobilinogen, UA 0.2 0.2 or 1.0 E.U./dL   Nitrite, UA Negative    Leukocytes, UA Negative Negative  POC Glucose (CBG)  Result Value Ref Range   POC Glucose 248 (A) 70 - 99 mg/dl      Assessment & Plan:  Dysuria -No leuks on UA.  Advised recent ABX use remain altered and urine sample. -Patient encouraged to increase p.o. intake of water and fluids -Advised glucose urea likely contributing to symptoms.  Discussed importance of glycemic control. -     POCT Urinalysis Dipstick (Automated)  Hyperglycemia  Type 2 diabetes mellitus with hyperglycemia, without long-term current use of insulin (HCC) -FSBS 248 in clinic -Patient has yet to take a.m. dose of metformin. -  Discussed the importance of decreasing carb intake. -Will have patient follow-up with PCP for med adjustments -     POCT glucose (manual entry)  Glucosuria -3+ glucose on UA. -     POCT glucose (manual entry)  Chronic pain of right knee -Continue follow-up with sports med and Ortho   Return in about 1 week (around 10/12/2022) for diabetes.   Billie Ruddy, MD

## 2022-10-05 NOTE — Patient Instructions (Signed)
Make sure you are drinking more water and fluids to help bring down your blood sugar.  Also make sure you are decreasing the amount of carbohydrates you are eating.  Carbohydrates include rice, pasta, potatoes, bread.  It appears that your PCP has openings next week on Monday.  We can have you scheduled to follow-up on these ongoing concerns.

## 2022-10-26 ENCOUNTER — Encounter: Payer: Self-pay | Admitting: Family Medicine

## 2022-10-26 ENCOUNTER — Ambulatory Visit: Payer: 59 | Admitting: Family Medicine

## 2022-10-26 ENCOUNTER — Other Ambulatory Visit: Payer: 59

## 2022-10-26 VITALS — BP 128/78 | HR 70 | Temp 98.0°F | Wt 140.0 lb

## 2022-10-26 DIAGNOSIS — I10 Essential (primary) hypertension: Secondary | ICD-10-CM

## 2022-10-26 DIAGNOSIS — E119 Type 2 diabetes mellitus without complications: Secondary | ICD-10-CM | POA: Diagnosis not present

## 2022-10-26 LAB — POCT GLYCOSYLATED HEMOGLOBIN (HGB A1C): Hemoglobin A1C: 7 % — AB (ref 4.0–5.6)

## 2022-10-26 MED ORDER — SITAGLIPTIN PHOSPHATE 50 MG PO TABS: 50.0000 mg | ORAL_TABLET | Freq: Every day | ORAL | 2 refills | Status: DC

## 2022-10-26 NOTE — Progress Notes (Signed)
   Subjective:    Patient ID: [name] [name], female    DOB: 1956-03-14, 67 y.o.   MRN: [id number]  HPI Here to follow up on diabetes. She feels well in general, but she hqs been dealing with pain in the right knee. She saw Dr. Marlou Sa for this last month, and a bone scan indicated she may have a synovitis. Her WBC was normal, but the ESR and CRP were mildly elevated. She wil follow up with Dr. Jobe Igo a few weeks. She has been taking Metformin and she has been watching her diet closely. Today her A1c is down to 7.0%.    Review of Systems  Constitutional: Negative.   Respiratory: Negative.    Cardiovascular: Negative.   Musculoskeletal:  Positive for arthralgias.       Objective:   Physical Exam Constitutional:      Appearance: Normal appearance.  Cardiovascular:     Rate and Rhythm: Normal rate and regular rhythm.     Pulses: Normal pulses.     Heart sounds: Normal heart sounds.  Pulmonary:     Effort: Pulmonary effort is normal.     Breath sounds: Normal breath sounds.  Neurological:     Mental Status: She is alert.           Assessment & Plan:  Her diabetes is under borderline control. We will add Januvia 50 mg daily to the Metformin. Recheck here in 4 weeks.  Alysia Penna, MD

## 2022-11-02 ENCOUNTER — Ambulatory Visit
Admission: RE | Admit: 2022-11-02 | Discharge: 2022-11-02 | Disposition: A | Discharge status: Home / Self Care | Payer: 59 | Source: Ambulatory Visit | Attending: Orthopedic Surgery | Admitting: Orthopedic Surgery

## 2022-11-02 ENCOUNTER — Encounter: Payer: Self-pay | Admitting: Orthopedic Surgery

## 2022-11-02 ENCOUNTER — Ambulatory Visit: Payer: 59 | Admitting: Orthopedic Surgery

## 2022-11-02 DIAGNOSIS — M25561 Pain in right knee: Secondary | ICD-10-CM

## 2022-11-02 DIAGNOSIS — Z96651 Presence of right artificial knee joint: Secondary | ICD-10-CM

## 2022-11-02 NOTE — Progress Notes (Signed)
Office Visit Note   Patient: [name] [name]           Date of Birth: 12-Apr-1956           MRN: [id number] Visit Date: 11/02/2022 Requested by: Laurey Morale, MD [place],  [place] [id number] PCP: Laurey Morale, MD  Subjective: Chief Complaint  Patient presents with   Right Knee - Follow-up    HPI: [name] [name] is a 67 y.o. female who presents to the office reporting right knee pain.  She underwent right total knee replacement press-fit by physicians at [facility] 08/27/2021.  Underwent manipulation as well.  She had some blood work done last clinic visit here where the labs were slightly elevated.  She denies any fevers or chills.  States that the knee does hurt every step but the bigger problem she is having is really with knee flexion.  She works at the [employer].  Mobic and Tylenol helps her symptoms.  Does have pain more in the front of the knee than the back of the knee.  She was out of work for months after her surgery.  Stairs are difficult for her.  The loss of flexion is a bigger problem than the loss of extension..                ROS: All systems reviewed are negative as they relate to the chief complaint within the history of present illness.  Patient denies fevers or chills.  Assessment & Plan: Visit Diagnoses:  1. S/P total knee arthroplasty, right   2. Right knee pain, unspecified chronicity     Plan: Impression is right knee pain with well-fixed components on plain radiograph.  Plan is CT scan to evaluate for any type of micromotion at the implant bone interface.  Redraw CBC DIF sed rate C-reactive protein today to see if the trend is up or down.  If it is up I think we will have to aspirate the knee next clinic visit.  If it is trending down then the decision will be for or against arthroscopic intervention try to free up  Follow-Up Instructions: No follow-ups on file.   Orders:  Orders Placed This Encounter  Procedures   CT KNEE RIGHT WO  CONTRAST   CBC with Differential   Sed Rate (ESR)   C-reactive protein   No orders of the defined types were placed in this encounter.     Procedures: No procedures performed   Clinical Data: No additional findings.  Objective: Vital Signs: There were no vitals taken for this visit.  Physical Exam:  Constitutional: Patient appears well-developed HEENT:  Head: Normocephalic Eyes:EOM are normal Neck: Normal range of motion Cardiovascular: Normal rate Pulmonary/chest: Effort normal Neurologic: Patient is alert Skin: Skin is warm Psychiatric: Patient has normal mood and affect  Ortho Exam: Of scar tissue around the extensor mechanism which would be in the retropatellar tendon region as well as the medial and lateral gutters.  Did talk to her about the risk of any intervention causing infection in this knee which would carry a very small risk of amputation if it could not be eradicated.  She states that she cannot really lift the pain the way it is now.  Will see her back after that CT scan.  Specialty Comments:  No specialty comments available.  Imaging: No results found.   PMFS History: Patient Active Problem List   Diagnosis Date Noted   Primary hypertension 03/09/2022  Osteoarthritis of right knee 08/27/2021   S/P total knee arthroplasty, 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   COVID-19 virus infection 04/09/2021   Dyslipidemia 03/31/2021   Urinary frequency 10/29/2017   Diabetes mellitus without complication ([place]) 123456   Special screening for malignant neoplasms, colon 11/25/2011   Diverticulosis of colon (without mention of hemorrhage) 11/25/2011   URTICARIA 01/31/2010   ECZEMA 05/03/2009   Past Medical History:  Diagnosis Date   Arthritis    Complication of anesthesia    Diabetes mellitus without complication (HCC)    PONV (postoperative nausea and vomiting)    Ruptured ovarian cyst     Family History  Problem Relation Age of Onset   Cancer Other        breast  cancer   Colon cancer Neg Hx    Esophageal cancer Neg Hx    Stomach cancer Neg Hx    Rectal cancer Neg Hx     Past Surgical History:  Procedure Laterality Date   CESAREAN SECTION     COLONOSCOPY  06/05/2019   per Dr. Tarri Glenn, single adenomatous polyp, repeat in 7 yrs   KNEE ARTHROPLASTY Right 08/27/2021   Procedure: COMPUTER ASSISTED TOTAL KNEE ARTHROPLASTY;  Surgeon: Rod Can, MD;  Location: [facility];  Service: Orthopedics;  Laterality: Right;   KNEE CLOSED REDUCTION Right 11/20/2021   Procedure: CLOSED MANIPULATION KNEE;  Surgeon: Rod Can, MD;  Location: [facility];  Service: Orthopedics;  Laterality: Right;   lumpectomy,benign, right breast     x2   RIGHT SHOULDER SURGERY      Social History   Occupational History   Occupation: food service  Tobacco Use   Smoking status: Never   Smokeless tobacco: Never  Vaping Use   Vaping Use: Never used  Substance and Sexual Activity   Alcohol use: Yes    Alcohol/week: 0.0 standard drinks of alcohol    Comment: maybe twice a month   Drug use: No   Sexual activity: Not on file

## 2022-11-03 LAB — CBC WITH DIFFERENTIAL/PLATELET
Absolute Monocytes: 412 {cells}/uL (ref 200–950)
Basophils Absolute: 50 {cells}/uL (ref 0–200)
Basophils Relative: 0.6 %
Eosinophils Absolute: 218 {cells}/uL (ref 15–500)
Eosinophils Relative: 2.6 %
HCT: 36.2 % (ref 35.0–45.0)
Hemoglobin: 12.1 g/dL (ref 11.7–15.5)
Lymphs Abs: 1730 {cells}/uL (ref 850–3900)
MCH: 27.2 pg (ref 27.0–33.0)
MCHC: 33.4 g/dL (ref 32.0–36.0)
MCV: 81.3 fL (ref 80.0–100.0)
MPV: 12.4 fL (ref 7.5–12.5)
Monocytes Relative: 4.9 %
Neutro Abs: 5989 {cells}/uL (ref 1500–7800)
Neutrophils Relative %: 71.3 %
Platelets: 332 Thousand/uL (ref 140–400)
RBC: 4.45 Million/uL (ref 3.80–5.10)
RDW: 14.4 % (ref 11.0–15.0)
Total Lymphocyte: 20.6 %
WBC: 8.4 Thousand/uL (ref 3.8–10.8)

## 2022-11-03 LAB — SEDIMENTATION RATE: Sed Rate: 41 mm/h — ABNORMAL HIGH (ref 0–30)

## 2022-11-03 LAB — C-REACTIVE PROTEIN: CRP: 13.8 mg/L — ABNORMAL HIGH

## 2022-11-20 NOTE — Progress Notes (Signed)
Pls set appt up for this person with [facility] next week thx for knee asp

## 2022-11-26 ENCOUNTER — Encounter: Payer: Self-pay | Admitting: Surgical

## 2022-11-26 ENCOUNTER — Ambulatory Visit: Payer: 59 | Admitting: Surgical

## 2022-11-26 DIAGNOSIS — M25461 Effusion, right knee: Secondary | ICD-10-CM

## 2022-11-26 DIAGNOSIS — Z96651 Presence of right artificial knee joint: Secondary | ICD-10-CM

## 2022-11-26 MED ORDER — LIDOCAINE HCL 1 % IJ SOLN
5.0000 mL | INTRAMUSCULAR | Status: AC | PRN
  Administered 2022-11-26: 5 mL

## 2022-11-26 MED ORDER — BUPIVACAINE HCL 0.25 % IJ SOLN
4.0000 mL | INTRAMUSCULAR | Status: AC | PRN
  Administered 2022-11-26: 4 mL via INTRA_ARTICULAR

## 2022-11-26 MED ORDER — METHYLPREDNISOLONE ACETATE 40 MG/ML IJ SUSP
40.0000 mg | INTRAMUSCULAR | Status: AC | PRN
  Administered 2022-11-26: 40 mg via INTRA_ARTICULAR

## 2022-11-26 NOTE — Progress Notes (Signed)
Follow-up Office Visit Note   Patient: [name] [name]           Date of Birth: [birth year]           MRN: [id number] Visit Date: 11/26/2022 Requested by: Laurey Morale, MD [place],  [place] [address] PCP: Laurey Morale, MD  Subjective: Chief Complaint  Patient presents with   Right Knee - Follow-up    HPI: [name] [name] is a 67 y.o. female who returns to the office for follow-up visit.    Plan at last visit was: Impression is right knee pain with well-fixed components on plain radiograph. Plan is CT scan to evaluate for any type of micromotion at the implant bone interface. Redraw CBC DIF sed rate C-reactive protein today to see if the trend is up or down. If it is up I think we will have to aspirate the knee next clinic visit. If it is trending down then the decision will be for or against arthroscopic intervention try to free up   Since then, patient notes she has continued pain with no change in symptoms.  She continues to deny any fevers or chills.  She has had fairly constant pain following her knee replacement by an outside Psychologist, sport and exercise.              ROS: All systems reviewed are negative as they relate to the chief complaint within the history of present illness.  Patient denies fevers or chills.  Assessment & Plan: Visit Diagnoses:  1. S/P total knee arthroplasty, right     Plan: [name] [name] is a 67 y.o. female who returns to the office for follow-up visit for right knee pain.  Plan from last visit was noted above in HPI.  They now return with continued elevation of ESR and CRP from blood work that was done several weeks ago.  She has CT scan demonstrating no evidence of infection or loosening of the implant.  Plan today is aspiration of the right knee.  The right knee was prepped with commendation of alcohol, Betadine, ChloraPrep.  Sterile gloves were utilized and the right knee was aspirated of about 4 cc of serosanguineous fluid.  Will send this off for synovial  fluid analysis and anaerobic/aerobic culture.  Plan to call patient with results  Follow-Up Instructions: No follow-ups on file.   Orders:  Orders Placed This Encounter  Procedures   Anaerobic and Aerobic Culture   Synovial Fluid Analysis, Complete   No orders of the defined types were placed in this encounter.     Procedures: Large Joint Inj: R knee on 11/26/2022 5:33 PM Indications: diagnostic evaluation, joint swelling and pain Details: 18 G 1.5 in needle, superolateral approach  Arthrogram: No  Medications: 5 mL lidocaine 1 %; 40 mg methylPREDNISolone acetate 40 MG/ML; 4 mL bupivacaine 0.25 % Aspirate: 4 mL blood-tinged Outcome: tolerated well, no immediate complications Procedure, treatment alternatives, risks and benefits explained, specific risks discussed. Consent was given by the patient. Immediately prior to procedure a time out was called to verify the correct patient, procedure, equipment, support staff and site/side marked as required. Patient was prepped and draped in the usual sterile fashion.       Clinical Data: No additional findings.  Objective: Vital Signs: There were no vitals taken for this visit.  Physical Exam:  Constitutional: Patient appears well-developed HEENT:  Head: Normocephalic Eyes:EOM are normal Neck: Normal range of motion Cardiovascular: Normal rate Pulmonary/chest: Effort normal Neurologic: Patient is alert  Skin: Skin is warm Psychiatric: Patient has normal mood and affect  Ortho Exam: Ortho exam demonstrates right knee with trace effusion.  She has about 3 degrees extension and 50 degrees of knee flexion today in clinic.  No calf tenderness.  Negative Homans' sign.  Able to perform straight leg raise.  Well-healed incision from prior total knee arthroplasty.  No cellulitis or skin changes noted.  No asymmetric warmth.  No sinus tract noted.  Specialty Comments:  No specialty comments available.  Imaging: No results  found.   PMFS History: Patient Active Problem List   Diagnosis Date Noted   Primary hypertension 03/09/2022   Osteoarthritis of right knee 08/27/2021   S/P total knee arthroplasty, 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   COVID-19 virus infection 04/09/2021   Dyslipidemia 03/31/2021   Urinary frequency 10/29/2017   Diabetes mellitus without complication ([facility]) 123456   Special screening for malignant neoplasms, colon 11/25/2011   Diverticulosis of colon (without mention of hemorrhage) 11/25/2011   URTICARIA 01/31/2010   ECZEMA 05/03/2009   Past Medical History:  Diagnosis Date   Arthritis    Complication of anesthesia    Diabetes mellitus without complication (HCC)    PONV (postoperative nausea and vomiting)    Ruptured ovarian cyst     Family History  Problem Relation Age of Onset   Cancer Other        breast cancer   Colon cancer Neg Hx    Esophageal cancer Neg Hx    Stomach cancer Neg Hx    Rectal cancer Neg Hx     Past Surgical History:  Procedure Laterality Date   CESAREAN SECTION     COLONOSCOPY  06/05/2019   per Dr. Tarri Glenn, single adenomatous polyp, repeat in 7 yrs   KNEE ARTHROPLASTY Right 08/27/2021   Procedure: COMPUTER ASSISTED TOTAL KNEE ARTHROPLASTY;  Surgeon: Rod Can, MD;  Location: [facility];  Service: Orthopedics;  Laterality: Right;   KNEE CLOSED REDUCTION Right 11/20/2021   Procedure: CLOSED MANIPULATION KNEE;  Surgeon: Rod Can, MD;  Location: [facility];  Service: Orthopedics;  Laterality: Right;   lumpectomy,benign, right breast     x2   RIGHT SHOULDER SURGERY      Social History   Occupational History   Occupation: food service  Tobacco Use   Smoking status: Never   Smokeless tobacco: Never  Vaping Use   Vaping Use: Never used  Substance and Sexual Activity   Alcohol use: Yes    Alcohol/week: 0.0 standard drinks of alcohol    Comment: maybe twice a month   Drug use: No   Sexual activity: Not on file

## 2022-12-02 LAB — ANAEROBIC AND AEROBIC CULTURE
AER RESULT:: NO GROWTH
MICRO NUMBER:: 14724409
MICRO NUMBER:: 14724410
SPECIMEN QUALITY:: ADEQUATE
SPECIMEN QUALITY:: ADEQUATE

## 2022-12-02 LAB — SYNOVIAL FLUID ANALYSIS, COMPLETE
Basophils, %: 0 %
Eosinophils-Synovial: 0 % (ref 0–2)
Lymphocytes-Synovial Fld: 64 % (ref 0–74)
Monocyte/Macrophage: 7 % (ref 0–69)
Neutrophil, Synovial: 29 % — ABNORMAL HIGH (ref 0–24)
Synoviocytes, %: 0 % (ref 0–15)
WBC, Synovial: 324 cells/uL — ABNORMAL HIGH (ref <150)

## 2022-12-02 NOTE — Progress Notes (Signed)
No infxn. Can you make appointment for her to follow-up with Dr Marlou Sa to discuss next steps?

## 2022-12-21 ENCOUNTER — Ambulatory Visit: Payer: 59 | Admitting: Surgical

## 2022-12-24 ENCOUNTER — Encounter: Payer: Self-pay | Admitting: Orthopedic Surgery

## 2022-12-24 ENCOUNTER — Ambulatory Visit: Payer: 59 | Admitting: Orthopedic Surgery

## 2022-12-24 DIAGNOSIS — Z96651 Presence of right artificial knee joint: Secondary | ICD-10-CM | POA: Diagnosis not present

## 2022-12-24 NOTE — Progress Notes (Addendum)
Office Visit Note   Patient: [name] [name]           Date of Birth: 10-16-1955           MRN: [id number] Visit Date: 12/24/2022 Requested by: Nelwyn Salisbury, MD [address] [place],  [place] [address] PCP: Nelwyn Salisbury, MD  Subjective: Chief Complaint  Patient presents with   Right Knee - Follow-up    HPI: [name] [name] is a 67 y.o. female who presents to the office reporting right knee pain.  She underwent press-fit right total knee replacement about a year and a half ago.  Taking meloxicam and Advil for symptoms.  Extensive workup has been performed.  Bone scan predictably shows increased uptake of uncertain significance.  CT scan unremarkable and specifically negative for any type of prosthetic loosening.  She has had a knee aspiration which was negative.  Laboratory values only slightly elevated with sed rate of 40 and C-reactive protein of 13.  Overall she is having significant pain in the knee on a daily basis and this is pain that she cannot live with.  She also reports low back pain which is a new finding for her compared to her initial visit 6 months ago.  Describes buttock pain and potential radicular type symptoms which have been ongoing now for at least 2 months.  She has had extensive physical therapy as well as manipulation under anesthesia and at her knee continues to be symptomatic.  Denies any groin pain.              ROS: All systems reviewed are negative as they relate to the chief complaint within the history of present illness.  Patient denies fevers or chills.  Assessment & Plan: Visit Diagnoses:  1. S/P total knee arthroplasty, right     Plan: Impression is right knee pain following total knee replacement.  Does not really look infected.  No evidence of chronic reactive pain syndrome.  She does have a stiff knee.  Discussed with her at this clinic visit as well as other clinic visits that revisions for pain are at best a 50-50 proposition.  If we do find acute  inflammation at the time of the revision then the plan would be antibiotic cement spacer interposition with treatment in the next 6 to 8 weeks with IV and oral antibiotics.  And in that scenario I think it is possible improbable that no organism would be identified but that acute inflammation could be present which would necessarily delay final revision surgery.  The risk and benefits of the revision surgery including not limited to infection or vessel damage continued knee stiffness incomplete pain relief as well as necessity for further revision in her lifetime were all discussed.  All questions answered.  I do want to get an MRI of the lumbar spine to evaluate for right-sided disc herniation.  Plain radiographs obtained within the last year demonstrate minimal to no arthritis between the vertebral bodies or in the facet joints.  Follow-Up Instructions: No follow-ups on file.   Orders:  No orders of the defined types were placed in this encounter.  No orders of the defined types were placed in this encounter.     Procedures: No procedures performed   Clinical Data: No additional findings.  Objective: Vital Signs: There were no vitals taken for this visit.  Physical Exam:  Constitutional: Patient appears well-developed HEENT:  Head: Normocephalic Eyes:EOM are normal Neck: Normal range of motion Cardiovascular: Normal rate  Pulmonary/chest: Effort normal Neurologic: Patient is alert Skin: Skin is warm Psychiatric: Patient has normal mood and affect  Ortho Exam: Ortho exam demonstrates range of motion on that right knee of 10 to 80 degrees.  Patella has about 1 cm medial and lateral mobility with no effusion.  No real warmth in the right knee versus left knee.  No lymphadenopathy.  Pedal pulses palpable.  Collaterals are stable to varus and valgus stress at 0 and 30 degrees.  Ankle dorsiflexion intact. No nerve root tension signs and no paresthesias L1-S1 bilaterally.  She does have  an area of numbness on the lateral aspect of her knee which is expected after knee replacement.  Ankle dorsiflexion plantarflexion quad hamstring strength 5+ out of 5 bilaterally with no groin pain on the right with internal or external rotation of the leg.  Specialty Comments:  No specialty comments available.  Imaging: No results found.   PMFS History: Patient Active Problem List   Diagnosis Date Noted   Primary hypertension 03/09/2022   Osteoarthritis of right knee 08/27/2021   S/P total knee arthroplasty, 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   COVID-19 virus infection 04/09/2021   Dyslipidemia 03/31/2021   Urinary frequency 10/29/2017   Diabetes mellitus without complication 09/14/2014   Special screening for malignant neoplasms, colon 11/25/2011   Diverticulosis of colon (without mention of hemorrhage) 11/25/2011   URTICARIA 01/31/2010   ECZEMA 05/03/2009   Past Medical History:  Diagnosis Date   Arthritis    Complication of anesthesia    Diabetes mellitus without complication (HCC)    PONV (postoperative nausea and vomiting)    Ruptured ovarian cyst     Family History  Problem Relation Age of Onset   Cancer Other        breast cancer   Colon cancer Neg Hx    Esophageal cancer Neg Hx    Stomach cancer Neg Hx    Rectal cancer Neg Hx     Past Surgical History:  Procedure Laterality Date   CESAREAN SECTION     COLONOSCOPY  06/05/2019   per Dr. Orvan Falconer, single adenomatous polyp, repeat in 7 yrs   KNEE ARTHROPLASTY Right 08/27/2021   Procedure: COMPUTER ASSISTED TOTAL KNEE ARTHROPLASTY;  Surgeon: Samson Frederic, MD;  Location: [facility];  Service: Orthopedics;  Laterality: Right;   KNEE CLOSED REDUCTION Right 11/20/2021   Procedure: CLOSED MANIPULATION KNEE;  Surgeon: Samson Frederic, MD;  Location: [facility];  Service: Orthopedics;  Laterality: Right;   lumpectomy,benign, right breast     x2   RIGHT SHOULDER SURGERY      Social History   Occupational History   Occupation: food  service  Tobacco Use   Smoking status: Never   Smokeless tobacco: Never  Vaping Use   Vaping Use: Never used  Substance and Sexual Activity   Alcohol use: Yes    Alcohol/week: 0.0 standard drinks of alcohol    Comment: maybe twice a month   Drug use: No   Sexual activity: Not on file

## 2022-12-25 ENCOUNTER — Other Ambulatory Visit: Payer: Self-pay

## 2022-12-25 DIAGNOSIS — M79604 Pain in right leg: Secondary | ICD-10-CM

## 2022-12-25 NOTE — Progress Notes (Signed)
MRI ordered Please complete surgery sheet for Debbie.

## 2022-12-28 ENCOUNTER — Other Ambulatory Visit: Payer: Self-pay

## 2022-12-28 DIAGNOSIS — M25561 Pain in right knee: Secondary | ICD-10-CM

## 2022-12-28 NOTE — Progress Notes (Signed)
Hi can you set up metal allergy testing sooner rather than later before her knee replacement revision.  Thanks I talked to her son and he remembers that she she does wear pretty much any type of joint she wants but when her knee does get warmer does tend to itch.

## 2022-12-30 ENCOUNTER — Encounter: Payer: Self-pay | Admitting: Internal Medicine

## 2022-12-30 ENCOUNTER — Ambulatory Visit (INDEPENDENT_AMBULATORY_CARE_PROVIDER_SITE_OTHER): Payer: 59 | Admitting: Internal Medicine

## 2022-12-30 VITALS — BP 124/68 | HR 85 | Temp 98.3°F | Ht 61.0 in | Wt 141.0 lb

## 2022-12-30 DIAGNOSIS — Z96651 Presence of right artificial knee joint: Secondary | ICD-10-CM | POA: Diagnosis not present

## 2022-12-30 DIAGNOSIS — L230 Allergic contact dermatitis due to metals: Secondary | ICD-10-CM | POA: Diagnosis not present

## 2022-12-30 NOTE — Progress Notes (Signed)
NEW PATIENT Date of Service/Encounter:  12/30/22 Referring provider: Nelwyn Salisbury, MD Primary care provider: Nelwyn Salisbury, MD  Subjective:  [name] [name] is a 67 y.o. female with a PMHx of osteoarthritis, DM, dyslipidemia, HTN presenting today for evaluation of possible metal contact dermatitis. History obtained from: chart review and patient.   She had surgery on her right knee in December 2022. Since her surgery, her knee has not been well. She has pain, can not bend it, and about 3-5 months ago, she now has itching overlying internal knee. Sometimes earrings make her itchy, but no rash.  She denies other known metal allergies. They are considering redoing her surgery. Referral notes 12/28/22: urgent referral for metal testing prior to knee surgery She has no topical or systemic steroids recently.   Other allergy screening: Asthma: no Rhino conjunctivitis: no Food allergy: no Medication allergy: no Hymenoptera allergy: no Eczema:no  Past Medical History: Past Medical History:  Diagnosis Date   Arthritis    Complication of anesthesia    Diabetes mellitus without complication    PONV (postoperative nausea and vomiting)    Ruptured ovarian cyst    Medication List:  Current Outpatient Medications  Medication Sig Dispense Refill   aspirin EC 81 MG tablet Take 81 mg by mouth daily. Swallow whole.     azelastine (ASTELIN) 0.1 % nasal spray Place into both nostrils. (Patient not taking: Reported on 10/05/2022)     ibuprofen (ADVIL) 200 MG tablet Take 400 mg by mouth every 6 (six) hours as needed for moderate pain or headache. (Patient not taking: Reported on 10/05/2022)     losartan (COZAAR) 50 MG tablet Take 1 tablet (50 mg total) by mouth daily. 90 tablet 3   meloxicam (MOBIC) 7.5 MG tablet Take 1 tablet (7.5 mg total) by mouth daily. 30 tablet 5   metFORMIN (GLUCOPHAGE) 1000 MG tablet Take 1 tablet (1,000 mg total) by mouth 2 (two) times daily with a meal. 180 tablet 3    Omega-3 Fatty Acids (FISH OIL PO) Take 1 capsule by mouth daily.     sitaGLIPtin (JANUVIA) 50 MG tablet Take 1 tablet (50 mg total) by mouth daily. 30 tablet 2   No current facility-administered medications for this visit.   Known Allergies:  Allergies  Allergen Reactions   Hydrocodone Bit-Homatrop Mbr Nausea And Vomiting   Past Surgical History: Past Surgical History:  Procedure Laterality Date   CESAREAN SECTION     COLONOSCOPY  06/05/2019   per Dr. Orvan Falconer, single adenomatous polyp, repeat in 7 yrs   KNEE ARTHROPLASTY Right 08/27/2021   Procedure: COMPUTER ASSISTED TOTAL KNEE ARTHROPLASTY;  Surgeon: Samson Frederic, MD;  Location: [facility];  Service: Orthopedics;  Laterality: Right;   KNEE CLOSED REDUCTION Right 11/20/2021   Procedure: CLOSED MANIPULATION KNEE;  Surgeon: Samson Frederic, MD;  Location: [facility];  Service: Orthopedics;  Laterality: Right;   lumpectomy,benign, right breast     x2   RIGHT SHOULDER SURGERY      Family History: Family History  Problem Relation Age of Onset   Cancer Other        breast cancer   Colon cancer Neg Hx    Esophageal cancer Neg Hx    Stomach cancer Neg Hx    Rectal cancer Neg Hx    Social History: [name] [name] lives in a house, no water damage, wood floors, central AC, gas heating, no pets, no roaches, not using DM protections on bedding, works at [employer] in [place] [place],  no HEPA filter in home, [place] interstate[place] .   ROS:  All other systems negative except as noted per HPI.  Objective:  Blood pressure 124/68, pulse 85, temperature 98.3 F (36.8 C), height  (1.549 m), weight 141 lb (64 kg), SpO2 97 %. Body mass index is 26.64 kg/m. Physical Exam:  General Appearance:  Alert, cooperative, no distress, appears stated age  Head:  Normocephalic, without obvious abnormality, atraumatic  Eyes:  Conjunctiva clear, EOM's intact  Nose: Nares normal, hypertrophic turbinates, normal mucosa, and no visible anterior  polyps  Throat: Lips, tongue normal; teeth and gums normal, normal posterior oropharynx  Neck: Supple, symmetrical  Lungs:   clear to auscultation bilaterally, Respirations unlabored, no coughing  Heart:  regular rate and rhythm and no murmur, Appears well perfused  Extremities: Right knee with overlying well-healed scar, no erythema, slight edema, slight warmth compared to left, limited extension  Skin: Skin color, texture, turgor normal and no rashes or lesions on visualized portions of skin  Neurologic: No gross deficits   Diagnostics: None today  Assessment and Plan  Concern for metal contact dermatitis:  - return for patch testing - this will be Monday 29th at 9 AM (patch placement/no bathing, no excessive sweating, no getting wet) - then return Wednesday May 1st at 11:30 AM (patches removed, okay to bath but try not to put products on back/no scrubbing) - Friday May 3rd at 11 AM - Monday May 6th at 9 AM (final read) We will provide these results to your orthopedic surgeon.  Work note for absenteeism Monday 29th through May 1st for patch placement and removal process.   This note in its entirety was forwarded to the Provider who requested this consultation.  Thank you for your kind referral. I appreciate the opportunity to take part in [name] [name]'s care. Please do not hesitate to contact me with questions.  Sincerely,  Tonny Bollman, MD [facility]

## 2022-12-30 NOTE — Progress Notes (Signed)
[name] [name], Shakaria Fenton Malling, Darral Dash) (Patient) Open Communication  Communication   Contacting Type:PatientContacting:Chien, EstherRelationship to Patient:SelfMethod:Patient Portal MessageType:OutgoingOutcome:   Comments   Mychart message sent to patient

## 2022-12-30 NOTE — Patient Instructions (Addendum)
Concern for metal contact dermatitis:  - return for patch testing - this will be Monday 29th at 9 AM (patch placement/no bathing, no excessive sweating, no getting wet) - then return Wednesday May 1st at 11:30 AM (patches removed, okay to bath but try not to put products on back/no scrubbing) - Friday May 3rd at 11 AM - Monday May 6th at 9 AM (final read) We will provide these results to your orthopedic surgeon.  Work note for absenteeism Monday 29th through May 1st for patch placement and removal process.   It was a pleasure meeting you in clinic today! Thank you for allowing me to participate in your care.  Tonny Bollman, MD [facility]

## 2023-01-04 ENCOUNTER — Ambulatory Visit: Payer: 59 | Admitting: Family Medicine

## 2023-01-04 ENCOUNTER — Other Ambulatory Visit: Payer: Self-pay

## 2023-01-04 ENCOUNTER — Encounter: Payer: Self-pay | Admitting: Family Medicine

## 2023-01-04 VITALS — BP 110/68 | HR 82 | Temp 98.1°F | Ht 61.0 in | Wt 140.8 lb

## 2023-01-04 DIAGNOSIS — L230 Allergic contact dermatitis due to metals: Secondary | ICD-10-CM

## 2023-01-04 NOTE — Patient Instructions (Addendum)
Diagnostics:   Metals Patch     Time Antigen Placed  01/04/2023    Location  Back    Number of Test  11    Reading Interval  Day    Chromium chloride 1%  0     Cobalt chloride hexahydrate 1%  0     Molybdenum chloride 0.5%  0     Nickel sulfate hexahydrate 5%  0     Potassium dichromate 0.25%  0     Copper sulfate pentahydrate 2%  0     Titanium 0.1%  0     Manganese chloride 0.5%  0     Tantal 1%  0    Vanadium pentoxide 10%  0   Aluminum hydroxide 10%  0    Comments  N/A   Discussed with patient that patch testing tests for contact dermatitis sometimes does not correlate to how one will react to metals in the body. Positive patch testing results can help in avoiding those items however it is possible to get false negative results.  Nevertheless, this is the most accessible test for metal sensitivity currently available.  Metal Patches placed today. Please avoid strenuous physical activities and do not get the patches on the back wet.  Okay to take antihistamines for itching but avoid placing any creams on the back where the patches are. We will remove the patches on Wednesday and will do our initial read. Then you will return on Friday and Monday for readings Plan:   Allergic contact dermatitis - Instructions provided on care of the patches for the next 48 hours. Summit Borchardt was instructed to avoid showering for the next 48 hours. Kathalina Ostermann will follow up in 48 hours and 96 hours for patch readings.    Call the clinic if this treatment plan is not working well for you  Follow up in 2 days or sooner if needed.

## 2023-01-04 NOTE — Progress Notes (Signed)
Follow-up Note  RE: [name] [name] MRN: [id number] DOB: [birth year] Date of Office Visit: 01/04/2023  Primary care provider: Nelwyn Salisbury, MD Referring provider: Nelwyn Salisbury, MD   [name] [name] returns to the office today for the patch test placement, given suspected history of contact dermatitis. She reports that she continues to experience pruritus on the skin on her left knee as well as pain and stiffness after surgery in 2022. She was referred by Dr. August Saucer at [facility] for evaluation of possible metal allergy. She reports that she is feeling well overall and has not had any steroids over the last 4 months. Her current medications are listed in the chart.    Diagnostics:   Metals Patch     Time Antigen Placed  01/04/2023    Location  Back    Number of Test  11    Reading Interval  Placement    Chromium chloride 1%  0     Cobalt chloride hexahydrate 1%  0     Molybdenum chloride 0.5%  0     Nickel sulfate hexahydrate 5%  0     Potassium dichromate 0.25%  0     Copper sulfate pentahydrate 2%  0     Titanium 0.1%  0     Manganese chloride 0.5%  0     Tantal 1%  0    Vanadium pentoxide 10%  0   Aluminum hydroxide 10%  0    Comments  N/A   Discussed with patient that patch testing tests for contact dermatitis sometimes does not correlate to how one will react to metals in the body. Positive patch testing results can help in avoiding those items however it is possible to get false negative results.  Nevertheless, this is the most accessible test for metal sensitivity currently available.  Metal Patches placed today. Please avoid strenuous physical activities and do not get the patches on the back wet.  Okay to take antihistamines for itching but avoid placing any creams on the back where the patches are. We will remove the patches on Wednesday and will do our initial read. Then you will return on Friday and Monday for readings Plan:   Allergic contact dermatitis - Instructions provided  on care of the patches for the next 48 hours. [name] [name] was instructed to avoid showering for the next 48 hours. [name] [name] will follow up in 48 hours and 96 hours for patch readings.     Call the clinic if this treatment plan is not working well for you  Follow up in 2 days or sooner if needed.  Thank you for the opportunity to care for this patient.  Please do not hesitate to contact me with questions.  Thermon Leyland, FNP [facility]

## 2023-01-05 NOTE — Progress Notes (Unsigned)
   Follow Up Note  RE: [name] [name] MRN: [id number] DOB: August 30, 1956 Date of Office Visit: 01/06/2023  Referring provider: Nelwyn Salisbury, MD Primary care provider: Nelwyn Salisbury, MD  History of Present Illness: I had the pleasure of seeing [name] [name] for a follow up visit at the [facility] on 01/06/2023. She is a 67 y.o. female, who is being followed for suspected metal contact dermatitis. Today she is here for initial patch test interpretation, given suspected history of contact dermatitis.   Diagnostics:   METAL PATCH TEST 48 hour reading: positive to nickel ++ and copper +, cobalt +/-     Assessment and Plan: [name] [name] is a 67 y.o. female with: Concern for Metal Contact Dermatitis:  Patient instructed on skin care Patient to return Friday and Monday for subsequent reads  Return in about 2 days (around 01/08/2023).  It was my pleasure to see [name] [name] today and participate in her care. Please feel free to contact me with any questions or concerns.  Sincerely,   Tonny Bollman, MD [facility]

## 2023-01-06 ENCOUNTER — Encounter: Payer: Self-pay | Admitting: Family Medicine

## 2023-01-06 ENCOUNTER — Ambulatory Visit: Payer: 59 | Admitting: Internal Medicine

## 2023-01-06 ENCOUNTER — Ambulatory Visit: Payer: 59 | Admitting: Family Medicine

## 2023-01-06 VITALS — BP 120/64 | HR 83 | Temp 98.0°F | Wt 140.4 lb

## 2023-01-06 DIAGNOSIS — L230 Allergic contact dermatitis due to metals: Secondary | ICD-10-CM

## 2023-01-06 DIAGNOSIS — K5732 Diverticulitis of large intestine without perforation or abscess without bleeding: Secondary | ICD-10-CM

## 2023-01-06 MED ORDER — CIPROFLOXACIN HCL 500 MG PO TABS: 500.0000 mg | ORAL_TABLET | Freq: Two times a day (BID) | ORAL | 0 refills | Status: AC

## 2023-01-06 MED ORDER — METRONIDAZOLE 500 MG PO TABS: 500.0000 mg | ORAL_TABLET | Freq: Three times a day (TID) | ORAL | 0 refills | Status: DC

## 2023-01-06 NOTE — Addendum Note (Signed)
Addended by: Areta Haber B on: 01/06/2023 12:06 PM   Modules accepted: Orders

## 2023-01-06 NOTE — Progress Notes (Signed)
   Subjective:    Patient ID: [name] [name], female    DOB: 02-18-1956, 67 y.o.   MRN: [id number]  HPI Here for 4 weeks of intermittent lower abdominal pains, nausea without vomiting, and loose stools. She usually passes 4 to 6 loose stools every day, and she often has an urgent BM shortly after eating. No recent medication changes or travel. No fever. No urinary symptoms. Of note, her colonoscopy in 2020 showed sigmoid diverticulosis. She has never been diagnosed with diverticulitis however.    Review of Systems  Constitutional: Negative.   Respiratory: Negative.    Cardiovascular: Negative.   Gastrointestinal:  Positive for abdominal pain, diarrhea and nausea. Negative for abdominal distention, blood in stool, constipation, rectal pain and vomiting.  Genitourinary: Negative.        Objective:   Physical Exam Constitutional:      Appearance: Normal appearance. She is not ill-appearing.  Cardiovascular:     Rate and Rhythm: Normal rate and regular rhythm.     Pulses: Normal pulses.     Heart sounds: Normal heart sounds.  Pulmonary:     Effort: Pulmonary effort is normal.     Breath sounds: Normal breath sounds.  Abdominal:     General: Abdomen is flat. Bowel sounds are normal. There is no distension.     Palpations: Abdomen is soft. There is no mass.     Tenderness: There is no right CVA tenderness, left CVA tenderness, guarding or rebound.     Hernia: No hernia is present.     Comments: Mildly tender in the LLQ   Neurological:     Mental Status: She is alert.           Assessment & Plan:  Diverticulitis, treat with 10 days of Cipro and Flagyl. Recheck as needed.  Gershon Crane, MD

## 2023-01-06 NOTE — Addendum Note (Signed)
Addended by: Areta Haber B on: 01/06/2023 12:15 PM   Modules accepted: Orders

## 2023-01-07 ENCOUNTER — Other Ambulatory Visit: Payer: Self-pay

## 2023-01-08 ENCOUNTER — Ambulatory Visit: Payer: 59 | Admitting: Internal Medicine

## 2023-01-08 ENCOUNTER — Encounter: Payer: 59 | Admitting: Internal Medicine

## 2023-01-08 DIAGNOSIS — L230 Allergic contact dermatitis due to metals: Secondary | ICD-10-CM

## 2023-01-08 NOTE — Patient Instructions (Signed)
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 Positive patch testing results can help in avoiding those items however it is possible to get false negative results.  Nevertheless, this is the most accessible test for metal sensitivity currently available.  Please avoid strenuous physical activities and do not get the patches on the back wet.  Okay to take antihistamines for itching but avoid placing any creams on the back where the patches are.   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  [name] [name] will follow up on Monday for delayed final metal patch test read   Call the clinic if this treatment plan is not working well for you  Follow up on Monday

## 2023-01-08 NOTE — Progress Notes (Signed)
   Follow Up Note  RE: [name] [name] MRN: [id number] DOB: 12/04/1955 Date of Office Visit: 01/08/2023  Referring provider: Nelwyn Salisbury, MD Primary care provider: Nelwyn Salisbury, MD  History of Present Illness: I had the pleasure of seeing [name] [name] for a follow up visit at the [facility] on 01/08/2023. She is a 67 y.o. female, who is being followed for contact dermatitis. Today she is here for  second metal patch test read , given suspected history of contact dermatitis.   [name] [name] returns to the office today for second metal patch test interpretation, given suspected history of contact dermatitis.    Diagnostics:   Metal patch test read 96 hours. Cobalt, Copper, Nickel     Metals Patch     Time Antigen Placed      Location     Number of Test     Reading Interval 01/08/2023    Chromium chloride 1%  0     Cobalt chloride hexahydrate 1%  1+     Molybdenum chloride 0.5%  0     Nickel sulfate hexahydrate 5%  2+    Potassium dichromate 0.25%  0     Copper sulfate pentahydrate 2%  1+    Titanium 0.1%  0     Manganese chloride 0.5%  0     Tantal 1%  0    Vanadium pentoxide 10%  0   Aluminum hydroxide 10%  0    Nickel sulfate hexahydrate 5%  0     Comments  n/a     Assessment and Plan: [name] [name] is a 67 y.o. female with: Concern for Contact Dermatitis:  The patient has been provided detailed information regarding the substances she is sensitive to, as well as products containing the substances.  Meticulous avoidance of these substances is recommended. If avoidance is not possible, the use of barrier creams or lotions is recommended. If symptoms persist or progress despite meticulous avoidance of chemicals/substances above, dermatology evaluation may be warranted.  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  Positive patch testing results can help in avoiding those items however it is possible to get false negative results.  Nevertheless, this is the most accessible test for metal sensitivity currently available.  Please avoid strenuous physical activities and do not get the patches on the back wet.  Okay to take antihistamines for itching but avoid placing any creams on the back where the patches are.   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  [name] [name] will follow up on Monday for delayed final metal patch test read  No follow-ups on file.  It was my pleasure to see [name] [name] today and participate in her care. Please feel free to contact me with any questions or concerns.  Sincerely,   Alesia Morin, MD [facility]

## 2023-01-09 ENCOUNTER — Ambulatory Visit
Admission: RE | Admit: 2023-01-09 | Discharge: 2023-01-09 | Disposition: A | Discharge status: Home / Self Care | Payer: 59 | Source: Ambulatory Visit | Attending: Orthopedic Surgery | Admitting: Orthopedic Surgery

## 2023-01-09 DIAGNOSIS — M79604 Pain in right leg: Secondary | ICD-10-CM

## 2023-01-11 ENCOUNTER — Telehealth: Payer: Self-pay | Admitting: Family Medicine

## 2023-01-11 ENCOUNTER — Encounter: Payer: Self-pay | Admitting: Family Medicine

## 2023-01-11 ENCOUNTER — Other Ambulatory Visit: Payer: 59

## 2023-01-11 ENCOUNTER — Ambulatory Visit: Payer: 59 | Admitting: Family Medicine

## 2023-01-11 DIAGNOSIS — L230 Allergic contact dermatitis due to metals: Secondary | ICD-10-CM

## 2023-01-11 NOTE — Progress Notes (Signed)
/     Follow-up Note  RE: [name] [name] MRN: [id number] DOB: [birth year] Date of Office Visit: 01/11/2023  Primary care provider: Nelwyn Salisbury, MD Referring provider: Nelwyn Salisbury, MD   Taree returns to the office today for the final patch test interpretation, given suspected history of contact dermatitis.    Diagnostics:   Metals Patch     Time Antigen Placed  01/04/2023    Location  Back    Number of Test  11    Reading Interval  Day 7    Chromium chloride 1%  0     Cobalt chloride hexahydrate 1%  0     Molybdenum chloride 0.5%  0     Nickel sulfate hexahydrate 5%  ++    Potassium dichromate 0.25%  0     Copper sulfate pentahydrate 2%  +     Titanium 0.1%  0     Manganese chloride 0.5%  0     Tantal 1%  0    Vanadium pentoxide 10%  0   Aluminum hydroxide 10%  0    Comments  N/A  Readings from 01/06/2023 and 01/08/2023 were also positive to cobalt chloride hexahydrate 1%   Plan:   Allergic contact dermatitis - The patient has been provided detailed information regarding the substances she is sensitive to, as well as products containing the substances.   - Meticulous avoidance of these substances is recommended.  - If avoidance is not possible, the use of barrier creams or lotions is recommended. - If symptoms persist or progress despite meticulous avoidance of cobalt chloride hexahydrate 1%, nickel sulfate hexahydrate 5%, and copper sulfate pentahydrate 2%, a dermatology referral may be warranted.  Thank you for the opportunity to care for this patient.  Please do not hesitate to contact me with questions.  Thermon Leyland, FNP [facility]

## 2023-01-11 NOTE — Telephone Encounter (Signed)
Forwarding message to provider.

## 2023-01-11 NOTE — Patient Instructions (Addendum)
Diagnostics:   Metals Patch     Time Antigen Placed  01/04/2023    Location  Back    Number of Test  11    Reading Interval  Day 7    Chromium chloride 1%  0     Cobalt chloride hexahydrate 1%  0     Molybdenum chloride 0.5%  0     Nickel sulfate hexahydrate 5%  ++    Potassium dichromate 0.25%  0     Copper sulfate pentahydrate 2%  +     Titanium 0.1%  0     Manganese chloride 0.5%  0     Tantal 1%  0    Vanadium pentoxide 10%  0   Aluminum hydroxide 10%  0    Comments  N/A  Readings from 01/06/2023 and 5/32024 were also positive to cobalt chloride hexahydrate 1%   Plan:   Allergic contact dermatitis - The patient has been provided detailed information regarding the substances she is sensitive to, as well as products containing the substances.   - Meticulous avoidance of these substances is recommended.  - If avoidance is not possible, the use of barrier creams or lotions is recommended. - If symptoms persist or progress despite meticulous avoidance of cobalt chloride hexahydrate 1%, nickel sulfate hexahydrate 5%, and copper sulfate pentahydrate 2% , a dermatology referral may be warranted.  Call the clinic if this treatment plan is not working well for you  Follow up in 3 months or sooner if needed.

## 2023-01-11 NOTE — Telephone Encounter (Signed)
Patient states someone called her after her appointment this morning, she was returning that call.

## 2023-01-15 ENCOUNTER — Telehealth: Payer: Self-pay | Admitting: Orthopedic Surgery

## 2023-01-15 ENCOUNTER — Telehealth: Payer: Self-pay | Admitting: Family Medicine

## 2023-01-15 NOTE — Telephone Encounter (Signed)
Noted  

## 2023-01-15 NOTE — Telephone Encounter (Signed)
FYI Misty with [facility] is calling to let md know she is the case manager for this patient and if pt need resources md can contact her

## 2023-01-15 NOTE — Telephone Encounter (Signed)
Please advise 

## 2023-01-15 NOTE — Telephone Encounter (Signed)
Patient's son  Kelby Fam called asked if patient need to come back in to see Dr. 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  The number to contact Kelby Fam is [phone]

## 2023-01-19 ENCOUNTER — Telehealth: Payer: Self-pay | Admitting: Orthopedic Surgery

## 2023-01-19 NOTE — Telephone Encounter (Signed)
Patient's son  Kelby Fam called asked if patient need to come back in to see Dr. 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  The number to contact Kelby Fam is [phone]

## 2023-01-22 ENCOUNTER — Encounter: Payer: Self-pay | Admitting: Family Medicine

## 2023-01-22 ENCOUNTER — Ambulatory Visit: Payer: 59 | Admitting: Family Medicine

## 2023-01-22 VITALS — BP 130/80 | HR 74 | Temp 98.4°F | Wt 140.2 lb

## 2023-01-22 DIAGNOSIS — K529 Noninfective gastroenteritis and colitis, unspecified: Secondary | ICD-10-CM

## 2023-01-22 DIAGNOSIS — E119 Type 2 diabetes mellitus without complications: Secondary | ICD-10-CM | POA: Diagnosis not present

## 2023-01-22 DIAGNOSIS — K5732 Diverticulitis of large intestine without perforation or abscess without bleeding: Secondary | ICD-10-CM

## 2023-01-22 DIAGNOSIS — Z7984 Long term (current) use of oral hypoglycemic drugs: Secondary | ICD-10-CM | POA: Diagnosis not present

## 2023-01-22 MED ORDER — METFORMIN HCL 1000 MG PO TABS: 500.0000 mg | ORAL_TABLET | Freq: Two times a day (BID) | ORAL | 3 refills | Status: DC

## 2023-01-22 NOTE — Telephone Encounter (Signed)
I called.  She actually does have itching around the knee when he gets warm.  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  Have talked with Josh in both the implant systems we have both primary and revision are for metal allergy patients.  We have ruled out infection also with lab work and aspiration.  I think based on all available clinical information that revision is going to give her the best chance that improving her knee.  All this is discussed with her son manual.

## 2023-01-22 NOTE — Progress Notes (Signed)
   Subjective:    Patient ID: [name] [name], female    DOB: Feb 16, 1956, 67 y.o.   MRN: [id number]  HPI Here to follow up on lower abdominal pains and frequent stools. We saw her on 01-06-23 when she was quite tender in there lower abdomen on exam, and we felt she had diverticulitis. She was  treated with 10 days of Flagyl and Cipro, and after that she felt better. The pains resolved but she still had frequent formed stools. She then realized that her abdominal issues began around the time we increased her Metformin from 500 mg BID to 1000 mg BID. Last week she decreased the Metformin 1000 mg to once a day, and her bowel symptoms improved a bit. No fever or nausea.    Review of Systems  Constitutional: Negative.   Respiratory: Negative.    Cardiovascular: Negative.   Gastrointestinal:  Negative for abdominal distention, abdominal pain, blood in stool, constipation, diarrhea, nausea, rectal pain and vomiting.  Genitourinary: Negative.        Objective:   Physical Exam Constitutional:      Appearance: Normal appearance.  Cardiovascular:     Rate and Rhythm: Normal rate and regular rhythm.     Pulses: Normal pulses.     Heart sounds: Normal heart sounds.  Pulmonary:     Effort: Pulmonary effort is normal.     Breath sounds: Normal breath sounds.  Abdominal:     General: Abdomen is flat. Bowel sounds are normal. There is no distension.     Palpations: Abdomen is soft. There is no mass.     Tenderness: There is no abdominal tenderness. There is no right CVA tenderness, left CVA tenderness, guarding or rebound.     Hernia: No hernia is present.  Neurological:     Mental Status: She is alert.           Assessment & Plan:  It seems she has had 2 different issues going on. One was a diverticulitis which has now resolved after a course of Cipro and Flagyl. The other is due to apparent side effects of the Metformin. I asked her to stop the Metformin completely until the day after her  shoulder surgery (on 02-02-23) to allow her bowels to settle down. After that she will resume the Metformin by taking 500 mg (or half a pill) twice daily. Recheck in 3-4 weeks.  Gershon Crane, MD

## 2023-01-22 NOTE — Progress Notes (Signed)
Surgical Instructions    Your procedure is scheduled on Tuesday, 02/02/23.  Report to [facility] Main Entrance "A" at 9:45 A.M., then check in with the Admitting office.  Call this number if you have problems the morning of surgery:  [phone]   If you have any questions prior to your surgery date call [phone]: Open Monday-Friday 8am-4pm If you experience any cold or flu symptoms such as cough, fever, chills, shortness of breath, etc. between now and your scheduled surgery, please notify us at the above number     Remember:  Do not eat after midnight the night before your surgery  You may drink clear liquids until 8:45am the morning of your surgery.   Clear liquids allowed are: Water, Non-Citrus Juices (without pulp), Carbonated Beverages, Clear Tea, Black Coffee ONLY (NO MILK, CREAM OR POWDERED CREAMER of any kind), and Gatorade  Patient Instructions  The night before surgery:  No food after midnight. ONLY clear liquids after midnight  The day of surgery (if you have diabetes): Drink ONE (1) 12 oz G2 given to you in your pre admission testing appointment by 8:45am the morning of surgery. Drink in one sitting. Do not sip.  This drink was given to you during your hospital  pre-op appointment visit.  Nothing else to drink after completing the  12 oz bottle of G2.         If you have questions, please contact your surgeon's office.     Take these medicines the morning of surgery with A SIP OF WATER:  azelastine (ASTELIN) nasal spray   As of today, STOP taking any Aspirin (unless otherwise instructed by your surgeon) Aleve, Naproxen, Ibuprofen, Motrin, Advil, Goody's, BC's, all herbal medications, fish oil, and all vitamins. This includes meloxicam (MOBIC).   WHAT DO I DO ABOUT MY DIABETES MEDICATION?   Do not take oral diabetes medicines (pills) the morning of surgery.      THE MORNING OF SURGERY, do not take metFORMIN (GLUCOPHAGE) or sitaGLIPtin (JANUVIA).  The day  of surgery, do not take other diabetes injectables, including Byetta (exenatide), Bydureon (exenatide ER), Victoza (liraglutide), or Trulicity (dulaglutide).  If your CBG is greater than 220 mg/dL, you may take  of your sliding scale (correction) dose of insulin.   HOW TO MANAGE YOUR DIABETES BEFORE AND AFTER SURGERY  Why is it important to control my blood sugar before and after surgery? Improving blood sugar levels before and after surgery helps healing and can limit problems. A way of improving blood sugar control is eating a healthy diet by:  Eating less sugar and carbohydrates  Increasing activity/exercise  Talking with your doctor about reaching your blood sugar goals High blood sugars (greater than 180 mg/dL) can raise your risk of infections and slow your recovery, so you will need to focus on controlling your diabetes during the weeks before surgery. Make sure that the doctor who takes care of your diabetes knows about your planned surgery including the date and location.  How do I manage my blood sugar before surgery? Check your blood sugar at least 4 times a day, starting 2 days before surgery, to make sure that the level is not too high or low.  Check your blood sugar the morning of your surgery when you wake up and every 2 hours until you get to the Short Stay unit.  If your blood sugar is less than 70 mg/dL, you will need to treat for low blood sugar: Do not take insulin. Treat  a low blood sugar (less than 70 mg/dL) with  cup of clear juice (cranberry or apple), 4 glucose tablets, OR glucose gel. Recheck blood sugar in 15 minutes after treatment (to make sure it is greater than 70 mg/dL). If your blood sugar is not greater than 70 mg/dL on recheck, call [phone] for further instructions. Report your blood sugar to the short stay nurse when you get to Short Stay.  If you are admitted to the hospital after surgery: Your blood sugar will be checked by the staff and you  will probably be given insulin after surgery (instead of oral diabetes medicines) to make sure you have good blood sugar levels. The goal for blood sugar control after surgery is 80-180 mg/dL.            Do not wear jewelry or makeup. Do not wear lotions, powders, perfumes or deodorant. Do not shave 48 hours prior to surgery.   Do not bring valuables to the hospital. Do not wear nail polish, gel polish, artificial nails, or any other type of covering on natural nails (fingers and toes) If you have artificial nails or gel coating that need to be removed by a nail salon, please have this removed prior to surgery. Artificial nails or gel coating may interfere with anesthesia's ability to adequately monitor your vital signs.  [facility] is not responsible for any belongings or valuables.    Do NOT Smoke (Tobacco/Vaping)  24 hours prior to your procedure  If you use a CPAP at night, you may bring your mask for your overnight stay.   Contacts, glasses, hearing aids, dentures or partials may not be worn into surgery, please bring cases for these belongings   For patients admitted to the hospital, discharge time will be determined by your treatment team.   Patients discharged the day of surgery will not be allowed to drive home, and someone needs to stay with them for 24 hours.   SURGICAL WAITING ROOM VISITATION Patients having surgery or a procedure may have no more than 2 support people in the waiting area - these visitors may rotate.   Children under the age of 14 must have an adult with them who is not the patient. If the patient needs to stay at the hospital during part of their recovery, the visitor guidelines for inpatient rooms apply. Pre-op nurse will coordinate an appropriate time for 1 support person to accompany patient in pre-op.  This support person may not rotate.   Please refer to [email or web address] for the visitor guidelines  for Inpatients (after your surgery is over and you are in a regular room).    Special instructions:    Oral Hygiene is also important to reduce your risk of infection.  Remember - BRUSH YOUR TEETH THE MORNING OF SURGERY WITH YOUR REGULAR TOOTHPASTE   [place]- Preparing For Surgery  Before surgery, you can play an important role. Because skin is not sterile, your skin needs to be as free of germs as possible. You can reduce the number of germs on your skin by washing with CHG (chlorahexidine gluconate) Soap before surgery.  CHG is an antiseptic cleaner which kills germs and bonds with the skin to continue killing germs even after washing.     Please do not use if you have an allergy to CHG or antibacterial soaps. If your skin becomes reddened/irritated stop using the CHG.  Do not shave (including legs and underarms) for at least 48 hours prior to first CHG  shower. It is OK to shave your face.  Please follow these instructions carefully.     Shower the NIGHT BEFORE SURGERY and the MORNING OF SURGERY with CHG Soap.   If you chose to wash your hair, wash your hair first as usual with your normal shampoo. After you shampoo, rinse your hair and body thoroughly to remove the shampoo.  Then [employer] and genitals (private parts) with your normal soap and rinse thoroughly to remove soap.  After that Use CHG Soap as you would any other liquid soap. You can apply CHG directly to the skin and wash gently with a scrungie or a clean washcloth.   Apply the CHG Soap to your body ONLY FROM THE NECK DOWN.  Do not use on open wounds or open sores. Avoid contact with your eyes, ears, mouth and genitals (private parts). Wash Face and genitals (private parts)  with your normal soap.   Wash thoroughly, paying special attention to the area where your surgery will be performed.  Thoroughly rinse your body with warm water from the neck down.  DO NOT shower/wash with your normal soap after using and rinsing  off the CHG Soap.  Pat yourself dry with a CLEAN TOWEL.  Wear CLEAN PAJAMAS to bed the night before surgery  Place CLEAN SHEETS on your bed the night before your surgery  DO NOT SLEEP WITH PETS.   Day of Surgery: Take a shower with CHG soap. Wear Clean/Comfortable clothing the morning of surgery Do not apply any deodorants/lotions.   Remember to brush your teeth WITH YOUR REGULAR TOOTHPASTE.    If you received a COVID test during your pre-op visit, it is requested that you wear a mask when out in public, stay away from anyone that may not be feeling well, and notify your surgeon if you develop symptoms. If you have been in contact with anyone that has tested positive in the last 10 days, please notify your surgeon.    Please read over the following fact sheets that you were given.

## 2023-01-22 NOTE — Progress Notes (Signed)
Surgical Instructions   Your procedure is scheduled on Tuesday, 02/02/23.  Report to [facility] Main Entrance "A" at 9:45 AM, then check in with the Admitting office.  Call this number if you have problems the morning of surgery:  [phone]   If you have any questions prior to your surgery date call [phone]: Open Monday-Friday 8am-4pm If you experience any cold or flu symptoms such as cough, fever, chills, shortness of breath, etc. between now and your scheduled surgery, please notify us at the above number    Remember:  Do not eat after midnight the night before your surgery  You may drink clear liquids until 8:45am the morning of your surgery.   Clear liquids allowed are: Water, Non-Citrus Juices (without pulp), Carbonated Beverages, Clear Tea, Black Coffee ONLY (NO MILK, CREAM OR POWDERED CREAMER of any kind), and Gatorade  Patient Instructions  The night before surgery:  No food after midnight. ONLY clear liquids after midnight  The day of surgery (if you have diabetes): Drink ONE (1) 12 oz G2 given to you in your pre admission testing appointment by 8:45am the morning of surgery. Drink in one sitting. Do not sip.  This drink was given to you during your hospital  pre-op appointment visit.  Nothing else to drink after completing the  12 oz bottle of G2.         If you have questions, please contact your surgeon's office.     Take these medicines the morning of surgery with A SIP OF WATER: NONE  As of today, STOP taking any Aspirin (unless otherwise instructed by your surgeon) Aleve, Naproxen, Ibuprofen, Motrin, Advil, Goody's, BC's, all herbal medications, fish oil, and all vitamins. This includes meloxicam (MOBIC).   WHAT DO I DO ABOUT MY DIABETES MEDICATION?  Do not take oral diabetes medicines (pills) the morning of surgery.    THE MORNING OF SURGERY, do not take metFORMIN (GLUCOPHAGE).  HOW TO MANAGE YOUR DIABETES BEFORE AND AFTER SURGERY  Why is it important  to control my blood sugar before and after surgery? Improving blood sugar levels before and after surgery helps healing and can limit problems. A way of improving blood sugar control is eating a healthy diet by:  Eating less sugar and carbohydrates  Increasing activity/exercise  Talking with your doctor about reaching your blood sugar goals High blood sugars (greater than 180 mg/dL) can raise your risk of infections and slow your recovery, so you will need to focus on controlling your diabetes during the weeks before surgery. Make sure that the doctor who takes care of your diabetes knows about your planned surgery including the date and location.  How do I manage my blood sugar before surgery? Check your blood sugar at least 4 times a day, starting 2 days before surgery, to make sure that the level is not too high or low.  Check your blood sugar the morning of your surgery when you wake up and every 2 hours until you get to the Short Stay unit.  If your blood sugar is less than 70 mg/dL, you will need to treat for low blood sugar: Do not take insulin. Treat a low blood sugar (less than 70 mg/dL) with  cup of clear juice (cranberry or apple), 4 glucose tablets, OR glucose gel. Recheck blood sugar in 15 minutes after treatment (to make sure it is greater than 70 mg/dL). If your blood sugar is not greater than 70 mg/dL on recheck, call [phone] for further instructions.  Report your blood sugar to the short stay nurse when you get to Short Stay.  If you are admitted to the hospital after surgery: Your blood sugar will be checked by the staff and you will probably be given insulin after surgery (instead of oral diabetes medicines) to make sure you have good blood sugar levels. The goal for blood sugar control after surgery is 80-180 mg/dL.          Do not wear jewelry or makeup. Do not wear lotions, powders, perfumes or deodorant. Do not shave 48 hours prior to surgery.   Do not bring  valuables to the hospital. Do not wear nail polish, gel polish, artificial nails, or any other type of covering on natural nails (fingers and toes) If you have artificial nails or gel coating that need to be removed by a nail salon, please have this removed prior to surgery. Artificial nails or gel coating may interfere with anesthesia's ability to adequately monitor your vital signs.  [facility] is not responsible for any belongings or valuables.      Contacts, glasses, hearing aids, dentures or partials may not be worn into surgery, please bring cases for these belongings   For patients admitted to the hospital, discharge time will be determined by your treatment team.   Patients discharged the day of surgery will not be allowed to drive home, and someone needs to stay with them for 24 hours.   SURGICAL WAITING ROOM VISITATION Patients having surgery or a procedure may have no more than 2 support people in the waiting area - these visitors may rotate.   Children under the age of 11 must have an adult with them who is not the patient. If the patient needs to stay at the hospital during part of their recovery, the visitor guidelines for inpatient rooms apply. Pre-op nurse will coordinate an appropriate time for 1 support person to accompany patient in pre-op.  This support person may not rotate.   Please refer to [email or web address] for the visitor guidelines for Inpatients (after your surgery is over and you are in a regular room).    Special instructions:    Oral Hygiene is also important to reduce your risk of infection.  Remember - BRUSH YOUR TEETH THE MORNING OF SURGERY WITH YOUR REGULAR TOOTHPASTE   [facility]- Preparing For Surgery    Pre-operative 5 CHG Bath Instructions   You can play a key role in reducing the risk of infection after surgery. Your skin needs to be as free of germs as possible. You can reduce the number of  germs on your skin by washing with CHG (chlorhexidine gluconate) soap before surgery. CHG is an antiseptic soap that kills germs and continues to kill germs even after washing.   DO NOT use if you have an allergy to chlorhexidine/CHG or antibacterial soaps. If your skin becomes reddened or irritated, stop using the CHG and notify one of our RNs at [phone].   Please shower with the CHG soap starting 4 days before surgery using the following schedule:     Please keep in mind the following:  DO NOT shave, including legs and underarms, starting the day of your first shower.   You may shave your face at any point before/day of surgery.  Place clean sheets on your bed the day you start using CHG soap. Use a clean washcloth (not used since being washed) for each shower. DO NOT sleep with pets once you start using the CHG.  CHG Shower Instructions:  If you choose to wash your hair and private area, wash first with your normal shampoo/soap.  After you use shampoo/soap, rinse your hair and body thoroughly to remove shampoo/soap residue.  Turn the water OFF and apply about 3 tablespoons (45 ml) of CHG soap to a CLEAN washcloth.  Apply CHG soap ONLY FROM YOUR NECK DOWN TO YOUR TOES (washing for 3-5 minutes)  DO NOT use CHG soap on face, private areas, open wounds, or sores.  Pay special attention to the area where your surgery is being performed.  If you are having back surgery, having someone wash your back for you may be helpful. Wait 2 minutes after CHG soap is applied, then you may rinse off the CHG soap.  Pat dry with a clean towel  Put on clean clothes/pajamas   If you choose to wear lotion, please use ONLY the CHG-compatible lotions on the back of this paper.     Additional instructions for the day of surgery: DO NOT APPLY any lotions, deodorants, cologne, or perfumes.   Put on clean/comfortable clothes.  Brush your teeth.  Ask your nurse before applying any prescription medications  to the skin.    CHG Compatible Lotions   Aveeno Moisturizing lotion  Cetaphil Moisturizing Cream  Cetaphil Moisturizing Lotion  Clairol Herbal Essence Moisturizing Lotion, Dry Skin  Clairol Herbal Essence Moisturizing Lotion, Extra Dry Skin  Clairol Herbal Essence Moisturizing Lotion, Normal Skin  Curel Age Defying Therapeutic Moisturizing Lotion with Alpha Hydroxy  Curel Extreme Care Body Lotion  Curel Soothing Hands Moisturizing Hand Lotion  Curel Therapeutic Moisturizing Cream, Fragrance-Free  Curel Therapeutic Moisturizing Lotion, Fragrance-Free  Curel Therapeutic Moisturizing Lotion, Original Formula  Eucerin Daily Replenishing Lotion  Eucerin Dry Skin Therapy Plus Alpha Hydroxy Crme  Eucerin Dry Skin Therapy Plus Alpha Hydroxy Lotion  Eucerin Original Crme  Eucerin Original Lotion  Eucerin Plus Crme Eucerin Plus Lotion  Eucerin TriLipid Replenishing Lotion  Keri Anti-Bacterial Hand Lotion  Keri Deep Conditioning Original Lotion Dry Skin Formula Softly Scented  Keri Deep Conditioning Original Lotion, Fragrance Free Sensitive Skin Formula  Keri Lotion Fast Absorbing Fragrance Free Sensitive Skin Formula  Keri Lotion Fast Absorbing Softly Scented Dry Skin Formula  Keri Original Lotion  Keri Skin Renewal Lotion Keri Silky Smooth Lotion  Keri Silky Smooth Sensitive Skin Lotion  Nivea Body Creamy Conditioning Oil  Nivea Body Extra Enriched Lotion  Nivea Body Original Lotion  Nivea Body Sheer Moisturizing Lotion Nivea Crme  Nivea Skin Firming Lotion  NutraDerm 30 Skin Lotion  NutraDerm Skin Lotion  NutraDerm Therapeutic Skin Cream  NutraDerm Therapeutic Skin Lotion  ProShield Protective Hand Cream  Provon moisturizing lotion    Please read over the fact sheets that you were given.

## 2023-01-25 ENCOUNTER — Encounter (HOSPITAL_COMMUNITY)
Admission: RE | Admit: 2023-01-25 | Discharge: 2023-01-25 | Disposition: A | Discharge status: Home / Self Care | Payer: 59 | Source: Ambulatory Visit | Attending: Orthopedic Surgery | Admitting: Orthopedic Surgery

## 2023-01-25 ENCOUNTER — Other Ambulatory Visit: Payer: Self-pay

## 2023-01-25 ENCOUNTER — Encounter (HOSPITAL_COMMUNITY): Payer: Self-pay

## 2023-01-25 VITALS — BP 127/61 | HR 68 | Temp 98.1°F | Ht 61.0 in | Wt 140.6 lb

## 2023-01-25 DIAGNOSIS — Z01818 Encounter for other preprocedural examination: Secondary | ICD-10-CM | POA: Diagnosis present

## 2023-01-25 HISTORY — DX: Essential (primary) hypertension: I10

## 2023-01-25 HISTORY — DX: Calculus of gallbladder without cholecystitis without obstruction: K80.20

## 2023-01-25 LAB — CBC
HCT: 38 % (ref 36.0–46.0)
Hemoglobin: 11.8 g/dL — ABNORMAL LOW (ref 12.0–15.0)
MCH: 26.4 pg (ref 26.0–34.0)
MCHC: 31.1 g/dL (ref 30.0–36.0)
MCV: 85 fL (ref 80.0–100.0)
Platelets: 367 10*3/uL (ref 150–400)
RBC: 4.47 MIL/uL (ref 3.87–5.11)
RDW: 14.3 % (ref 11.5–15.5)
WBC: 7.5 10*3/uL (ref 4.0–10.5)
nRBC: 0 % (ref 0.0–0.2)

## 2023-01-25 LAB — URINALYSIS, COMPLETE (UACMP) WITH MICROSCOPIC
Bacteria, UA: NONE SEEN
Bilirubin Urine: NEGATIVE
Glucose, UA: NEGATIVE mg/dL
Hgb urine dipstick: NEGATIVE
Ketones, ur: NEGATIVE mg/dL
Nitrite: NEGATIVE
Protein, ur: NEGATIVE mg/dL
Specific Gravity, Urine: 1.017 (ref 1.005–1.030)
pH: 5 (ref 5.0–8.0)

## 2023-01-25 LAB — BASIC METABOLIC PANEL
Anion gap: 9 (ref 5–15)
BUN: 13 mg/dL (ref 8–23)
CO2: 27 mmol/L (ref 22–32)
Calcium: 8.8 mg/dL — ABNORMAL LOW (ref 8.9–10.3)
Chloride: 101 mmol/L (ref 98–111)
Creatinine, Ser: 0.51 mg/dL (ref 0.44–1.00)
GFR, Estimated: >60 mL/min (ref >60)
Glucose, Bld: 128 mg/dL — ABNORMAL HIGH (ref 70–99)
Potassium: 3.7 mmol/L (ref 3.5–5.1)
Sodium: 137 mmol/L (ref 135–145)

## 2023-01-25 LAB — TYPE AND SCREEN
ABO/RH(D): O POS
Antibody Screen: NEGATIVE

## 2023-01-25 LAB — HEMOGLOBIN A1C
Hgb A1c MFr Bld: 6.7 % — ABNORMAL HIGH (ref 4.8–5.6)
Mean Plasma Glucose: 145.59 mg/dL

## 2023-01-25 LAB — SURGICAL PCR SCREEN
MRSA, PCR: NEGATIVE
Staphylococcus aureus: NEGATIVE

## 2023-01-25 LAB — GLUCOSE, CAPILLARY: Glucose-Capillary: 128 mg/dL — ABNORMAL HIGH (ref 70–99)

## 2023-01-25 NOTE — Progress Notes (Signed)
PCP - Dr. Gershon Crane  EKG - 01/25/23 Stress Test - denies ECHO - denies Cardiac Cath - denies  Fasting Blood Sugar - around 125-130 Checks Blood Sugar _1____ times a day  Aspirin Instructions: instructed her to follow Dr. Diamantina Providence instructions and to call office if no instructions  ERAS Protcol - yes With G2  COVID TEST- N/A   Anesthesia review: No  Patient denies shortness of breath, fever, cough and chest pain at PAT appointment   All instructions explained to the patient, with a verbal understanding of the material. Patient agrees to go over the instructions while at home for a better understanding. Patient also instructed to self quarantine after being tested for COVID-19. The opportunity to ask questions was provided.

## 2023-01-26 LAB — URINE CULTURE: Culture: <10000 — AB

## 2023-01-27 ENCOUNTER — Ambulatory Visit: Payer: Self-pay | Admitting: Internal Medicine

## 2023-01-29 ENCOUNTER — Ambulatory Visit: Payer: Self-pay | Admitting: Internal Medicine

## 2023-02-02 ENCOUNTER — Encounter (HOSPITAL_COMMUNITY)
Admission: RE | Disposition: A | Discharge status: Home / Self Care | Payer: Self-pay | Source: Home / Self Care | Attending: Orthopedic Surgery

## 2023-02-02 ENCOUNTER — Ambulatory Visit (HOSPITAL_COMMUNITY): Payer: 59 | Admitting: Certified Registered"

## 2023-02-02 ENCOUNTER — Encounter (HOSPITAL_COMMUNITY): Payer: Self-pay | Admitting: Orthopedic Surgery

## 2023-02-02 ENCOUNTER — Observation Stay (HOSPITAL_COMMUNITY)
Admission: RE | Admit: 2023-02-02 | Discharge: 2023-02-03 | Disposition: A | Discharge status: Home / Self Care | Payer: 59 | Attending: Orthopedic Surgery | Admitting: Orthopedic Surgery

## 2023-02-02 ENCOUNTER — Telehealth: Payer: Self-pay | Admitting: Orthopedic Surgery

## 2023-02-02 ENCOUNTER — Other Ambulatory Visit: Payer: Self-pay

## 2023-02-02 ENCOUNTER — Inpatient Hospital Stay (HOSPITAL_COMMUNITY): Payer: 59

## 2023-02-02 DIAGNOSIS — T8484XA Pain due to internal orthopedic prosthetic devices, implants and grafts, initial encounter: Secondary | ICD-10-CM

## 2023-02-02 DIAGNOSIS — Z7984 Long term (current) use of oral hypoglycemic drugs: Secondary | ICD-10-CM | POA: Diagnosis not present

## 2023-02-02 DIAGNOSIS — Z96651 Presence of right artificial knee joint: Secondary | ICD-10-CM | POA: Diagnosis not present

## 2023-02-02 DIAGNOSIS — E119 Type 2 diabetes mellitus without complications: Secondary | ICD-10-CM

## 2023-02-02 DIAGNOSIS — Y828 Other medical devices associated with adverse incidents: Secondary | ICD-10-CM | POA: Diagnosis not present

## 2023-02-02 DIAGNOSIS — I10 Essential (primary) hypertension: Secondary | ICD-10-CM | POA: Diagnosis not present

## 2023-02-02 DIAGNOSIS — T8453XA Infection and inflammatory reaction due to internal right knee prosthesis, initial encounter: Secondary | ICD-10-CM | POA: Diagnosis not present

## 2023-02-02 DIAGNOSIS — T84092A Other mechanical complication of internal right knee prosthesis, initial encounter: Secondary | ICD-10-CM

## 2023-02-02 DIAGNOSIS — Z8616 Personal history of COVID-19: Secondary | ICD-10-CM | POA: Insufficient documentation

## 2023-02-02 DIAGNOSIS — Z01818 Encounter for other preprocedural examination: Principal | ICD-10-CM

## 2023-02-02 HISTORY — PX: TOTAL KNEE REVISION: SHX996

## 2023-02-02 LAB — GLUCOSE, CAPILLARY
Glucose-Capillary: 120 mg/dL — ABNORMAL HIGH (ref 70–99)
Glucose-Capillary: 110 mg/dL — ABNORMAL HIGH (ref 70–99)
Glucose-Capillary: 197 mg/dL — ABNORMAL HIGH (ref 70–99)
Glucose-Capillary: 199 mg/dL — ABNORMAL HIGH (ref 70–99)

## 2023-02-02 LAB — AEROBIC/ANAEROBIC CULTURE W GRAM STAIN (SURGICAL/DEEP WOUND)

## 2023-02-02 SURGERY — TOTAL KNEE REVISION
Anesthesia: Spinal | Site: Knee | Laterality: Right

## 2023-02-02 MED ORDER — INSULIN ASPART 100 UNIT/ML IJ SOLN
4.0000 [IU] | Freq: Three times a day (TID) | INTRAMUSCULAR | Status: DC
  Administered 2023-02-03 (×2): 4 [IU] via SUBCUTANEOUS

## 2023-02-02 MED ORDER — MORPHINE SULFATE (PF) 4 MG/ML IV SOLN
INTRAVENOUS | Status: AC
  Filled 2023-02-02: qty 2

## 2023-02-02 MED ORDER — GLYCOPYRROLATE 0.2 MG/ML IJ SOLN
INTRAMUSCULAR | Status: DC | PRN
  Administered 2023-02-02: .1 mg via INTRAVENOUS

## 2023-02-02 MED ORDER — SODIUM CHLORIDE 0.9 % IR SOLN
Status: DC | PRN
  Administered 2023-02-02: 3000 mL

## 2023-02-02 MED ORDER — LIDOCAINE 2% (20 MG/ML) 5 ML SYRINGE
INTRAMUSCULAR | Status: DC | PRN
  Administered 2023-02-02: 40 mg via INTRAVENOUS

## 2023-02-02 MED ORDER — BUPIVACAINE HCL (PF) 0.5 % IJ SOLN: INTRAMUSCULAR | Status: DC | PRN

## 2023-02-02 MED ORDER — BUPIVACAINE HCL (PF) 0.25 % IJ SOLN
INTRAMUSCULAR | Status: DC | PRN
  Administered 2023-02-02: 30 mL

## 2023-02-02 MED ORDER — CELECOXIB 100 MG PO CAPS
100.0000 mg | ORAL_CAPSULE | Freq: Two times a day (BID) | ORAL | Status: DC
  Administered 2023-02-02 – 2023-02-03 (×2): 100 mg via ORAL
  Filled 2023-02-02 (×3): qty 1

## 2023-02-02 MED ORDER — LACTATED RINGERS IV SOLN: INTRAVENOUS | Status: DC | PRN

## 2023-02-02 MED ORDER — LOSARTAN POTASSIUM 50 MG PO TABS
50.0000 mg | ORAL_TABLET | Freq: Every day | ORAL | Status: DC
  Filled 2023-02-02: qty 1

## 2023-02-02 MED ORDER — TRANEXAMIC ACID-NACL 1000-0.7 MG/100ML-% IV SOLN
INTRAVENOUS | Status: AC
  Filled 2023-02-02: qty 100

## 2023-02-02 MED ORDER — KETOROLAC TROMETHAMINE 15 MG/ML IJ SOLN: 15.0000 mg | Freq: Once | INTRAMUSCULAR | Status: DC

## 2023-02-02 MED ORDER — PHENYLEPHRINE 80 MCG/ML (10ML) SYRINGE FOR IV PUSH (FOR BLOOD PRESSURE SUPPORT)
PREFILLED_SYRINGE | INTRAVENOUS | Status: DC | PRN
  Administered 2023-02-02 (×2): 80 ug via INTRAVENOUS
  Administered 2023-02-02: 160 ug via INTRAVENOUS

## 2023-02-02 MED ORDER — ONDANSETRON HCL 4 MG/2ML IJ SOLN
INTRAMUSCULAR | Status: AC
  Filled 2023-02-02: qty 2

## 2023-02-02 MED ORDER — BUPIVACAINE HCL (PF) 0.5 % IJ SOLN
INTRAMUSCULAR | Status: DC | PRN
  Administered 2023-02-02 (×5): 2 mL via PERINEURAL

## 2023-02-02 MED ORDER — CEFAZOLIN SODIUM-DEXTROSE 2-4 GM/100ML-% IV SOLN
INTRAVENOUS | Status: AC
  Filled 2023-02-02: qty 100

## 2023-02-02 MED ORDER — DOCUSATE SODIUM 100 MG PO CAPS
100.0000 mg | ORAL_CAPSULE | Freq: Two times a day (BID) | ORAL | Status: DC
  Administered 2023-02-03: 100 mg via ORAL
  Filled 2023-02-02: qty 1

## 2023-02-02 MED ORDER — CEFAZOLIN SODIUM-DEXTROSE 1-4 GM/50ML-% IV SOLN
1.0000 g | Freq: Three times a day (TID) | INTRAVENOUS | Status: AC
  Administered 2023-02-02 – 2023-02-03 (×2): 1 g via INTRAVENOUS
  Filled 2023-02-02 (×2): qty 50

## 2023-02-02 MED ORDER — METOCLOPRAMIDE HCL 5 MG/ML IJ SOLN: 5.0000 mg | Freq: Three times a day (TID) | INTRAMUSCULAR | Status: DC | PRN

## 2023-02-02 MED ORDER — BUPIVACAINE LIPOSOME 1.3 % IJ SUSP
INTRAMUSCULAR | Status: DC | PRN
  Administered 2023-02-02 (×5): 2 mL via PERINEURAL

## 2023-02-02 MED ORDER — MIDAZOLAM HCL 2 MG/2ML IJ SOLN: 2.0000 mg | Freq: Once | INTRAMUSCULAR | Status: AC

## 2023-02-02 MED ORDER — CLONIDINE HCL (ANALGESIA) 100 MCG/ML EP SOLN
EPIDURAL | Status: AC
  Filled 2023-02-02: qty 10

## 2023-02-02 MED ORDER — HYDROMORPHONE HCL 1 MG/ML IJ SOLN: 0.2500 mg | INTRAMUSCULAR | Status: DC | PRN

## 2023-02-02 MED ORDER — PROPOFOL 10 MG/ML IV BOLUS
INTRAVENOUS | Status: DC | PRN
  Administered 2023-02-02: 60 mg via INTRAVENOUS
  Administered 2023-02-02: 200 mg via INTRAVENOUS

## 2023-02-02 MED ORDER — ONDANSETRON HCL 4 MG/2ML IJ SOLN
INTRAMUSCULAR | Status: DC | PRN
  Administered 2023-02-02: 4 mg via INTRAVENOUS

## 2023-02-02 MED ORDER — BUPIVACAINE LIPOSOME 1.3 % IJ SUSP
INTRAMUSCULAR | Status: DC | PRN
  Administered 2023-02-02: 20 mL

## 2023-02-02 MED ORDER — ACETAMINOPHEN 325 MG PO TABS: 325.0000 mg | ORAL_TABLET | Freq: Four times a day (QID) | ORAL | Status: DC | PRN

## 2023-02-02 MED ORDER — VANCOMYCIN HCL 1000 MG IV SOLR
INTRAVENOUS | Status: DC | PRN
  Administered 2023-02-02: 1000 mg via TOPICAL

## 2023-02-02 MED ORDER — ROCURONIUM BROMIDE 10 MG/ML (PF) SYRINGE
PREFILLED_SYRINGE | INTRAVENOUS | Status: AC
  Filled 2023-02-02: qty 10

## 2023-02-02 MED ORDER — CEFAZOLIN SODIUM-DEXTROSE 2-4 GM/100ML-% IV SOLN
2.0000 g | INTRAVENOUS | Status: AC
  Administered 2023-02-02: 2 g via INTRAVENOUS

## 2023-02-02 MED ORDER — METFORMIN HCL 500 MG PO TABS
500.0000 mg | ORAL_TABLET | Freq: Two times a day (BID) | ORAL | Status: DC
  Administered 2023-02-03: 500 mg via ORAL
  Filled 2023-02-02: qty 1

## 2023-02-02 MED ORDER — SUGAMMADEX SODIUM 200 MG/2ML IV SOLN
INTRAVENOUS | Status: DC | PRN
  Administered 2023-02-02: 120 mg via INTRAVENOUS

## 2023-02-02 MED ORDER — FENTANYL CITRATE (PF) 100 MCG/2ML IJ SOLN
INTRAMUSCULAR | Status: AC
  Administered 2023-02-02: 100 ug via INTRAVENOUS
  Filled 2023-02-02: qty 2

## 2023-02-02 MED ORDER — POVIDONE-IODINE 7.5 % EX SOLN
Freq: Once | CUTANEOUS | Status: DC
  Filled 2023-02-02: qty 118

## 2023-02-02 MED ORDER — POVIDONE-IODINE 10 % EX SWAB: 2.0000 | Freq: Once | CUTANEOUS | Status: AC

## 2023-02-02 MED ORDER — METHOCARBAMOL 500 MG PO TABS
500.0000 mg | ORAL_TABLET | Freq: Four times a day (QID) | ORAL | Status: DC | PRN
  Administered 2023-02-03: 500 mg via ORAL
  Filled 2023-02-02 (×2): qty 1

## 2023-02-02 MED ORDER — FENTANYL CITRATE (PF) 100 MCG/2ML IJ SOLN: 100.0000 ug | Freq: Once | INTRAMUSCULAR | Status: AC

## 2023-02-02 MED ORDER — ONDANSETRON HCL 4 MG PO TABS: 4.0000 mg | ORAL_TABLET | Freq: Four times a day (QID) | ORAL | Status: DC | PRN

## 2023-02-02 MED ORDER — ASPIRIN 81 MG PO CHEW
81.0000 mg | CHEWABLE_TABLET | Freq: Two times a day (BID) | ORAL | Status: DC
  Administered 2023-02-02 – 2023-02-03 (×2): 81 mg via ORAL
  Filled 2023-02-02 (×2): qty 1

## 2023-02-02 MED ORDER — BUPIVACAINE-EPINEPHRINE (PF) 0.5% -1:200000 IJ SOLN
INTRAMUSCULAR | Status: AC
  Filled 2023-02-02: qty 30

## 2023-02-02 MED ORDER — ONDANSETRON HCL 4 MG/2ML IJ SOLN: 4.0000 mg | Freq: Four times a day (QID) | INTRAMUSCULAR | Status: DC | PRN

## 2023-02-02 MED ORDER — MIDAZOLAM HCL 2 MG/2ML IJ SOLN
INTRAMUSCULAR | Status: AC
  Administered 2023-02-02: 2 mg via INTRAVENOUS
  Filled 2023-02-02: qty 2

## 2023-02-02 MED ORDER — CHLORHEXIDINE GLUCONATE 0.12 % MT SOLN
OROMUCOSAL | Status: AC
  Administered 2023-02-02: 15 mL via OROMUCOSAL
  Filled 2023-02-02: qty 15

## 2023-02-02 MED ORDER — MENTHOL 3 MG MT LOZG: 1.0000 | LOZENGE | OROMUCOSAL | Status: DC | PRN

## 2023-02-02 MED ORDER — PROMETHAZINE HCL 25 MG/ML IJ SOLN
INTRAMUSCULAR | Status: AC
  Filled 2023-02-02: qty 1

## 2023-02-02 MED ORDER — TRANEXAMIC ACID 1000 MG/10ML IV SOLN
2000.0000 mg | INTRAVENOUS | Status: DC
  Filled 2023-02-02 (×2): qty 20

## 2023-02-02 MED ORDER — CLONIDINE HCL (ANALGESIA) 100 MCG/ML EP SOLN
EPIDURAL | Status: DC | PRN
  Administered 2023-02-02: 1 mL

## 2023-02-02 MED ORDER — BUPIVACAINE HCL (PF) 0.25 % IJ SOLN
INTRAMUSCULAR | Status: AC
  Filled 2023-02-02: qty 30

## 2023-02-02 MED ORDER — INSULIN ASPART 100 UNIT/ML IJ SOLN
0.0000 [IU] | Freq: Three times a day (TID) | INTRAMUSCULAR | Status: DC
  Administered 2023-02-03: 3 [IU] via SUBCUTANEOUS
  Administered 2023-02-03: 2 [IU] via SUBCUTANEOUS

## 2023-02-02 MED ORDER — VANCOMYCIN HCL 1000 MG IV SOLR
INTRAVENOUS | Status: AC
  Filled 2023-02-02: qty 20

## 2023-02-02 MED ORDER — HYDROMORPHONE HCL 1 MG/ML IJ SOLN
INTRAMUSCULAR | Status: AC
  Filled 2023-02-02: qty 0.5

## 2023-02-02 MED ORDER — ROCURONIUM BROMIDE 100 MG/10ML IV SOLN
INTRAVENOUS | Status: DC | PRN
  Administered 2023-02-02: 30 mg via INTRAVENOUS
  Administered 2023-02-02: 20 mg via INTRAVENOUS
  Administered 2023-02-02: 10 mg via INTRAVENOUS
  Administered 2023-02-02 (×2): 30 mg via INTRAVENOUS

## 2023-02-02 MED ORDER — HYDROMORPHONE HCL 1 MG/ML IJ SOLN: 0.5000 mg | INTRAMUSCULAR | Status: DC | PRN

## 2023-02-02 MED ORDER — SUCCINYLCHOLINE CHLORIDE 200 MG/10ML IV SOSY
PREFILLED_SYRINGE | INTRAVENOUS | Status: DC | PRN
  Administered 2023-02-02: 160 mg via INTRAVENOUS

## 2023-02-02 MED ORDER — PROMETHAZINE HCL 25 MG/ML IJ SOLN
6.2500 mg | INTRAMUSCULAR | Status: DC | PRN
  Administered 2023-02-02: 6.25 mg via INTRAVENOUS

## 2023-02-02 MED ORDER — TRANEXAMIC ACID-NACL 1000-0.7 MG/100ML-% IV SOLN
1000.0000 mg | INTRAVENOUS | Status: AC
  Administered 2023-02-02: 1000 mg via INTRAVENOUS

## 2023-02-02 MED ORDER — DEXAMETHASONE SODIUM PHOSPHATE 10 MG/ML IJ SOLN
INTRAMUSCULAR | Status: DC | PRN
  Administered 2023-02-02: 5 mg via INTRAVENOUS

## 2023-02-02 MED ORDER — SODIUM CHLORIDE 0.9 % IV SOLN: INTRAVENOUS | Status: AC

## 2023-02-02 MED ORDER — ORAL CARE MOUTH RINSE: 15.0000 mL | Freq: Once | OROMUCOSAL | Status: AC

## 2023-02-02 MED ORDER — ACETAMINOPHEN 500 MG PO TABS
1000.0000 mg | ORAL_TABLET | Freq: Four times a day (QID) | ORAL | Status: DC
  Administered 2023-02-03 (×2): 1000 mg via ORAL
  Filled 2023-02-02 (×2): qty 2

## 2023-02-02 MED ORDER — SUCCINYLCHOLINE CHLORIDE 200 MG/10ML IV SOSY
PREFILLED_SYRINGE | INTRAVENOUS | Status: AC
  Filled 2023-02-02: qty 10

## 2023-02-02 MED ORDER — INSULIN ASPART 100 UNIT/ML IJ SOLN: 0.0000 [IU] | INTRAMUSCULAR | Status: DC | PRN

## 2023-02-02 MED ORDER — POVIDONE-IODINE 10 % EX SWAB
2.0000 | Freq: Once | CUTANEOUS | Status: AC
  Administered 2023-02-02: 2 via TOPICAL

## 2023-02-02 MED ORDER — METHOCARBAMOL 1000 MG/10ML IJ SOLN: 500.0000 mg | Freq: Four times a day (QID) | INTRAVENOUS | Status: DC | PRN

## 2023-02-02 MED ORDER — MORPHINE SULFATE 4 MG/ML IJ SOLN
INTRAMUSCULAR | Status: DC | PRN
  Administered 2023-02-02: 8 mg

## 2023-02-02 MED ORDER — TRANEXAMIC ACID 1000 MG/10ML IV SOLN
INTRAVENOUS | Status: DC | PRN
  Administered 2023-02-02: 2000 mg via TOPICAL

## 2023-02-02 MED ORDER — PHENOL 1.4 % MT LIQD: 1.0000 | OROMUCOSAL | Status: DC | PRN

## 2023-02-02 MED ORDER — OXYCODONE HCL 5 MG PO TABS
5.0000 mg | ORAL_TABLET | ORAL | Status: DC | PRN
  Administered 2023-02-03: 10 mg via ORAL
  Filled 2023-02-02 (×2): qty 2

## 2023-02-02 MED ORDER — CHLORHEXIDINE GLUCONATE 0.12 % MT SOLN: 15.0000 mL | Freq: Once | OROMUCOSAL | Status: AC

## 2023-02-02 MED ORDER — METOCLOPRAMIDE HCL 5 MG PO TABS: 5.0000 mg | ORAL_TABLET | Freq: Three times a day (TID) | ORAL | Status: DC | PRN

## 2023-02-02 MED ORDER — DEXAMETHASONE SODIUM PHOSPHATE 10 MG/ML IJ SOLN
INTRAMUSCULAR | Status: AC
  Filled 2023-02-02: qty 1

## 2023-02-02 MED ORDER — BUPIVACAINE LIPOSOME 1.3 % IJ SUSP
INTRAMUSCULAR | Status: AC
  Filled 2023-02-02: qty 20

## 2023-02-02 MED ORDER — SODIUM CHLORIDE (PF) 0.9 % IJ SOLN
INTRAMUSCULAR | Status: DC | PRN
  Administered 2023-02-02: 20 mL via INTRAVENOUS

## 2023-02-02 MED ORDER — LIDOCAINE 2% (20 MG/ML) 5 ML SYRINGE
INTRAMUSCULAR | Status: AC
  Filled 2023-02-02: qty 5

## 2023-02-02 MED ORDER — HYDROMORPHONE HCL 1 MG/ML IJ SOLN
INTRAMUSCULAR | Status: DC | PRN
  Administered 2023-02-02: .5 mg via INTRAVENOUS

## 2023-02-02 MED ORDER — 0.9 % SODIUM CHLORIDE (POUR BTL) OPTIME
TOPICAL | Status: DC | PRN
  Administered 2023-02-02 (×9): 1000 mL

## 2023-02-02 MED ORDER — GLYCOPYRROLATE PF 0.2 MG/ML IJ SOSY
PREFILLED_SYRINGE | INTRAMUSCULAR | Status: AC
  Filled 2023-02-02: qty 1

## 2023-02-02 MED ORDER — LACTATED RINGERS IV SOLN: INTRAVENOUS | Status: DC

## 2023-02-02 SURGICAL SUPPLY — 87 items
AUG TIB XS CONE CNTR STRL LF (Insert) ×1 IMPLANT
BAG COUNTER SPONGE SURGICOUNT (BAG) ×1 IMPLANT
BAG SPNG CNTER NS LX DISP (BAG) ×1
BANDAGE ESMARK 6X9 LF (GAUZE/BANDAGES/DRESSINGS) ×1 IMPLANT
BLADE LONG MED 31X9 (MISCELLANEOUS) IMPLANT
BLADE SAG 18X100X1.27 (BLADE) ×1 IMPLANT
BLADE SAW SGTL 13.0X1.19X90.0M (BLADE) ×1 IMPLANT
BLADE SURG 10 STRL SS (BLADE) ×2 IMPLANT
BNDG CMPR 5X6 CHSV STRCH STRL (GAUZE/BANDAGES/DRESSINGS)
BNDG CMPR 9X6 STRL LF SNTH (GAUZE/BANDAGES/DRESSINGS) ×1
BNDG CMPR MED 10X6 ELC LF (GAUZE/BANDAGES/DRESSINGS)
BNDG CMPR MED 15X6 ELC VLCR LF (GAUZE/BANDAGES/DRESSINGS) ×1
BNDG COHESIVE 6X5 TAN ST LF (GAUZE/BANDAGES/DRESSINGS) ×1 IMPLANT
BNDG ELASTIC 4X5.8 VLCR STR LF (GAUZE/BANDAGES/DRESSINGS) ×1 IMPLANT
BNDG ELASTIC 6X10 VLCR STRL LF (GAUZE/BANDAGES/DRESSINGS) ×3 IMPLANT
BNDG ELASTIC 6X15 VLCR STRL LF (GAUZE/BANDAGES/DRESSINGS) IMPLANT
BNDG ESMARK 6X9 LF (GAUZE/BANDAGES/DRESSINGS) ×1
BOWL SMART MIX CTS (DISPOSABLE) ×1 IMPLANT
CEMENT BONE REFOBACIN R1X40 US (Cement) IMPLANT
COMP FEM CMT PS HIP NRW 6 RT (Joint) ×1 IMPLANT
COMPONENT FEM CMT HIP NRW 6RT (Joint) IMPLANT
COOLER ICEMAN CLASSIC (MISCELLANEOUS) IMPLANT
COVER SURGICAL LIGHT HANDLE (MISCELLANEOUS) ×1 IMPLANT
CUFF TOURN SGL QUICK 34 (TOURNIQUET CUFF) ×1
CUFF TOURN SGL QUICK 42 (TOURNIQUET CUFF) IMPLANT
CUFF TRNQT CYL 34X4.125X (TOURNIQUET CUFF) ×1 IMPLANT
DRAPE INCISE IOBAN 66X45 STRL (DRAPES) IMPLANT
DRAPE ORTHO SPLIT 77X108 STRL (DRAPES) ×2
DRAPE SURG ORHT 6 SPLT 77X108 (DRAPES) ×3 IMPLANT
DRAPE U-SHAPE 47X51 STRL (DRAPES) ×1 IMPLANT
DRSG AQUACEL AG ADV 3.5X10 (GAUZE/BANDAGES/DRESSINGS) ×2 IMPLANT
DRSG AQUACEL AG ADV 3.5X14 (GAUZE/BANDAGES/DRESSINGS) IMPLANT
DURAPREP 26ML APPLICATOR (WOUND CARE) ×1 IMPLANT
ELECT REM PT RETURN 9FT ADLT (ELECTROSURGICAL) ×1
ELECTRODE REM PT RTRN 9FT ADLT (ELECTROSURGICAL) ×1 IMPLANT
GAUZE SPONGE 4X4 12PLY STRL (GAUZE/BANDAGES/DRESSINGS) ×1 IMPLANT
GLOVE BIOGEL PI IND STRL 7.0 (GLOVE) IMPLANT
GLOVE BIOGEL PI IND STRL 8 (GLOVE) ×1 IMPLANT
GLOVE ECLIPSE 7.0 STRL STRAW (GLOVE) IMPLANT
GLOVE ECLIPSE 8.0 STRL XLNG CF (GLOVE) ×1 IMPLANT
GOWN STRL REUS W/ TWL LRG LVL3 (GOWN DISPOSABLE) ×3 IMPLANT
GOWN STRL REUS W/ TWL XL LVL3 (GOWN DISPOSABLE) ×1 IMPLANT
GOWN STRL REUS W/TWL LRG LVL3 (GOWN DISPOSABLE) ×3
GOWN STRL REUS W/TWL XL LVL3 (GOWN DISPOSABLE) ×1
HANDPIECE INTERPULSE COAX TIP (DISPOSABLE) ×1
HOOD PEEL AWAY T7 (MISCELLANEOUS) ×3 IMPLANT
IMMOBILIZER KNEE 22 UNIV (SOFTGOODS) ×1 IMPLANT
INSERT TIB FIX REV CMT SZC RT (Insert) IMPLANT
INSERT TIB PS CMTLS CC XSM (Insert) IMPLANT
INSTR SCRW HEX REV FIX 3.5X48 (ORTHOPEDIC DISPOSABLE SUPPLIES) ×1
INSTRUMENT SCRW HEX REV 3.5X48 (ORTHOPEDIC DISPOSABLE SUPPLIES) IMPLANT
K-WIRE DBL TROCAR .062X4 (WIRE) ×1
KIT BASIN OR (CUSTOM PROCEDURE TRAY) ×1 IMPLANT
KIT TURNOVER KIT B (KITS) ×1 IMPLANT
KWIRE DBL TROCAR .062X4 (WIRE) IMPLANT
LINER TIB ASF PS CD/6-9 12 RT (Liner) IMPLANT
MANIFOLD NEPTUNE II (INSTRUMENTS) ×1 IMPLANT
NDL 22X1.5 STRL (OR ONLY) (MISCELLANEOUS) ×2 IMPLANT
NEEDLE 22X1.5 STRL (OR ONLY) (MISCELLANEOUS) ×2 IMPLANT
NS IRRIG 1000ML POUR BTL (IV SOLUTION) ×1 IMPLANT
PACK TOTAL JOINT (CUSTOM PROCEDURE TRAY) ×1 IMPLANT
PAD ARMBOARD 7.5X6 YLW CONV (MISCELLANEOUS) ×2 IMPLANT
PAD CAST 4YDX4 CTTN HI CHSV (CAST SUPPLIES) ×1 IMPLANT
PAD COLD SHLDR WRAP-ON (PAD) IMPLANT
PADDING CAST COTTON 4X4 STRL (CAST SUPPLIES)
PADDING CAST COTTON 6X4 STRL (CAST SUPPLIES) ×3 IMPLANT
PIN DRILL HDLS TROCAR 75 4PK (PIN) IMPLANT
SCREW HEX HEADED 3.5X27 DISP (ORTHOPEDIC DISPOSABLE SUPPLIES) IMPLANT
SET HNDPC FAN SPRY TIP SCT (DISPOSABLE) ×1 IMPLANT
SPONGE T-LAP 18X18 ~~LOC~~+RFID (SPONGE) IMPLANT
STEM EXT OFFSET PERS 10X135 (Stem) IMPLANT
STRIP CLOSURE SKIN 1/2X4 (GAUZE/BANDAGES/DRESSINGS) IMPLANT
SUCTION FRAZIER HANDLE 10FR (MISCELLANEOUS) ×1
SUCTION TUBE FRAZIER 10FR DISP (MISCELLANEOUS) ×1 IMPLANT
SUT ETHILON 3 0 PS 1 (SUTURE) IMPLANT
SUT MNCRL AB 3-0 PS2 27 (SUTURE) ×2 IMPLANT
SUT VIC AB 0 CT1 27 (SUTURE) ×5
SUT VIC AB 0 CT1 27XBRD ANBCTR (SUTURE) ×3 IMPLANT
SUT VIC AB 1 CT1 27 (SUTURE) ×6
SUT VIC AB 1 CT1 27XBRD ANBCTR (SUTURE) ×3 IMPLANT
SUT VIC AB 2-0 CT1 (SUTURE) IMPLANT
SUT VIC AB 2-0 CT1 27 (SUTURE) ×2
SUT VIC AB 2-0 CT1 TAPERPNT 27 (SUTURE) ×2 IMPLANT
SYR 30ML LL (SYRINGE) ×1 IMPLANT
TOWEL GREEN STERILE (TOWEL DISPOSABLE) ×2 IMPLANT
TRAY FOLEY MTR SLVR 16FR STAT (SET/KITS/TRAYS/PACK) ×1 IMPLANT
WATER STERILE IRR 1000ML POUR (IV SOLUTION) ×3 IMPLANT

## 2023-02-02 NOTE — Transfer of Care (Signed)
Immediate Anesthesia Transfer of Care Note  Patient: [name] [name]  Procedure(s) Performed: REVISION RIGHT TOTAL KNEE ARTHROPLASTY (Right: Knee)  Patient Location: PACU  Anesthesia Type:General  Level of Consciousness: drowsy and patient cooperative  Airway & Oxygen Therapy: Patient Spontanous Breathing and Patient connected to nasal cannula oxygen  Post-op Assessment: Report given to RN  Post vital signs: Reviewed and stable  Last Vitals:  Vitals Value Taken Time  BP 129/58   Temp    Pulse 80   Resp 12   SpO2 99     Last Pain:  Vitals:   02/02/23 1140  PainSc: 0-No pain         Complications: No notable events documented.

## 2023-02-02 NOTE — Telephone Encounter (Signed)
[employer] forms received. To datavant.

## 2023-02-02 NOTE — Anesthesia Procedure Notes (Signed)
Anesthesia Regional Block: Adductor canal block   Pre-Anesthetic Checklist: , timeout performed,  Correct Patient, Correct Site, Correct Laterality,  Correct Procedure, Correct Position, site marked,  Risks and benefits discussed,  Surgical consent,  Pre-op evaluation,  At surgeon's request and post-op pain management  Laterality: Lower and Right  Prep: chloraprep       Needles:  Injection technique: Single-shot  Needle Type: Echogenic Stimulator Needle     Needle Length: 9cm  Needle Gauge: 20   Needle insertion depth: 3 cm   Additional Needles:   Procedures:,,,, ultrasound used (permanent image in chart),,    Narrative:  Start time: 02/02/2023 11:30 AM End time: 02/02/2023 11:37 AM Injection made incrementally with aspirations every 5 mL.  Performed by: Personally  Anesthesiologist: Leilani Able, MD

## 2023-02-02 NOTE — Op Note (Unsigned)
[name] [name], [name] [name] MEDICAL RECORD NO: [id number] ACCOUNT NO: [id number] DATE OF BIRTH: May 06, 1956 FACILITY: [facility] LOCATION: [facility] PHYSICIAN: Graylin Shiver. 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  PROCEDURE:  Revision right total knee replacement with removal of components and placement of Persona, femur cemented posterior stabilized narrow, size 6 femur with tibial revision stem with trabecular metal cone, size extra small, cemented size C keeled  tibia with 3 mm offset stem extension, 135 mm in length, noncemented 10 mm in diameter with 12 mm polyethylene fixed bearing constrained posterior stabilized insert.  SURGEON:  Graylin Shiver. August Saucer, MD  ASSISTANT:  Karenann Cai, PA.  INDICATIONS:  This is a 67 year old patient with right knee pain following knee replacement slightly under 2 years ago.  Extensive workup has led to possible metal allergy being the culprit for her continued pain and stiffness.  Infection workup and  extrinsic cause workup all negative.  DESCRIPTION OF PROCEDURE:  The patient was brought to the operating room where spinal anesthetic was attempted.  General endotracheal anesthesia was then performed.  The right leg was prescrubbed with alcohol and Betadine, allowed to air dry, prepped with DuraPrep solution and draped in sterile manner.  Preoperative range of motion was 10-85.  Ioban used to cover the operative field.  After calling timeout, leg was  elevated and exsanguinated with the Esmarch wrap.  Tourniquet was inflated.  Prior medial-sided incision was utilized and extended about 3 cm proximally and distally.  Skin and subcutaneous tissue were sharply divided.  Full thickness fasciocutaneous  flaps were mobilized particularly laterally for only a short distance to visualize the extensor  mechanism.  IrriSept solution utilized.  Median parapatellar arthrotomy was made and marked with #1 Vicryl suture.  No significant effusion was present in the  knee.  Cultures were obtained of little fluid was present.  Next, synovectomy was performed.  There was no real inflamed appearing synovium present.  Suprapatellar pouch was diminutive.  Medial and lateral gutters were cleared out along with the  suprapatellar pouch of synovium using sharp dissection.  Next, the fat pad behind the patellar tendon was also excised and tissue around the patella was excised.  The patella was very well seated.  Next medial soft tissue dissection was performed around  to the posteromedial corner of the tibia.  The spacer was then removed.  Then, we used ACL saw to break up the interface between the bone and the implant on the femoral side.  This was done meticulously to preserve bone.  The femur was then removed and  minimal bone loss occurred.  Next, we turned our attention towards the tibia.  In a similar manner, the bone implant interface was disrupted using the ACL saw.  Then, in a similar manner using stacked osteotomes that we did with the femur we used that  for the tibia.  This allowed removal of the component with minimal bone loss.  At this time, the cultures were obtained on the back surface of the implant.  No overt infection visible.  Next, the tibia was hand reamed up to 10 mm for a press-fit stem.   Cleanup cut was then made on the tibia, getting down to a good bony surface.  This involved about making a 2 mm cut off the least affected medial tibial plateau in terms of bone loss.  This gave a very flat cut over about 80% of the tibial plateau.   Next, the tibia was prepared for a cone.  Cone preparations were made and the cone was placed with good press fit obtained into the central portion of the tibia with good rotation.  Tibial trial was then placed, which seated nicely on the cut bony  surfaces.  Next,  attention was directed towards the femur.  Intramedullary alignment was then used and a cleanup cut of 5 mm was made off the distal femur.  Correct rotation was then made so that the flexion and extension gaps were equal and this was  then pinned and the 6 cutting block was pinned into position.  Anterior, posterior and chamfer cuts were then made.  In general, the posterior cuts required minimal bone cuts.  The remaining bone surfaces were robust and in good condition.  A trial  component was placed.  The box cut was then made on the femur and we trialled both a 10 and a 12 mm spacer.  The 12 mm spacer gave optimal stability to varus and valgus stress at 0, 30 and 90 degrees, trial components were removed.  Tourniquet was  released at this time.  Thorough irrigation was performed with 3 liters of pulsatile irrigation followed by pouring irrigation.  We then anesthetized the capsule using Marcaine, Exparel and saline.  We then placed tranexamic acid sponge into that area  for 3 minutes along with IrriSept solution.  IrriSept solution also utilized after the arthrotomy.  Next, we put the tourniquet up again after 30 minutes.  Cut bony surfaces were irrigated and dried.  Trial components were removed.  At this time,  IrriSept solution used in both canals.  The vancomycin powder placed in the tibial canal and the tibial cone was placed with very good press fit obtained.  Next, the component was cemented proximally in the metaphyseal region with good press fit obtained  distally.  Excess cement removed.  We also cemented on the femur and placed a 12 mm spacer.  Cement hardening was allowed to occur.  This gave a very nice stability of the implant.  Next, the true component was placed with same stability parameters  maintained including excellent patellar tracking, along with good stability to varus and valgus stress at 0, 30 and 90 degrees.  Tourniquet was again released after cement hardening had occurred.   Pouring irrigation again utilized.  The arthrotomy was  then closed over a bolster using #1 Vicryl suture.  Prior to final closure, we did irrigate with IrriSept solution and injected with Marcaine, morphine, clonidine.  Vancomycin powder also placed into the joint after the IrriSept.  Final closure was  performed using 0 Vicryl suture, 2-0 Vicryl suture, and 3-0 Monocryl with Steri-Strips and Aquacel dressing applied along with the knee immobilizer.  The patient tolerated the procedure well without immediate complications.  Luke's assistance was  required at all times for retraction, opening, closing, mobilization of tissue.  His assistance was a medical necessity.   PUS D: 02/02/2023 5:23:45 pm T: 02/02/2023 7:17:00 pm  JOB: [id number]/ [id number]

## 2023-02-02 NOTE — Anesthesia Procedure Notes (Signed)
Procedure Name: Intubation Date/Time: 02/02/2023 12:47 PM  Performed by: Gus Puma, CRNAPre-anesthesia Checklist: Patient identified, Emergency Drugs available, Suction available and Patient being monitored Patient Re-evaluated:Patient Re-evaluated prior to induction Oxygen Delivery Method: Circle System Utilized Preoxygenation: Pre-oxygenation with 100% oxygen Induction Type: IV induction Laryngoscope Size: Mac and 3 Grade View: Grade I Tube type: Oral Tube size: 7.0 mm Number of attempts: 1 Airway Equipment and Method: Stylet Placement Confirmation: ETT inserted through vocal cords under direct vision, positive ETCO2 and breath sounds checked- equal and bilateral Secured at: 21 cm Tube secured with: Tape Dental Injury: Teeth and Oropharynx as per pre-operative assessment  Comments: RSI -- no mask ventilation attempted  Grade I view with MAC 3 blade

## 2023-02-02 NOTE — H&P (Signed)
TOTAL KNEE REVISION ADMISSION H&P  Patient is being admitted for right revision total knee arthroplasty.  Subjective:  Chief Complaint:right knee pain.  HPI: [name] [name], 67 y.o. female, has a history of pain and functional disability in the right knee(s) due to failed previous arthroplasty and patient has failed non-surgical conservative treatments for greater than 12 weeks to include NSAID's and/or analgesics, flexibility and strengthening excercises, and activity modification. The indications for the revision of the total knee arthroplasty are  refractory pain and metal allergy . Onset of symptoms was abrupt starting at the time of the implant with continually worsening  course since that time.  Prior procedures on the right knee(s) include arthroplasty.  Patient currently rates pain in the right knee(s) at 8 out of 10 with activity. There is night pain, worsening of pain with activity and weight bearing, pain that interferes with activities of daily living, pain with passive range of motion, crepitus, and joint swelling.  Patient has evidence of well-seated and aligned prosthesis by imaging studies. This condition presents safety issues increasing the risk of falls. This patient has had extensive workup including negative infection workup with aspiration and blood draws, no evidence of loosening by CT scan.  No evidence of lumbar radicular pain by MRI scanning of the back.  Patient has had metal allergy testing which is positive both for nickel and cobalt.  Patient also describes warmth and pruritus around the incision and the knee since the operation..  There is no current active infection.   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   Primary hypertension 03/09/2022   Osteoarthritis of right knee 08/27/2021   S/P total knee arthroplasty, 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   COVID-19 virus infection 04/09/2021   Dyslipidemia 03/31/2021   Urinary  frequency 10/29/2017   Diabetes mellitus without complication (HCC) 09/14/2014   Special screening for malignant neoplasms, colon 11/25/2011   Diverticulosis of colon (without mention of hemorrhage) 11/25/2011   URTICARIA 01/31/2010   ECZEMA 05/03/2009   Past Medical History:  Diagnosis Date   Arthritis    Complication of anesthesia    Diabetes mellitus without complication (HCC)    Gallstones    Hypertension    PONV (postoperative nausea and vomiting)    Ruptured ovarian cyst     Past Surgical History:  Procedure Laterality Date   CESAREAN SECTION     COLONOSCOPY  06/05/2019   per Dr. Orvan Falconer, single adenomatous polyp, repeat in 7 yrs   KNEE ARTHROPLASTY Right 08/27/2021   Procedure: COMPUTER ASSISTED TOTAL KNEE ARTHROPLASTY;  Surgeon: Samson Frederic, MD;  Location: [facility];  Service: Orthopedics;  Laterality: Right;   KNEE CLOSED REDUCTION Right 11/20/2021   Procedure: CLOSED MANIPULATION KNEE;  Surgeon: Samson Frederic, MD;  Location: [facility];  Service: Orthopedics;  Laterality: Right;   lumpectomy,benign, right breast     x2   RIGHT SHOULDER SURGERY       Current Facility-Administered Medications  Medication Dose Route Frequency Provider Last Rate Last Admin   ceFAZolin (ANCEF) 2-4 GM/100ML-% IVPB            ceFAZolin (ANCEF) IVPB 2g/100 mL premix  2 g Intravenous On Call to OR Magnant, Charles L, PA-C       insulin aspart (novoLOG) injection 0-14 Units  0-14 Units Subcutaneous Q2H PRN Leilani Able, MD       lactated ringers infusion   Intravenous Continuous Eilene Ghazi, MD 10 mL/hr at 02/02/23 1037  New Bag at 02/02/23 1037   povidone-iodine (BETADINE) 7.5 % scrub   Topical Once Magnant, Charles L, PA-C       povidone-iodine 10 % swab 2 Application  2 Application Topical Once Magnant, Charles L, PA-C       povidone-iodine 10 % swab 2 Application  2 Application Topical Once Magnant, Charles L, PA-C       tranexamic acid (CYKLOKAPRON) 1000MG /122mL IVPB             tranexamic acid (CYKLOKAPRON) 2,000 mg in sodium chloride 0.9 % 50 mL Topical Application  2,000 mg Topical To OR Magnant, Charles L, PA-C       tranexamic acid (CYKLOKAPRON) IVPB 1,000 mg  1,000 mg Intravenous To OR Magnant, Charles L, PA-C       Allergies  Allergen Reactions   Hydrocodone Bit-Homatrop Mbr Nausea And Vomiting    Social History   Tobacco Use   Smoking status: Never    Passive exposure: Never   Smokeless tobacco: Never  Substance Use Topics   Alcohol use: Yes    Alcohol/week: 0.0 standard drinks of alcohol    Comment: maybe twice a month    Family History  Problem Relation Age of Onset   Cancer Other        breast cancer   Colon cancer Neg Hx    Esophageal cancer Neg Hx    Stomach cancer Neg Hx    Rectal cancer Neg Hx       Review of Systems  Musculoskeletal:  Positive for arthralgias.  All other systems reviewed and are negative.    Objective:  Physical Exam Vitals reviewed.  HENT:     Head: Normocephalic.     Nose: Nose normal.     Mouth/Throat:     Mouth: Mucous membranes are moist.  Eyes:     Pupils: Pupils are equal, round, and reactive to light.  Cardiovascular:     Rate and Rhythm: Normal rate.     Pulses: Normal pulses.  Pulmonary:     Effort: Pulmonary effort is normal.  Abdominal:     General: Abdomen is flat.  Musculoskeletal:     Cervical back: Normal range of motion.  Skin:    Capillary Refill: Capillary refill takes less than 2 seconds.  Neurological:     General: No focal deficit present.     Mental Status: She is alert.  Psychiatric:        Mood and Affect: Mood normal.   Right knee demonstrates range of motion of 0-85.  Collaterals are stable to varus valgus stress at 0 30 and 90 degrees.  Patella tracks well.  Knee is mildly stiff.  Patella mobility slightly diminished.  No effusion in the knee.  No groin pain with internal or external rotation of the right leg.  No nerve root tension signs on the right.  Prior hip  radiographs showed no arthritis in the right hip.  No color change right knee versus left knee and no temperature difference other than slight warmth at times per patient history which is to be expected right knee versus left knee. Vital signs in last 24 hours: Temp:  [98.2 F (36.8 C)] 98.2 F (36.8 C) (05/28 0942) Pulse Rate:  [71] 71 (05/28 0942) Resp:  [18] 18 (05/28 0942) BP: (145)/(63) 145/63 (05/28 0942) SpO2:  [99 %] 99 % (05/28 0942) Weight:  [63.8 kg] 63.8 kg (05/28 0942)  Labs:  Estimated body mass index is 26.57 kg/m as calculated  from the following:   Height as of this encounter: 5\' 1"  (1.549 m).   Weight as of this encounter: 63.8 kg.  Imaging Review Plain radiographs demonstrate  well aligned prosthesis  of the right knee(s). The overall alignment is neutral.There is no evidence of loosening of the femoral, tibial, and patellar components. The bone quality appears to be good for age and reported activity level. There is no radiographic evidence of patellar maltracking..    Assessment/Plan:  End stage arthritis, right knee(s) with failed previous arthroplasty.   The patient history, physical examination, clinical judgment of the provider and imaging studies are consistent with end stage degenerative joint disease of the right knee(s), previous total knee arthroplasty. Revision total knee arthroplasty is deemed medically necessary. The treatment options including medical management, injection therapy, arthroscopy and revision arthroplasty were discussed at length. The risks and benefits of revision total knee arthroplasty were presented and reviewed. The risks due to aseptic loosening, infection, stiffness, patella tracking problems, thromboembolic complications and other imponderables were discussed. The patient acknowledged the explanation, agreed to proceed with the plan and consent was signed. Patient is being admitted for inpatient treatment for surgery, pain control, PT,  OT, prophylactic antibiotics, VTE prophylaxis, progressive ambulation and ADL's and discharge planning.The patient is planning to be discharged home with home health services extensive and longstanding discussions have been made with the patient's family and the patient.  The risk and benefits of the procedure are discussed including not limited to infection or vessel damage incomplete pain relief as well as loss of function.  The patient did have a manipulation after her index procedure which really did not help her range of motion that much.  Currently she does bend from about 0 to 85 degrees.  Entirely possible that this may not help her situation but she is at the point with pain that she wants to proceed.  No evidence of reflex sympathetic dystrophy affecting the knee.

## 2023-02-02 NOTE — Anesthesia Postprocedure Evaluation (Signed)
Anesthesia Post Note  Patient: [name] [name]  Procedure(s) Performed: REVISION RIGHT TOTAL KNEE ARTHROPLASTY (Right: Knee)     Patient location during evaluation: PACU Anesthesia Type: General Level of consciousness: sedated Pain management: pain level controlled Vital Signs Assessment: post-procedure vital signs reviewed and stable Respiratory status: spontaneous breathing Cardiovascular status: stable Postop Assessment: no apparent nausea or vomiting Anesthetic complications: no  No notable events documented.  Last Vitals:  Vitals:   02/02/23 1900 02/02/23 1915  BP: (!) 113/55 (!) 106/53  Pulse: 73 66  Resp: 13 14  Temp:    SpO2: 95% 96%    Last Pain:  Vitals:   02/02/23 1900  PainSc: Asleep                 Caren Macadam

## 2023-02-02 NOTE — Brief Op Note (Signed)
   02/02/2023  5:13 PM  PATIENT:  [name] [name]  67 y.o. female  PRE-OPERATIVE DIAGNOSIS:  painful right total knee arthroplasty  POST-OPERATIVE DIAGNOSIS:  painful right total knee arthroplasty  PROCEDURE:  Procedure(s): REVISION RIGHT TOTAL KNEE ARTHROPLASTY  SURGEON:  Surgeon(s): August Saucer, Corrie Mckusick, MD  ASSISTANT: Karenann Cai, PA  ANESTHESIA:   General after attempted spinal  EBL: 100 cc ml    Total I/O In: 1000 [I.V.:1000] Out: 1450 [Urine:1400; Blood:50]  BLOOD ADMINISTERED: none  DRAINS: None  LOCAL MEDICATIONS USED: Marcaine morphine clonidine Exparel vancomycin powder  SPECIMEN: Cultures x 2  COUNTS:  YES  TOURNIQUET:   Total Tourniquet Time Documented: Thigh (Right) - 120 minutes Thigh (Right) - 19 minutes Total: Thigh (Right) - 139 minutes   DICTATION: .Other Dictation: Dictation Number [id number]  PLAN OF CARE: Admit for overnight observation  PATIENT DISPOSITION:  PACU - hemodynamically stable

## 2023-02-02 NOTE — Anesthesia Preprocedure Evaluation (Addendum)
Anesthesia Evaluation  Patient identified by MRN, date of birth, ID band Patient awake    Reviewed: Allergy & Precautions, NPO status , Patient's Chart, lab work & pertinent test results  History of Anesthesia Complications (+) PONV and history of anesthetic complications  Airway Mallampati: I       Dental no notable dental hx.    Pulmonary neg pulmonary ROS   Pulmonary exam normal        Cardiovascular hypertension, Pt. on medications Normal cardiovascular exam     Neuro/Psych negative neurological ROS  negative psych ROS   GI/Hepatic negative GI ROS, Neg liver ROS,,,  Endo/Other  diabetes, Type 2, Oral Hypoglycemic Agents    Renal/GU negative Renal ROS  negative genitourinary   Musculoskeletal  (+) Arthritis , Osteoarthritis,    Abdominal Normal abdominal exam  (+)   Peds  Hematology negative hematology ROS (+)   Anesthesia Other Findings   Reproductive/Obstetrics                              Anesthesia Physical Anesthesia Plan  ASA: 2  Anesthesia Plan: Spinal   Post-op Pain Management: Regional block*   Induction: Intravenous  PONV Risk Score and Plan: 4 or greater and Ondansetron, Dexamethasone, Propofol infusion, TIVA and Midazolam  Airway Management Planned:   Additional Equipment: None  Intra-op Plan:   Post-operative Plan:   Informed Consent: I have reviewed the patients History and Physical, chart, labs and discussed the procedure including the risks, benefits and alternatives for the proposed anesthesia with the patient or authorized representative who has indicated his/her understanding and acceptance.     Dental advisory given  Plan Discussed with: CRNA  Anesthesia Plan Comments:          Anesthesia Quick Evaluation

## 2023-02-03 ENCOUNTER — Telehealth: Payer: Self-pay | Admitting: Orthopedic Surgery

## 2023-02-03 DIAGNOSIS — T8453XA Infection and inflammatory reaction due to internal right knee prosthesis, initial encounter: Secondary | ICD-10-CM | POA: Diagnosis not present

## 2023-02-03 LAB — GLUCOSE, CAPILLARY
Glucose-Capillary: 155 mg/dL — ABNORMAL HIGH (ref 70–99)
Glucose-Capillary: 148 mg/dL — ABNORMAL HIGH (ref 70–99)
Glucose-Capillary: 82 mg/dL (ref 70–99)

## 2023-02-03 LAB — AEROBIC/ANAEROBIC CULTURE W GRAM STAIN (SURGICAL/DEEP WOUND): Gram Stain: NONE SEEN

## 2023-02-03 MED ORDER — CELECOXIB 100 MG PO CAPS: 100.0000 mg | ORAL_CAPSULE | Freq: Two times a day (BID) | ORAL | 0 refills | Status: DC

## 2023-02-03 MED ORDER — ASPIRIN 81 MG PO CHEW: 81.0000 mg | CHEWABLE_TABLET | Freq: Two times a day (BID) | ORAL | 0 refills | Status: DC

## 2023-02-03 MED ORDER — OXYCODONE HCL 5 MG PO TABS: 5.0000 mg | ORAL_TABLET | ORAL | 0 refills | Status: DC | PRN

## 2023-02-03 MED ORDER — SODIUM CHLORIDE 0.9 % IV BOLUS
500.0000 mL | Freq: Once | INTRAVENOUS | Status: AC
  Administered 2023-02-03: 500 mL via INTRAVENOUS

## 2023-02-03 MED ORDER — METHOCARBAMOL 500 MG PO TABS: 500.0000 mg | ORAL_TABLET | Freq: Three times a day (TID) | ORAL | 0 refills | Status: AC | PRN

## 2023-02-03 MED ORDER — DOCUSATE SODIUM 100 MG PO CAPS: 100.0000 mg | ORAL_CAPSULE | Freq: Two times a day (BID) | ORAL | 0 refills | Status: DC

## 2023-02-03 NOTE — Evaluation (Signed)
Physical Therapy Evaluation Patient Details Name: [name] [name] MRN: [id number] DOB: [birth year] Today's Date: 02/03/2023  History of Present Illness  Patient is 67 y.o. female s/p Rt TKR on 02/02/23 with PMH significant for OA, DM, HTN, initial Rt TKA 08/27/2021.  Clinical Impression  [name] [name] is a 67 y.o. female POD 1 s/p Rt TKA revision. Patient reports independence with no AD for mobility at baseline. Patient is now limited by functional impairments (see PT problem list below) and requires min guard/supervision for transfers and gait with RW. Patient was able to ambulate ~200 feet with RW and Min guard/supervision. Patient educated on safe sequencing for stair mobility and family present and verbalized safe guarding position for to assist with mobility. Patient instructed in exercises to facilitate ROM and circulation. Patient will benefit from continued skilled PT interventions to address impairments and progress towards PLOF. Patient has met mobility goals at adequate level for discharge home; will continue to follow if pt continues acute stay to progress towards Mod I goals.      Recommendations for follow up therapy are one component of a multi-disciplinary discharge planning process, led by the attending physician.  Recommendations may be updated based on patient status, additional functional criteria and insurance authorization.  Follow Up Recommendations       Assistance Recommended at Discharge Frequent or constant Supervision/Assistance  Patient can return home with the following  A little help with walking and/or transfers;A little help with bathing/dressing/bathroom;Assistance with cooking/housework;Assist for transportation;Help with stairs or ramp for entrance    Equipment Recommendations Rolling walker (2 wheels) (youth)  Recommendations for Other Services       Functional Status Assessment Patient has had a recent decline in their functional status and demonstrates the  ability to make significant improvements in function in a reasonable and predictable amount of time.     Precautions / Restrictions Precautions Precautions: Fall Restrictions Weight Bearing Restrictions: No RLE Weight Bearing: Weight bearing as tolerated Other Position/Activity Restrictions: knee immobilzer OOB      Mobility  Bed Mobility Overal bed mobility: Modified Independent             General bed mobility comments: use of bed features    Transfers Overall transfer level: Needs assistance Equipment used: Standard walker Transfers: Sit to/from Stand Sit to Stand: Min guard, Supervision           General transfer comment: guard/supervision with pt demonstrating safe management of RW for transfers.    Ambulation/Gait Ambulation/Gait assistance: Min guard, Supervision Gait Distance (Feet): 200 Feet Assistive device: Rolling walker (2 wheels) Gait Pattern/deviations: Step-to pattern, Step-through pattern, Decreased stride length, Decreased weight shift to right Gait velocity: decr     General Gait Details: pt maintained safe proximity to RW, no LOB noted throughout, BP stable 100/50's.  Stairs Stairs: Yes Stairs assistance: Min guard, Supervision Stair Management: One rail Right, Step to pattern, Sideways, No rails, With walker Number of Stairs: 3 (2 with rail, 1 curb) General stair comments: pt steady with side step technique, no LOB, son present and verablized safe guarding position wiht PT demonstrating.  Wheelchair Mobility    Modified Rankin (Stroke Patients Only)       Balance                                             Pertinent Vitals/Pain Pain Assessment Pain Assessment: Faces  Faces Pain Scale: Hurts a little bit Pain Location: Rt knee Pain Descriptors / Indicators: Discomfort Pain Intervention(s): Limited activity within patient's tolerance, Monitored during session, Repositioned    Home Living Family/patient  expects to be discharged to:: Private residence Living Arrangements: Spouse/significant other Available Help at Discharge: Family Type of Home: House Home Access: Stairs to enter Entrance Stairs-Rails: Doctor, general practice of Steps: 3+1   Home Layout: One level Home Equipment: Cane - single point      Prior Function Prior Level of Function : Independent/Modified Independent;Working/employed;Driving                     Hand Dominance   Dominant Hand: Right    Extremity/Trunk Assessment   Upper Extremity Assessment Upper Extremity Assessment: Overall WFL for tasks assessed    Lower Extremity Assessment Lower Extremity Assessment: RLE deficits/detail RLE Deficits / Details: good quad activation, no extensor lag with SLR. immobilizer in place per order/note. RLE Sensation: WNL RLE Coordination: WNL    Cervical / Trunk Assessment Cervical / Trunk Assessment: Normal  Communication   Communication: No difficulties  Cognition Arousal/Alertness: Awake/alert Behavior During Therapy: WFL for tasks assessed/performed Overall Cognitive Status: Within Functional Limits for tasks assessed                                          General Comments      Exercises Total Joint Exercises Ankle Circles/Pumps: AROM, Both, 5 reps Quad Sets: AROM, Right, Other reps (comment) (2) Short Arc Quad: AROM, Right, Other reps (comment) (2) Heel Slides: AROM, Right, Other reps (comment) (2) Hip ABduction/ADduction: AROM, Right, Other reps (comment) (2) Straight Leg Raises: AROM, Right, Other reps (comment) (2)   Assessment/Plan    PT Assessment Patient needs continued PT services  PT Problem List Decreased strength;Decreased range of motion;Decreased activity tolerance;Decreased balance;Decreased mobility;Decreased knowledge of use of DME;Decreased safety awareness;Decreased knowledge of precautions;Pain       PT Treatment Interventions Gait  training;DME instruction;Stair training;Functional mobility training;Therapeutic activities;Therapeutic exercise;Balance training;Patient/family education    PT Goals (Current goals can be found in the Care Plan section)  Acute Rehab PT Goals PT Goal Formulation: With patient/family Time For Goal Achievement: 02/10/23 Potential to Achieve Goals: Good    Frequency Min 3X/week     Co-evaluation               AM-PAC PT "6 Clicks" Mobility  Outcome Measure Help needed turning from your back to your side while in a flat bed without using bedrails?: None Help needed moving from lying on your back to sitting on the side of a flat bed without using bedrails?: None Help needed moving to and from a bed to a chair (including a wheelchair)?: A Little Help needed standing up from a chair using your arms (e.g., wheelchair or bedside chair)?: A Little Help needed to walk in hospital room?: A Little Help needed climbing 3-5 steps with a railing? : A Little 6 Click Score: 20    End of Session Equipment Utilized During Treatment: Gait belt Activity Tolerance: Patient tolerated treatment well Patient left: in chair;with call bell/phone within reach;with family/visitor present Nurse Communication: Mobility status PT Visit Diagnosis: Muscle weakness (generalized) (M62.81);Other abnormalities of gait and mobility (R26.89);Difficulty in walking, not elsewhere classified (R26.2)    Time: 1610-9604 PT Time Calculation (min) (ACUTE ONLY): 41 min   Charges:  PT Evaluation $PT Eval Low Complexity: 1 Low PT Treatments $Gait Training: 8-22 mins $Therapeutic Exercise: 8-22 mins        Wynn Maudlin, DPT Acute Rehabilitation Services Office [phone]  02/03/23 2:09 PM

## 2023-02-03 NOTE — Plan of Care (Addendum)
Pt alert and oriented. Discharge teaching provided to patient and family. IV access removed. Patient transported to lobby via wheelchair for private transportation home.  Problem: Education: Goal: Knowledge of General Education information will improve Description: Including pain rating scale, medication(s)/side effects and non-pharmacologic comfort measures Outcome: Adequate for Discharge   Problem: Health Behavior/Discharge Planning: Goal: Ability to manage health-related needs will improve Outcome: Adequate for Discharge   Problem: Clinical Measurements: Goal: Ability to maintain clinical measurements within normal limits will improve Outcome: Adequate for Discharge Goal: Will remain free from infection Outcome: Adequate for Discharge Goal: Diagnostic test results will improve Outcome: Adequate for Discharge Goal: Respiratory complications will improve Outcome: Adequate for Discharge Goal: Cardiovascular complication will be avoided Outcome: Adequate for Discharge   Problem: Activity: Goal: Risk for activity intolerance will decrease Outcome: Adequate for Discharge   Problem: Nutrition: Goal: Adequate nutrition will be maintained Outcome: Adequate for Discharge   Problem: Coping: Goal: Level of anxiety will decrease Outcome: Adequate for Discharge   Problem: Elimination: Goal: Will not experience complications related to bowel motility Outcome: Adequate for Discharge Goal: Will not experience complications related to urinary retention Outcome: Adequate for Discharge   Problem: Pain Managment: Goal: General experience of comfort will improve Outcome: Adequate for Discharge   Problem: Safety: Goal: Ability to remain free from injury will improve Outcome: Adequate for Discharge   Problem: Skin Integrity: Goal: Risk for impaired skin integrity will decrease Outcome: Adequate for Discharge   Problem: Education: Goal: Knowledge of the prescribed therapeutic regimen  will improve Outcome: Adequate for Discharge Goal: Individualized Educational Video(s) Outcome: Adequate for Discharge   Problem: Activity: Goal: Ability to avoid complications of mobility impairment will improve Outcome: Adequate for Discharge Goal: Range of joint motion will improve Outcome: Adequate for Discharge   Problem: Clinical Measurements: Goal: Postoperative complications will be avoided or minimized Outcome: Adequate for Discharge   Problem: Pain Management: Goal: Pain level will decrease with appropriate interventions Outcome: Adequate for Discharge   Problem: Skin Integrity: Goal: Will show signs of wound healing Outcome: Adequate for Discharge   Problem: Education: Goal: Ability to describe self-care measures that may prevent or decrease complications (Diabetes Survival Skills Education) will improve Outcome: Adequate for Discharge Goal: Individualized Educational Video(s) Outcome: Adequate for Discharge   Problem: Coping: Goal: Ability to adjust to condition or change in health will improve Outcome: Adequate for Discharge   Problem: Fluid Volume: Goal: Ability to maintain a balanced intake and output will improve Outcome: Adequate for Discharge   Problem: Health Behavior/Discharge Planning: Goal: Ability to identify and utilize available resources and services will improve Outcome: Adequate for Discharge Goal: Ability to manage health-related needs will improve Outcome: Adequate for Discharge   Problem: Metabolic: Goal: Ability to maintain appropriate glucose levels will improve Outcome: Adequate for Discharge   Problem: Nutritional: Goal: Maintenance of adequate nutrition will improve Outcome: Adequate for Discharge Goal: Progress toward achieving an optimal weight will improve Outcome: Adequate for Discharge   Problem: Skin Integrity: Goal: Risk for impaired skin integrity will decrease Outcome: Adequate for Discharge   Problem: Tissue  Perfusion: Goal: Adequacy of tissue perfusion will improve Outcome: Adequate for Discharge

## 2023-02-03 NOTE — TOC Initial Note (Addendum)
Transition of Care [facility]) - Initial/Assessment Note    Patient Details  Name: [name] [name] MRN: [id number] Date of Birth: 08/10/1956  Transition of Care [facility]) CM/SW Contact:    Epifanio Lesches, RN Phone Number: 02/03/2023, 1:51 PM  Clinical Narrative:                   -  s/p right TKA revision, 5/28 From home with spouse/ family. PTA independent with ADL's. Pt states already with CPM @ home. Referral made with [employer] for RW. Equipment will be delivered to bedside prior to d/c. [facility] will provide home health services, prearranged by provider's office. Husband /son to assist with care once d/c to home. Family to provide transportation to home.  Pt without RX med concerns. Post hospital f/u  noted on AVS.   TOC team following....  Barriers to Discharge: No Barriers Identified   Patient Goals and CMS Choice     Choice offered to / list presented to : Patient      Expected Discharge Plan and Services   Discharge Planning Services: CM Consult                     DME Arranged: Dan Humphreys rolling DME Agency: [employer] Date DME Agency Contacted: 02/03/23 Time DME Agency Contacted: 1159 Representative spoke with at DME Agency: Vaughan Basta HH Arranged: PT HH Agency: [facility] Date [facility] Agency Contacted: 02/03/23 Time HH Agency Contacted: 1200 Representative spoke with at [facility] Agency: Tresa Endo  Prior Living Arrangements/Services   Lives with:: Spouse                   Activities of Daily Living Home Assistive Devices/Equipment: Eyeglasses ADL Screening (condition at time of admission) Patient's cognitive ability adequate to safely complete daily activities?: Yes Is the patient deaf or have difficulty hearing?: No Does the patient have difficulty seeing, even when wearing glasses/contacts?: No Does the patient have difficulty concentrating, remembering, or making decisions?: No Patient able to express need for assistance with ADLs?:  Yes Does the patient have difficulty dressing or bathing?: No Independently performs ADLs?: Yes (appropriate for developmental age) Does the patient have difficulty walking or climbing stairs?: Yes Weakness of Legs: Left Weakness of Arms/Hands: None  Permission Sought/Granted                  Emotional Assessment              Admission diagnosis:  S/P revision of total knee, right [Z96.651] Patient Active Problem List   Diagnosis Date Noted   S/P revision of total knee, right 02/02/2023   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   Primary hypertension 03/09/2022   Osteoarthritis of right knee 08/27/2021   S/P total knee arthroplasty, 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   COVID-19 virus infection 04/09/2021   Dyslipidemia 03/31/2021   Urinary frequency 10/29/2017   Diabetes mellitus without complication (HCC) 09/14/2014   Special screening for malignant neoplasms, colon 11/25/2011   Diverticulosis of colon (without mention of hemorrhage) 11/25/2011   URTICARIA 01/31/2010   ECZEMA 05/03/2009   PCP:  Nelwyn Salisbury, MD Pharmacy:   [facility] [address], [place] - [address] [place] [address] Phone: [phone] Fax: [phone]     Social Determinants of Health (SDOH) Social History: SDOH Screenings   Food Insecurity: No Food Insecurity (02/03/2023)  Housing: Patient Declined (02/03/2023)  Transportation Needs: No Transportation Needs (02/03/2023)  Utilities:  Not At Risk (02/03/2023)  Depression (PHQ2-9): High Risk (01/06/2023)  Tobacco Use: Low Risk  (02/02/2023)   SDOH Interventions:     Readmission Risk Interventions     No data to display

## 2023-02-03 NOTE — Progress Notes (Signed)
Orthopedic Tech Progress Note Patient Details:  [name] [name] [birth year] [id number]  Some how CPM was not done last night, went to apply CPM this morning and patient was having breakfast with her husband,   Patient ID: [name] [name], female   DOB: October 21, 1955, 67 y.o.   MRN: [id number]  [name] [name] 02/03/2023, 9:08 AM

## 2023-02-03 NOTE — Telephone Encounter (Signed)
Called and advised.

## 2023-02-03 NOTE — Telephone Encounter (Signed)
Note done. Do you mind emailing it to the pt

## 2023-02-03 NOTE — Progress Notes (Signed)
  Subjective: [name] [name] is a 67 y.o. female s/p right TKA revision.  They are POD 1.  Pt's pain is controlled.  Pt denies any complain of chest pain, shortness of breath, abdominal pain, calf pain.  Patient denies any fevers or chills.  No complaint of dizziness or lightheadedness.  Does not have any complaints aside from knee soreness.  She has not been ambulatory yet.  Not work with physical therapy yet.  She states she has no steps to get into her house.  Objective: Vital signs in last 24 hours: Temp:  [97.6 F (36.4 C)-98.3 F (36.8 C)] 97.6 F (36.4 C) (05/29 0733) Pulse Rate:  [60-105] 60 (05/29 0733) Resp:  [10-18] 18 (05/29 0733) BP: (97-136)/(49-58) 97/49 (05/29 0733) SpO2:  [95 %-100 %] 100 % (05/29 0733)  Intake/Output from previous day: 05/28 0701 - 05/29 0700 In: 2200 [I.V.:2200] Out: 1850 [Urine:1800; Blood:50] Intake/Output this shift: Total I/O In: 240 [P.O.:240] Out: 240 [Urine:240]  Exam:  No gross blood or drainage overlying the dressing 2+ DP pulse Sensation intact distally in the operative foot Able to dorsiflex and plantarflex the operative foot No calf tenderness.  Negative Homans' sign. Able to perform straight leg raise   Labs: No results for input(s): "HGB" in the last 72 hours. No results for input(s): "WBC", "RBC", "HCT", "PLT" in the last 72 hours. No results for input(s): "NA", "K", "CL", "CO2", "BUN", "CREATININE", "GLUCOSE", "CALCIUM" in the last 72 hours. No results for input(s): "LABPT", "INR" in the last 72 hours.  Assessment/Plan: Pt is POD 1 s/p TKA.    -Plan to discharge to home today or tomorrow pending patient's pain and PT eval  -WBAT with a walker  -Follow-up with Dr. August Saucer in clinic 2 weeks postoperatively    [facility] 02/03/2023, 11:49 AM

## 2023-02-03 NOTE — Telephone Encounter (Signed)
Patient requesting a letter stating that she will be out of work until she returns to see Dr. August Saucer. Please email to [email or web address]

## 2023-02-04 ENCOUNTER — Encounter (HOSPITAL_COMMUNITY): Payer: Self-pay | Admitting: Orthopedic Surgery

## 2023-02-04 ENCOUNTER — Telehealth: Payer: Self-pay

## 2023-02-04 LAB — AEROBIC/ANAEROBIC CULTURE W GRAM STAIN (SURGICAL/DEEP WOUND)

## 2023-02-04 NOTE — Telephone Encounter (Signed)
Called and advised.

## 2023-02-04 NOTE — Telephone Encounter (Signed)
Patient needing an work note? Please advise how long should be out of work.

## 2023-02-04 NOTE — Transitions of Care (Post Inpatient/ED Visit) (Signed)
02/04/2023  Name: [name] [name] MRN: [id number] DOB: 1955/12/14  Today's TOC FU Call Status: Today's TOC FU Call Status:: Successful TOC FU Call Competed TOC FU Call Complete Date: 02/04/23  Transition Care Management Follow-up Telephone Call Date of Discharge: 02/03/23 Discharge Facility: [facility]) Type of Discharge: Emergency Department Reason for ED Visit: Other: How have you been since you were released from the hospital?: Better Any questions or concerns?: No  Items Reviewed: Did you receive and understand the discharge instructions provided?: Yes Medications obtained,verified, and reconciled?: Yes (Medications Reviewed) Any new allergies since your discharge?: No Dietary orders reviewed?: NA Do you have support at home?: Yes People in Home: spouse  Medications Reviewed Today: Medications Reviewed Today     Reviewed by Annabell Sabal, CMA (Certified Medical Assistant) on 02/04/23 at 1013  Med List Status: <None>   Medication Order Taking? Sig Documenting Provider Last Dose Status Informant  aspirin 81 MG chewable tablet [id number] Yes Chew 1 tablet (81 mg total) by mouth 2 (two) times daily. Magnant, Joycie Peek, PA-C Taking Active   celecoxib (CELEBREX) 100 MG capsule [id number] Yes Take 1 capsule (100 mg total) by mouth 2 (two) times daily. Magnant, Joycie Peek, PA-C Taking Active   docusate sodium (COLACE) 100 MG capsule [id number] Yes Take 1 capsule (100 mg total) by mouth 2 (two) times daily. Magnant, Joycie Peek, PA-C Taking Active   losartan (COZAAR) 50 MG tablet [id number] Yes Take 1 tablet (50 mg total) by mouth daily. Nelwyn Salisbury, MD Taking Active Self  metFORMIN (GLUCOPHAGE) 1000 MG tablet [id number] Yes Take 0.5 tablets (500 mg total) by mouth 2 (two) times daily with a meal. Nelwyn Salisbury, MD Taking Active Self  methocarbamol (ROBAXIN) 500 MG tablet [id number] Yes Take 1 tablet (500 mg total) by mouth every 8 (eight) hours as needed for muscle spasms. Magnant,  Joycie Peek, PA-C Taking Active   Omega-3 Fatty Acids (FISH OIL PO) [id number] Yes Take 1 capsule by mouth daily. [provider] Taking Active Self  oxyCODONE (OXY IR/ROXICODONE) 5 MG immediate release tablet [id number] Yes Take 1 tablet (5 mg total) by mouth every 4 (four) hours as needed for moderate pain (pain score 4-6). Magnant, Joycie Peek, PA-C Taking Active   sitaGLIPtin (JANUVIA) 50 MG tablet [id number] Yes Take 1 tablet (50 mg total) by mouth daily. Nelwyn Salisbury, MD Taking Active Self            Home Care and Equipment/Supplies: Were Home Health Services Ordered?: Yes [facility] Has Agency set up a time to come to your home?: Yes First Home Health Visit Date:  (patient hasn't been contacted yet) Any new equipment or medical supplies ordered?: Yes Name of Medical supply agency?: [facility] Were you able to get the equipment/medical supplies?: Yes Do you have any questions related to the use of the equipment/supplies?: No  Functional Questionnaire: Do you need assistance with bathing/showering or dressing?: No Do you need assistance with meal preparation?: No Do you need assistance with eating?: No Do you have difficulty maintaining continence: No Do you need assistance with getting out of bed/getting out of a chair/moving?: No Do you have difficulty managing or taking your medications?: No  Follow up appointments reviewed: PCP Follow-up appointment confirmed?: No MD Provider Line Number:[phone] Given: Yes Specialist Hospital Follow-up appointment confirmed?: No Reason Specialist Follow-Up Not Confirmed: Patient has Specialist Provider Number and will Call for Appointment Do you need transportation to your follow-up appointment?: No  Do you understand care options if your condition(s) worsen?: Yes-patient verbalized understanding    SIGNATURE Fredirick Maudlin

## 2023-02-04 NOTE — Telephone Encounter (Signed)
Note completed 

## 2023-02-04 NOTE — Telephone Encounter (Signed)
3 months thx pls send

## 2023-02-05 LAB — AEROBIC/ANAEROBIC CULTURE W GRAM STAIN (SURGICAL/DEEP WOUND)

## 2023-02-06 LAB — AEROBIC/ANAEROBIC CULTURE W GRAM STAIN (SURGICAL/DEEP WOUND)

## 2023-02-07 DIAGNOSIS — T8484XA Pain due to internal orthopedic prosthetic devices, implants and grafts, initial encounter: Secondary | ICD-10-CM

## 2023-02-07 LAB — AEROBIC/ANAEROBIC CULTURE W GRAM STAIN (SURGICAL/DEEP WOUND): Culture: NO GROWTH

## 2023-02-11 NOTE — Discharge Summary (Signed)
Physician Discharge Summary      Patient ID: [name] [name] MRN: [id number] DOB/AGE: 1955-09-18 67 y.o.  Admit date: 02/02/2023 Discharge date: 02/03/2023  Admission Diagnoses:  Principal Problem:   S/P revision of total knee, right Active Problems:   Pain due to total right knee replacement [facility])   Discharge Diagnoses:  Same  Surgeries: Procedure(s): REVISION RIGHT TOTAL KNEE ARTHROPLASTY on 02/02/2023   Consultants:   Discharged Condition: Stable  Hospital Course: [name] [name] is an 67 y.o. female who was admitted 02/02/2023 with a chief complaint of right knee pain, and found to have a diagnosis of right knee painful hardware from total knee arthroplasty due to suspected metal allergy.  They were brought to the operating room on 02/02/2023 and underwent the above named procedures.  Pt awoke from anesthesia without complication and was transferred to the floor. On POD1, patient's pain was moderate but controlled.  She had no red flag signs or symptoms throughout her stay.  She was able to mobilize in an excellent fashion with physical therapy and she was called on the telephone later that evening after working with therapy and she requested discharge home and felt prepared.  Discharged home on POD 1..  Pt will f/u with Dr. August Saucer in clinic in ~2 weeks.   Antibiotics given:  Anti-infectives (From admission, onward)    Start     Dose/Rate Route Frequency Ordered Stop   02/02/23 2200  ceFAZolin (ANCEF) IVPB 1 g/50 mL premix        1 g 100 mL/hr over 30 Minutes Intravenous Every 8 hours 02/02/23 2011 02/03/23 0713   02/02/23 1539  vancomycin (VANCOCIN) powder  Status:  Discontinued          As needed 02/02/23 1540 02/02/23 1729   02/02/23 0950  ceFAZolin (ANCEF) 2-4 GM/100ML-% IVPB       Note to Pharmacy: Payton Emerald A: cabinet override      02/02/23 0950 02/02/23 1310   02/02/23 0945  ceFAZolin (ANCEF) IVPB 2g/100 mL premix        2 g 200 mL/hr over 30 Minutes Intravenous On call to  O.R. 02/02/23 4098 02/02/23 1305     .  Recent vital signs:  Vitals:   02/03/23 1240 02/03/23 1600  BP: (!) 90/45 (!) 101/51  Pulse: (!) 57 62  Resp: 19 14  Temp: 98.3 F (36.8 C) 98.4 F (36.9 C)  SpO2: 100% 97%    Recent laboratory studies:  Results for orders placed or performed during the hospital encounter of 02/02/23  Aerobic/Anaerobic Culture w Gram Stain (surgical/deep wound)   Specimen: PATH Cytology Misc. fluid; Body Fluid  Result Value Ref Range   Specimen Description WOUND    Special Requests KNEE IMPLANT ANTERIOR    Gram Stain NO WBC SEEN NO ORGANISMS SEEN     Culture      No growth aerobically or anaerobically. Performed at [facility], [address], [place], [place] [address]    Report Status 02/07/2023 FINAL   Aerobic/Anaerobic Culture w Gram Stain (surgical/deep wound)   Specimen: PATH Cytology Misc. fluid; Body Fluid  Result Value Ref Range   Specimen Description WOUND    Special Requests IMPLANT CULTURE    Gram Stain      FEW WBC PRESENT,BOTH PMN AND MONONUCLEAR NO ORGANISMS SEEN    Culture      No growth aerobically or anaerobically. Performed at [facility], [address] [place], [place] [address]  Report Status 02/07/2023 FINAL   Glucose, capillary  Result Value Ref Range   Glucose-Capillary 120 (H) 70 - 99 mg/dL  Glucose, capillary  Result Value Ref Range   Glucose-Capillary 110 (H) 70 - 99 mg/dL   Comment 1 Notify RN   Glucose, capillary  Result Value Ref Range   Glucose-Capillary 197 (H) 70 - 99 mg/dL  Glucose, capillary  Result Value Ref Range   Glucose-Capillary 199 (H) 70 - 99 mg/dL  Glucose, capillary  Result Value Ref Range   Glucose-Capillary 155 (H) 70 - 99 mg/dL  Glucose, capillary  Result Value Ref Range   Glucose-Capillary 148 (H) 70 - 99 mg/dL  Glucose, capillary  Result Value Ref Range   Glucose-Capillary 82 70 - 99 mg/dL    Discharge Medications:   Allergies as of 02/03/2023        Reactions   Hydrocodone Bit-homatrop Mbr Nausea And Vomiting        Medication List     STOP taking these medications    aspirin EC 81 MG tablet Replaced by: aspirin 81 MG chewable tablet   meloxicam 7.5 MG tablet Commonly known as: MOBIC       TAKE these medications    aspirin 81 MG chewable tablet Chew 1 tablet (81 mg total) by mouth 2 (two) times daily. Replaces: aspirin EC 81 MG tablet   celecoxib 100 MG capsule Commonly known as: CELEBREX Take 1 capsule (100 mg total) by mouth 2 (two) times daily.   docusate sodium 100 MG capsule Commonly known as: COLACE Take 1 capsule (100 mg total) by mouth 2 (two) times daily.   FISH OIL PO Take 1 capsule by mouth daily.   losartan 50 MG tablet Commonly known as: COZAAR Take 1 tablet (50 mg total) by mouth daily.   metFORMIN 1000 MG tablet Commonly known as: GLUCOPHAGE Take 0.5 tablets (500 mg total) by mouth 2 (two) times daily with a meal.   methocarbamol 500 MG tablet Commonly known as: ROBAXIN Take 1 tablet (500 mg total) by mouth every 8 (eight) hours as needed for muscle spasms.   oxyCODONE 5 MG immediate release tablet Commonly known as: Oxy IR/ROXICODONE Take 1 tablet (5 mg total) by mouth every 4 (four) hours as needed for moderate pain (pain score 4-6).   sitaGLIPtin 50 MG tablet Commonly known as: Januvia Take 1 tablet (50 mg total) by mouth daily.        Diagnostic Studies: DG Knee 1-2 Views Right  Result Date: 02/02/2023 CLINICAL DATA:  Status post right knee revision EXAM: RIGHT KNEE - 1-2 VIEW COMPARISON:  03/23/2022 FINDINGS: There is been revision of the right knee replacement within extender placed into the tibia. No acute fracture is seen. No soft tissue changes are noted. IMPRESSION: Status post right knee revision Electronically Signed   By: Alcide Clever M.D.   On: 02/02/2023 21:51    Disposition: Discharge disposition: 01-Home or Self Care       Discharge Instructions     Call MD  / Call 911   Complete by: As directed    If you experience chest pain or shortness of breath, CALL 911 and be transported to the hospital emergency room.  If you develope a fever above 101 F, pus (white drainage) or increased drainage or redness at the wound, or calf pain, call your surgeon's office.   Constipation Prevention   Complete by: As directed    Drink plenty of fluids.  Prune juice may be  helpful.  You may use a stool softener, such as Colace (over the counter) 100 mg twice a day.  Use MiraLax (over the counter) for constipation as needed.   Diet - low sodium heart healthy   Complete by: As directed    Discharge instructions   Complete by: As directed    You may shower, dressing is waterproof.  Do not remove the dressing, we will remove it at your first post-op appointment.  Do not take a bath or soak the knee in a tub or pool.  You may weightbear as you can tolerate on the operative leg with a walker.  Continue using the CPM machine 3 times per day for one hour each time, increasing the degrees of range of motion daily.  Use the blue cradle boot under your heel to work on getting your leg straight.  Do NOT put a pillow under your knee.  You will follow-up with Dr. August Saucer in the clinic in 2 weeks at your given appointment date.    INSTRUCTIONS AFTER JOINT REPLACEMENT   Remove items at home which could result in a fall. This includes throw rugs or furniture in walking pathways ICE to the affected joint every three hours while awake for 30 minutes at a time, for at least the first 3-5 days, and then as needed for pain and swelling.  Continue to use ice for pain and swelling. You may notice swelling that will progress down to the foot and ankle.  This is normal after surgery.  Elevate your leg when you are not up walking on it.   Continue to use the breathing machine you got in the hospital (incentive spirometer) which will help keep your temperature down.  It is common for your temperature to  cycle up and down following surgery, especially at night when you are not up moving around and exerting yourself.  The breathing machine keeps your lungs expanded and your temperature down.   DIET:  As you were doing prior to hospitalization, we recommend a well-balanced diet.  DRESSING / WOUND CARE / SHOWERING  Keep the surgical dressing until follow up.  The dressing is water proof, so you can shower without any extra covering.  IF THE DRESSING FALLS OFF or the wound gets wet inside, change the dressing with sterile gauze.  Please use good hand washing techniques before changing the dressing.  Do not use any lotions or creams on the incision until instructed by your surgeon.    ACTIVITY  Increase activity slowly as tolerated, but follow the weight bearing instructions below.   No driving for 6 weeks or until further direction given by your physician.  You cannot drive while taking narcotics.  No lifting or carrying greater than 10 lbs. until further directed by your surgeon. Avoid periods of inactivity such as sitting longer than an hour when not asleep. This helps prevent blood clots.  You may return to work once you are authorized by your doctor.     WEIGHT BEARING   Weight bearing as tolerated with assist device (walker, cane, etc) as directed, use it as long as suggested by your surgeon or therapist, typically at least 4-6 weeks.   EXERCISES  Results after joint replacement surgery are often greatly improved when you follow the exercise, range of motion and muscle strengthening exercises prescribed by your doctor. Safety measures are also important to protect the joint from further injury. Any time any of these exercises cause you to have increased pain  or swelling, decrease what you are doing until you are comfortable again and then slowly increase them. If you have problems or questions, call your caregiver or physical therapist for advice.   Rehabilitation is important following  a joint replacement. After just a few days of immobilization, the muscles of the leg can become weakened and shrink (atrophy).  These exercises are designed to build up the tone and strength of the thigh and leg muscles and to improve motion. Often times heat used for twenty to thirty minutes before working out will loosen up your tissues and help with improving the range of motion but do not use heat for the first two weeks following surgery (sometimes heat can increase post-operative swelling).   These exercises can be done on a training (exercise) mat, on the floor, on a table or on a bed. Use whatever works the best and is most comfortable for you.    Use music or television while you are exercising so that the exercises are a pleasant break in your day. This will make your life better with the exercises acting as a break in your routine that you can look forward to.   Perform all exercises about fifteen times, three times per day or as directed.  You should exercise both the operative leg and the other leg as well.  Exercises include:   Quad Sets - Tighten up the muscle on the front of the thigh (Quad) and hold for 5-10 seconds.   Straight Leg Raises - With your knee straight (if you were given a brace, keep it on), lift the leg to 60 degrees, hold for 3 seconds, and slowly lower the leg.  Perform this exercise against resistance later as your leg gets stronger.  Leg Slides: Lying on your back, slowly slide your foot toward your buttocks, bending your knee up off the floor (only go as far as is comfortable). Then slowly slide your foot back down until your leg is flat on the floor again.  Angel Wings: Lying on your back spread your legs to the side as far apart as you can without causing discomfort.  Hamstring Strength:  Lying on your back, push your heel against the floor with your leg straight by tightening up the muscles of your buttocks.  Repeat, but this time bend your knee to a comfortable  angle, and push your heel against the floor.  You may put a pillow under the heel to make it more comfortable if necessary.   A rehabilitation program following joint replacement surgery can speed recovery and prevent re-injury in the future due to weakened muscles. Contact your doctor or a physical therapist for more information on knee rehabilitation.    CONSTIPATION  Constipation is defined medically as fewer than three stools per week and severe constipation as less than one stool per week.  Even if you have a regular bowel pattern at home, your normal regimen is likely to be disrupted due to multiple reasons following surgery.  Combination of anesthesia, postoperative narcotics, change in appetite and fluid intake all can affect your bowels.   YOU MUST use at least one of the following options; they are listed in order of increasing strength to get the job done.  They are all available over the counter, and you may need to use some, POSSIBLY even all of these options:    Drink plenty of fluids (prune juice may be helpful) and high fiber foods Colace 100 mg by mouth twice a  day  Senokot for constipation as directed and as needed Dulcolax (bisacodyl), take with full glass of water  Miralax (polyethylene glycol) once or twice a day as needed.  If you have tried all these things and are unable to have a bowel movement in the first 3-4 days after surgery call either your surgeon or your primary doctor.    If you experience loose stools or diarrhea, hold the medications until you stool forms back up.  If your symptoms do not get better within 1 week or if they get worse, check with your doctor.  If you experience "the worst abdominal pain ever" or develop nausea or vomiting, please contact the office immediately for further recommendations for treatment.   ITCHING:  If you experience itching with your medications, try taking only a single pain pill, or even half a pain pill at a time.  You can  also use Benadryl over the counter for itching or also to help with sleep.   TED HOSE STOCKINGS:  Use stockings on both legs until for at least 2 weeks or as directed by physician office. They may be removed at night for sleeping.  MEDICATIONS:  See your medication summary on the "After Visit Summary" that nursing will review with you.  You may have some home medications which will be placed on hold until you complete the course of blood thinner medication.  It is important for you to complete the blood thinner medication as prescribed.  PRECAUTIONS:  If you experience chest pain or shortness of breath - call 911 immediately for transfer to the hospital emergency department.   If you develop a fever greater that 101 F, purulent drainage from wound, increased redness or drainage from wound, foul odor from the wound/dressing, or calf pain - CONTACT YOUR SURGEON.                                                   FOLLOW-UP APPOINTMENTS:  If you do not already have a post-op appointment, please call the office for an appointment to be seen by your surgeon.  Guidelines for how soon to be seen are listed in your "After Visit Summary", but are typically between 1-4 weeks after surgery.  OTHER INSTRUCTIONS:   Knee Replacement:  Do not place pillow under knee, focus on keeping the knee straight while resting. CPM instructions: 0-90 degrees, 2 hours in the morning, 2 hours in the afternoon, and 2 hours in the evening. Place foam block, curve side up under heel at all times except when in CPM or when walking.  DO NOT modify, tear, cut, or change the foam block in any way.  POST-OPERATIVE OPIOID TAPER INSTRUCTIONS: It is important to wean off of your opioid medication as soon as possible. If you do not need pain medication after your surgery it is ok to stop day one. Opioids include: Codeine, Hydrocodone(Norco, Vicodin), Oxycodone(Percocet, oxycontin) and hydromorphone amongst others.  Long term and even  short term use of opiods can cause: Increased pain response Dependence Constipation Depression Respiratory depression And more.  Withdrawal symptoms can include Flu like symptoms Nausea, vomiting And more Techniques to manage these symptoms Hydrate well Eat regular healthy meals Stay active Use relaxation techniques(deep breathing, meditating, yoga) Do Not substitute Alcohol to help with tapering If you have been on opioids for less than two  weeks and do not have pain than it is ok to stop all together.  Plan to wean off of opioids This plan should start within one week post op of your joint replacement. Maintain the same interval or time between taking each dose and first decrease the dose.  Cut the total daily intake of opioids by one tablet each day Next start to increase the time between doses. The last dose that should be eliminated is the evening dose.   MAKE SURE YOU:  Understand these instructions.  Get help right away if you are not doing well or get worse.    Thank you for letting us be a part of your medical care team.  It is a privilege we respect greatly.  We hope these instructions will help you stay on track for a fast and full recovery!    Dental Antibiotics:  In most cases prophylactic antibiotics for Dental procdeures after total joint surgery are not necessary.  Exceptions are as follows:  1. History of prior total joint infection  2. Severely immunocompromised (Organ Transplant, cancer chemotherapy, Rheumatoid biologic meds such as Humera)  3. Poorly controlled diabetes (A1C &gt; 8.0, blood glucose over 200)  If you have one of these conditions, contact your surgeon for an antibiotic prescription, prior to your dental procedure.   Increase activity slowly as tolerated   Complete by: As directed    Post-operative opioid taper instructions:   Complete by: As directed    POST-OPERATIVE OPIOID TAPER INSTRUCTIONS: It is important to wean off of your  opioid medication as soon as possible. If you do not need pain medication after your surgery it is ok to stop day one. Opioids include: Codeine, Hydrocodone(Norco, Vicodin), Oxycodone(Percocet, oxycontin) and hydromorphone amongst others.  Long term and even short term use of opiods can cause: Increased pain response Dependence Constipation Depression Respiratory depression And more.  Withdrawal symptoms can include Flu like symptoms Nausea, vomiting And more Techniques to manage these symptoms Hydrate well Eat regular healthy meals Stay active Use relaxation techniques(deep breathing, meditating, yoga) Do Not substitute Alcohol to help with tapering If you have been on opioids for less than two weeks and do not have pain than it is ok to stop all together.  Plan to wean off of opioids This plan should start within one week post op of your joint replacement. Maintain the same interval or time between taking each dose and first decrease the dose.  Cut the total daily intake of opioids by one tablet each day Next start to increase the time between doses. The last dose that should be eliminated is the evening dose.           Follow-up Information     Nelwyn Salisbury, MD Follow up.   Specialty: Family Medicine Contact information: [address] [place] [address] [phone]         [facility] Follow up.   Specialty: Home Health Services Why: home health PT services will be provide by [facility], start of care within 48 hours post discharge Contact information: [address] [place] [address] [phone]                  Signed: Karenann Cai 02/11/2023, 5:05 AM

## 2023-02-17 ENCOUNTER — Other Ambulatory Visit (INDEPENDENT_AMBULATORY_CARE_PROVIDER_SITE_OTHER): Payer: 59

## 2023-02-17 ENCOUNTER — Ambulatory Visit (INDEPENDENT_AMBULATORY_CARE_PROVIDER_SITE_OTHER): Payer: 59 | Admitting: Surgical

## 2023-02-17 DIAGNOSIS — Z96651 Presence of right artificial knee joint: Secondary | ICD-10-CM

## 2023-02-17 NOTE — Progress Notes (Signed)
Post-Op Visit Note   Patient: [name] [name]           Date of Birth: 06-04-1956           MRN: [id number] Visit Date: 02/17/2023 PCP: Nelwyn Salisbury, MD   Assessment & Plan:  Chief Complaint:  Chief Complaint  Patient presents with   Right Knee - Routine Post Op    02/02/2023 Revision Right TKA   Visit Diagnoses:  1. S/P revision of total knee, right     Plan: [name] [name] is a 67 y.o. female who presents s/p right total knee arthroplasty on 02/02/23.  Doing phenomenal overall.  Using CPM.   They deny any calf pain, shortness of breath, chest pain, abdominal pain.  Pain is overall controlled and taking celebrex for pain control.  Taking aspirin for DVT prophylaxis.  Ambulating with cane. No pain really unless using CPM machine. Took [phone] steps the other day. No fevers or chills  On exam, patient has range of motion 5-120.  Incision is healing well without evidence of infection or dehiscence.  2+ DP pulse of the operative extremity.  No calf tenderness, negative Homans' sign.  Able to perform straight leg raise.  Intact ankle dorsiflexion.  Plan is to start physical therapy upstairs.  She will use CPM machine for 1 more week until she gets in with physical therapy.  Will see her back in 4 weeks for clinical recheck with Dr. August Saucer.  Overall she is very satisfied with how she is feeling and she feels much improved compared with how she felt before surgery.    Follow-Up Instructions: No follow-ups on file.   Orders:  Orders Placed This Encounter  Procedures   XR Knee 1-2 Views Right   No orders of the defined types were placed in this encounter.   Imaging: No results found.  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   Primary hypertension 03/09/2022   Osteoarthritis of right knee 08/27/2021    S/P total knee arthroplasty, 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   COVID-19 virus infection 04/09/2021   Dyslipidemia 03/31/2021   Urinary frequency 10/29/2017   Diabetes mellitus without complication (HCC) 09/14/2014   Special screening for malignant neoplasms, colon 11/25/2011   Diverticulosis of colon (without mention of hemorrhage) 11/25/2011   URTICARIA 01/31/2010   ECZEMA 05/03/2009   Past Medical History:  Diagnosis Date   Arthritis    Complication of anesthesia    Diabetes mellitus without complication (HCC)    Gallstones    Hypertension    PONV (postoperative nausea and vomiting)    Ruptured ovarian cyst     Family History  Problem Relation Age of Onset   Cancer Other        breast cancer   Colon cancer Neg Hx    Esophageal cancer Neg Hx    Stomach cancer Neg Hx    Rectal cancer Neg Hx     Past Surgical History:  Procedure Laterality Date   CESAREAN SECTION     COLONOSCOPY  06/05/2019   per Dr. Orvan Falconer, single adenomatous polyp, repeat in 7 yrs   KNEE ARTHROPLASTY Right 08/27/2021   Procedure: COMPUTER ASSISTED TOTAL KNEE ARTHROPLASTY;  Surgeon: Samson Frederic, MD;  Location: [facility] ORS;  Service: Orthopedics;  Laterality: Right;   KNEE CLOSED REDUCTION Right 11/20/2021  Procedure: CLOSED MANIPULATION KNEE;  Surgeon: Samson Frederic, MD;  Location: [facility];  Service: Orthopedics;  Laterality: Right;   lumpectomy,benign, right breast     x2   RIGHT SHOULDER SURGERY      TOTAL KNEE REVISION Right 02/02/2023   Procedure: REVISION RIGHT TOTAL KNEE ARTHROPLASTY;  Surgeon: Cammy Copa, MD;  Location: [facility] OR;  Service: Orthopedics;  Laterality: Right;   Social History   Occupational History   Occupation: food service  Tobacco Use   Smoking status: Never    Passive exposure: Never   Smokeless tobacco: Never  Vaping Use   Vaping Use: Never used  Substance and Sexual Activity   Alcohol use: Yes    Alcohol/week: 0.0 standard drinks of alcohol    Comment: maybe twice a month    Drug use: No   Sexual activity: Not on file

## 2023-02-19 ENCOUNTER — Other Ambulatory Visit: Payer: Self-pay

## 2023-02-19 ENCOUNTER — Encounter: Payer: Self-pay | Admitting: Surgical

## 2023-02-19 DIAGNOSIS — Z96651 Presence of right artificial knee joint: Secondary | ICD-10-CM

## 2023-02-22 ENCOUNTER — Ambulatory Visit: Payer: 59 | Admitting: Physical Therapy

## 2023-02-22 ENCOUNTER — Encounter (HOSPITAL_COMMUNITY): Payer: Self-pay

## 2023-02-22 ENCOUNTER — Other Ambulatory Visit: Payer: Self-pay

## 2023-02-22 ENCOUNTER — Emergency Department (HOSPITAL_COMMUNITY)
Admission: EM | Admit: 2023-02-22 | Discharge: 2023-02-22 | Disposition: A | Discharge status: Home / Self Care | Payer: 59 | Attending: Emergency Medicine | Admitting: Emergency Medicine

## 2023-02-22 DIAGNOSIS — E119 Type 2 diabetes mellitus without complications: Secondary | ICD-10-CM | POA: Diagnosis not present

## 2023-02-22 DIAGNOSIS — K5900 Constipation, unspecified: Secondary | ICD-10-CM | POA: Diagnosis not present

## 2023-02-22 DIAGNOSIS — Z79899 Other long term (current) drug therapy: Secondary | ICD-10-CM | POA: Diagnosis not present

## 2023-02-22 DIAGNOSIS — Z7984 Long term (current) use of oral hypoglycemic drugs: Secondary | ICD-10-CM | POA: Insufficient documentation

## 2023-02-22 DIAGNOSIS — K625 Hemorrhage of anus and rectum: Secondary | ICD-10-CM | POA: Diagnosis present

## 2023-02-22 DIAGNOSIS — Z7982 Long term (current) use of aspirin: Secondary | ICD-10-CM | POA: Diagnosis not present

## 2023-02-22 DIAGNOSIS — I10 Essential (primary) hypertension: Secondary | ICD-10-CM | POA: Diagnosis not present

## 2023-02-22 LAB — CBC
HCT: 33 % — ABNORMAL LOW (ref 36.0–46.0)
Hemoglobin: 10.4 g/dL — ABNORMAL LOW (ref 12.0–15.0)
MCH: 26.9 pg (ref 26.0–34.0)
MCHC: 31.5 g/dL (ref 30.0–36.0)
MCV: 85.5 fL (ref 80.0–100.0)
Platelets: 399 10*3/uL (ref 150–400)
RBC: 3.86 MIL/uL — ABNORMAL LOW (ref 3.87–5.11)
RDW: 14.6 % (ref 11.5–15.5)
WBC: 6.6 10*3/uL (ref 4.0–10.5)
nRBC: 0 % (ref 0.0–0.2)

## 2023-02-22 LAB — COMPREHENSIVE METABOLIC PANEL
ALT: 11 U/L (ref 0–44)
AST: 21 U/L (ref 15–41)
Albumin: 3.3 g/dL — ABNORMAL LOW (ref 3.5–5.0)
Alkaline Phosphatase: 131 U/L — ABNORMAL HIGH (ref 38–126)
Anion gap: 11 (ref 5–15)
BUN: 16 mg/dL (ref 8–23)
CO2: 26 mmol/L (ref 22–32)
Calcium: 8.7 mg/dL — ABNORMAL LOW (ref 8.9–10.3)
Chloride: 101 mmol/L (ref 98–111)
Creatinine, Ser: 0.59 mg/dL (ref 0.44–1.00)
GFR, Estimated: >60 mL/min (ref >60)
Glucose, Bld: 180 mg/dL — ABNORMAL HIGH (ref 70–99)
Potassium: 4 mmol/L (ref 3.5–5.1)
Sodium: 138 mmol/L (ref 135–145)
Total Bilirubin: 0.4 mg/dL (ref 0.3–1.2)
Total Protein: 7.3 g/dL (ref 6.5–8.1)

## 2023-02-22 LAB — TYPE AND SCREEN
ABO/RH(D): O POS
Antibody Screen: NEGATIVE

## 2023-02-22 LAB — POC OCCULT BLOOD, ED: Fecal Occult Bld: POSITIVE — AB

## 2023-02-22 NOTE — ED Provider Notes (Signed)
[place] EMERGENCY DEPARTMENT AT [facility] Provider Note   CSN: [id number] Arrival date & time: 02/22/23  1107     History  No chief complaint on file.   [name] [name] is a 67 y.o. female.  Patient presents to the emergency room complaining of 7 to 8 weeks of intermittently bloody stools.  She denies any abdominal pain, nausea, vomiting, shortness of breath, chest pain.  She states that symptoms began approximately 8 weeks ago and she was given antibiotics by her primary care provider.  Since that time she has had total knee replacement on the right side.  She experienced an episode of constipation where she had no bowel movements for 4 days.  She states that upon passing this bowel movement she noticed the bleeding again.  She says it is "light red".  Past medical history otherwise significant for hypertension, type II DM, diverticulosis of colon  HPI     Home Medications Prior to Admission medications   Medication Sig Start Date End Date Taking? Authorizing Provider  aspirin 81 MG chewable tablet Chew 1 tablet (81 mg total) by mouth 2 (two) times daily. 02/03/23   Magnant, Joycie Peek, PA-C  celecoxib (CELEBREX) 100 MG capsule Take 1 capsule (100 mg total) by mouth 2 (two) times daily. 02/03/23   Magnant, Charles L, PA-C  docusate sodium (COLACE) 100 MG capsule Take 1 capsule (100 mg total) by mouth 2 (two) times daily. 02/03/23   Magnant, Charles L, PA-C  losartan (COZAAR) 50 MG tablet Take 1 tablet (50 mg total) by mouth daily. 04/06/22   Nelwyn Salisbury, MD  metFORMIN (GLUCOPHAGE) 1000 MG tablet Take 0.5 tablets (500 mg total) by mouth 2 (two) times daily with a meal. 01/22/23   Nelwyn Salisbury, MD  methocarbamol (ROBAXIN) 500 MG tablet Take 1 tablet (500 mg total) by mouth every 8 (eight) hours as needed for muscle spasms. 02/03/23   Magnant, Charles L, PA-C  Omega-3 Fatty Acids (FISH OIL PO) Take 1 capsule by mouth daily.    [provider]  oxyCODONE (OXY  IR/ROXICODONE) 5 MG immediate release tablet Take 1 tablet (5 mg total) by mouth every 4 (four) hours as needed for moderate pain (pain score 4-6). 02/03/23   Magnant, Charles L, PA-C  sitaGLIPtin (JANUVIA) 50 MG tablet Take 1 tablet (50 mg total) by mouth daily. 10/26/22   Nelwyn Salisbury, MD      Allergies    Hydrocodone bit-homatrop mbr    Review of Systems   Review of Systems  Physical Exam Updated Vital Signs BP 128/66   Pulse 68   Temp 97.9 F (36.6 C)   Resp 16   SpO2 98%  Physical Exam Vitals and nursing note reviewed. Exam conducted with a chaperone present.  Constitutional:      General: She is not in acute distress.    Appearance: She is well-developed.  HENT:     Head: Normocephalic and atraumatic.  Eyes:     Conjunctiva/sclera: Conjunctivae normal.  Cardiovascular:     Rate and Rhythm: Normal rate and regular rhythm.     Heart sounds: No murmur heard. Pulmonary:     Effort: Pulmonary effort is normal. No respiratory distress.     Breath sounds: Normal breath sounds.  Abdominal:     Palpations: Abdomen is soft.     Tenderness: There is no abdominal tenderness.  Genitourinary:    Rectum: Internal hemorrhoid present. No anal fissure or external hemorrhoid.  Musculoskeletal:  General: No swelling.     Cervical back: Neck supple.  Skin:    General: Skin is warm and dry.     Capillary Refill: Capillary refill takes less than 2 seconds.  Neurological:     Mental Status: She is alert.  Psychiatric:        Mood and Affect: Mood normal.     ED Results / Procedures / Treatments   Labs (all labs ordered are listed, but only abnormal results are displayed) Labs Reviewed  COMPREHENSIVE METABOLIC PANEL - Abnormal; Notable for the following components:      Result Value   Glucose, Bld 180 (*)    Calcium 8.7 (*)    Albumin 3.3 (*)    Alkaline Phosphatase 131 (*)    All other components within normal limits  CBC - Abnormal; Notable for the following  components:   RBC 3.86 (*)    Hemoglobin 10.4 (*)    HCT 33.0 (*)    All other components within normal limits  POC OCCULT BLOOD, ED - Abnormal; Notable for the following components:   Fecal Occult Bld POSITIVE (*)    All other components within normal limits  TYPE AND SCREEN    EKG None  Radiology No results found.  Procedures Procedures    Medications Ordered in ED Medications - No data to display  ED Course/ Medical Decision Making/ A&P                             Medical Decision Making Amount and/or Complexity of Data Reviewed Labs: ordered.   This patient presents to the ED for concern of rectal bleed, this involves an extensive number of treatment options, and is a complaint that carries with it a high risk of complications and morbidity.  The differential diagnosis includes irritated hemorrhoid, anal fissure, upper GI bleed, lower GI bleed, others   Co morbidities that complicate the patient evaluation  Type II DM, diverticulosis   Additional history obtained:   External records from outside source obtained and reviewed including notes from recent hospitalization for revision of the right total knee arthroplasty   Lab Tests:  I Ordered, and personally interpreted labs.  The pertinent results include: Positive fecal occult blood test.  Stable hemoglobin, unremarkable CMP   Test / Admission - Considered:  Patient with stable hemoglobin, encouraging considering recent revision total knee surgery.  No gross blood on exam.  I was able to palpate what felt like an internal hemorrhoid at the 6 o'clock position.  No anal fissure appreciated.  With patient's recent constipation, stable hemoglobin, question of this may be irritation of the internal hemorrhoid.  I do not feel the patient needs emergent evaluation of this bleed at this time.  Plan to discharge home with strict return precautions including shortness of breath, weakness, fatigue.  Patient will  follow-up with GI and primary care for further evaluation and management.  Patient instructed on home bowel regimen and importance of fiber and water to help prevent constipation.         Final Clinical Impression(s) / ED Diagnoses Final diagnoses:  Rectal bleed    Rx / DC Orders ED Discharge Orders     None         Pamala Duffel 02/22/23 1622    Ernie Avena, MD 02/22/23 2027

## 2023-02-22 NOTE — ED Triage Notes (Signed)
Pt came in via POV d/t bloody stools intermittently the last 7-8 weeks, up to 6 BMs daily. Denies abd pain & reports the blood is a small amt of bright red when it happens. Also reports a recent event of 4 days constipation after having a knee surgery, but reports she is going normally again but the blood is still present.

## 2023-02-22 NOTE — Discharge Instructions (Signed)
You were evaluated today for a rectal bleed. There was a small amount of blood on your fecal test. Your other labs are reassuring. Please follow up with your primary care provider and gastroenterology. This may be due to a hemorrhoid due to your recent strained bowel movements. If you become short of breath or develop other life threatening symptoms please return to the emergency department.

## 2023-02-22 NOTE — ED Notes (Signed)
DC instructions reviewed with pt. Pt verbalized understanding.  Pt DC 

## 2023-02-24 ENCOUNTER — Ambulatory Visit (INDEPENDENT_AMBULATORY_CARE_PROVIDER_SITE_OTHER): Payer: 59 | Admitting: Physical Therapy

## 2023-02-24 ENCOUNTER — Other Ambulatory Visit: Payer: Self-pay

## 2023-02-24 ENCOUNTER — Encounter: Payer: Self-pay | Admitting: Physical Therapy

## 2023-02-24 DIAGNOSIS — M25561 Pain in right knee: Secondary | ICD-10-CM

## 2023-02-24 DIAGNOSIS — R600 Localized edema: Secondary | ICD-10-CM

## 2023-02-24 DIAGNOSIS — M25661 Stiffness of right knee, not elsewhere classified: Secondary | ICD-10-CM

## 2023-02-24 DIAGNOSIS — R262 Difficulty in walking, not elsewhere classified: Secondary | ICD-10-CM | POA: Diagnosis not present

## 2023-02-24 NOTE — Therapy (Signed)
OUTPATIENT PHYSICAL THERAPY LOWER EXTREMITY EVALUATION   Patient Name: [name] [name] MRN: [id number] DOB:06/17/1956, 67 y.o., female Today's Date: 02/24/2023  END OF SESSION:  PT End of Session - 02/24/23 1109     Visit Number 1    Number of Visits 20    Date for PT Re-Evaluation 05/07/23    Authorization Type [employer]    PT Start Time 1100    PT Stop Time 1145    PT Time Calculation (min) 45 min    Activity Tolerance Patient tolerated treatment well    Behavior During Therapy WFL for tasks assessed/performed             Past Medical History:  Diagnosis Date   Arthritis    Complication of anesthesia    Diabetes mellitus without complication (HCC)    Gallstones    Hypertension    PONV (postoperative nausea and vomiting)    Ruptured ovarian cyst    Past Surgical History:  Procedure Laterality Date   CESAREAN SECTION     COLONOSCOPY  06/05/2019   per Dr. Orvan Falconer, single adenomatous polyp, repeat in 7 yrs   KNEE ARTHROPLASTY Right 08/27/2021   Procedure: COMPUTER ASSISTED TOTAL KNEE ARTHROPLASTY;  Surgeon: Samson Frederic, MD;  Location: [facility] ORS;  Service: Orthopedics;  Laterality: Right;   KNEE CLOSED REDUCTION Right 11/20/2021   Procedure: CLOSED MANIPULATION KNEE;  Surgeon: Samson Frederic, MD;  Location: [facility] ORS;  Service: Orthopedics;  Laterality: Right;   lumpectomy,benign, right breast     x2   RIGHT SHOULDER SURGERY      TOTAL KNEE REVISION Right 02/02/2023   Procedure: REVISION RIGHT TOTAL KNEE ARTHROPLASTY;  Surgeon: Cammy Copa, MD;  Location: [facility] OR;  Service: Orthopedics;  Laterality: Right;   Patient Active Problem List   Diagnosis Date Noted   Pain due to total right knee replacement (HCC) 02/07/2023   S/P revision of total knee, right 02/02/2023   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   Primary hypertension 03/09/2022   Osteoarthritis of right knee 08/27/2021   S/P total knee arthroplasty, 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   COVID-19 virus infection  04/09/2021   Dyslipidemia 03/31/2021   Urinary frequency 10/29/2017   Diabetes mellitus without complication (HCC) 09/14/2014   Special screening for malignant neoplasms, colon 11/25/2011   Diverticulosis of colon (without mention of hemorrhage) 11/25/2011   URTICARIA 01/31/2010   ECZEMA 05/03/2009    PCP: Nelwyn Salisbury, MD   REFERRING PROVIDER: Julieanne Cotton, PA-C   REFERRING DIAG: [phone] (ICD-10-CM) - S/P revision of total knee, right  THERAPY DIAG:  Stiffness of right knee, not elsewhere classified  Difficulty in walking, not elsewhere classified  Acute pain of right knee  Localized edema  Rationale for Evaluation and Treatment: Rehabilitation  ONSET DATE: 02/02/23  SUBJECTIVE:   SUBJECTIVE STATEMENT: Patient is 67 y.o. female s/p Rt TKA revision on 02/02/23. Pt stating she was allergic to the origianl prosthesis placed on 08/27/2021.   PERTINENT HISTORY: OA, DM, HTN, initial Rt TKA 08/27/2021, closed reduction 11/20/21, Rt shoulder surgery    PAIN:  NPRS scale: 3/10, but can get higher at times Pain location: Rt knee pain Pain description: achy Aggravating factors: being on my feet, walking Relieving factors: resting, ice, elevating  PRECAUTIONS: None  WEIGHT BEARING RESTRICTIONS: No  FALLS:  Has patient fallen in last 6 months? No  LIVING ENVIRONMENT: Lives with: lives with their family and lives with their spouse Lives in: House/apartment Stairs: Yes: External: 5 steps; can reach  both Has following equipment at home: st cane  OCCUPATION: works at [employer]  PLOF: Independent  PATIENT GOALS: get back to work  Next MD visit: Dr. August Saucer 03/17/23   OBJECTIVE:   DIAGNOSTIC FINDINGS: Result Narrative  AP, lateral views of right knee reviewed.  Right total knee revision implants in good position and alignment without any complicating features.  There is no evidence of prosthetic fracture, dislocation, loosening.  PATIENT SURVEYS:  02/24/23: FOTO  intake:  32  COGNITION: Overall cognitive status: WFL    SENSATION: WFL  EDEMA:        02/24/23: Rt Circumferential: 37.25 centimeters               : Left Circumferential: 34.75 centimeters  MUSCLE LENGTH: 02/24/23: Hamstrings: Right 75 deg; Left 78 deg   LOWER EXTREMITY ROM:   ROM 02/24/23 Rt  02/24/23 Left  Hip flexion    Hip extension    Hip abduction    Hip adduction    Hip internal rotation    Hip external rotation    Knee flexion A: 88 P: 95 A: 130  Knee extension A: 10 P: -6 A: 0  Ankle dorsiflexion    Ankle plantarflexion    Ankle inversion    Ankle eversion     (Blank rows = not tested)  LOWER EXTREMITY MMT:  MMT Right 02/24/23 Left 02/24/23  Hip flexion 4 4  Hip extension    Hip abduction 5 5  Hip adduction 5 5  Hip internal rotation    Hip external rotation    Knee flexion 3 5  Knee extension 3 5  Ankle dorsiflexion    Ankle plantarflexion    Ankle inversion    Ankle eversion     (Blank rows = not tested)    FUNCTIONAL TESTS:   02/24/23: 5 time sit to stand: 15 seconds UE support  GAIT: Distance walked: 30 feet level surface Assistive device utilized: Single point cane Level of assistance: Modified independence Comments: mild antalgic gait with increased knee flexion and hip flexion on the Rt.                                                                                                                                                                         TODAY'S TREATMENT                                                                        DATE: 02/24/23 Therex:  HEP instruction/performance c cues for techniques, handout provided.  Trial set performed of each for comprehension and symptom assessment.  See below for exercise list  PATIENT EDUCATION:  Education details: HEP, POC Person educated: Patient Education method: Explanation, Demonstration, Verbal cues, and Handouts Education comprehension: verbalized understanding, returned  demonstration, and verbal cues required  HOME EXERCISE PROGRAM: Access Code: [email or web address] URL: [email or web address] Date: 02/24/2023 Prepared by: Narda Amber  Exercises - Supine Quad Set on Towel Roll  - 3 x daily - 7 x weekly - 10-15 reps - 5 seconds hold - Supine Heel Slide with Strap  - 3 x daily - 7 x weekly - 10-15 reps - 5 seconds hold - Hooklying Straight Leg Raise  - 3 x daily - 7 x weekly - 10 reps - Seated Knee Flexion AAROM  - 3 x daily - 7 x weekly - 10 reps - 10 seconds hold - Sit to Stand with Counter Support  - 3 x daily - 7 x weekly - 10 reps  ASSESSMENT:  CLINICAL IMPRESSION: Patient is a 67 y.o. who comes to clinic with complaints of Rt knee pain s/p Rt TKA revision on 02/02/23. Pt presents with mobility, strength and movement coordination deficits that impair their ability to perform usual daily and recreational functional activities without increase difficulty/symptoms at this time.  Patient to benefit from skilled PT services to address impairments and limitations to improve to previous level of function without restriction secondary to condition.   OBJECTIVE IMPAIRMENTS: decreased balance, decreased mobility, difficulty walking, decreased ROM, decreased strength, increased edema, impaired flexibility, and pain.   ACTIVITY LIMITATIONS: bending, sitting, standing, squatting, sleeping, stairs, transfers, and bed mobility  PARTICIPATION LIMITATIONS: cleaning, laundry, driving, and community activity  PERSONAL FACTORS: 3+ comorbidities: see pertinent history above  are also affecting patient's functional outcome.   REHAB POTENTIAL: Good  CLINICAL DECISION MAKING: Stable/uncomplicated  EVALUATION COMPLEXITY: Low   GOALS: Goals reviewed with patient? Yes  SHORT TERM GOALS: (target date for Short term goals are 3 weeks 03/19/23)   1.  Patient will demonstrate independent use of home exercise program to maintain progress from in clinic  treatments.  Goal status: New  LONG TERM GOALS: (target dates for all long term goals are 10 weeks  05/07/23 )   1. Patient will demonstrate/report pain at worst less than or equal to 2/10 to facilitate minimal limitation in daily activity secondary to pain symptoms.  Goal status: New   2. Patient will demonstrate independent use of home exercise program to facilitate ability to maintain/progress functional gains from skilled physical therapy services.  Goal status: New   3. Patient will demonstrate FOTO outcome > or = 55 % to indicate reduced disability due to condition.  Goal status: New   4.  Patient will demonstrate Rt LE MMT 5/5 throughout to faciltiate usual transfers, stairs, squatting at [facility] for daily life.   Goal status: New   5.  Patient will demonstrate up and down curb step with LRAD safely for community navigation.  Goal status: New   6.  Pt will be able to donn her socks independently c Rt knee flexion.  Goal status: New  7. Pt will be able to lift 15 # from floor to counter height safely with pain </= 2/10 in her Rt knee.   Goal Status: New  PLAN:  PT FREQUENCY: 2-3x/week  PT DURATION: 10 weeks  PLANNED INTERVENTIONS: Therapeutic exercises, Therapeutic activity, Neuro Muscular re-education, Balance training, Gait training, Patient/Family education, Joint mobilization,  Stair training, DME instructions, Dry Needling, Electrical stimulation, Traction, Cryotherapy, vasopneumatic deviceMoist heat, Taping, Ultrasound, Ionotophoresis 4mg /ml Dexamethasone, and aquatic therapy, Manual therapy.  All included unless contraindicated  PLAN FOR NEXT SESSION: Review HEP knowledge/results, ROM, LE strengthening Vasopneumatic   Next MD visit: Dr. August Saucer 03/17/23     Sharmon Leyden, PT, MPT 02/24/2023, 11:10 AM

## 2023-03-02 ENCOUNTER — Ambulatory Visit (INDEPENDENT_AMBULATORY_CARE_PROVIDER_SITE_OTHER): Payer: 59 | Admitting: Rehabilitative and Restorative Service Providers"

## 2023-03-02 ENCOUNTER — Other Ambulatory Visit: Payer: Self-pay | Admitting: Surgical

## 2023-03-02 ENCOUNTER — Encounter: Payer: Self-pay | Admitting: Rehabilitative and Restorative Service Providers"

## 2023-03-02 DIAGNOSIS — R600 Localized edema: Secondary | ICD-10-CM

## 2023-03-02 DIAGNOSIS — R262 Difficulty in walking, not elsewhere classified: Secondary | ICD-10-CM

## 2023-03-02 DIAGNOSIS — M25661 Stiffness of right knee, not elsewhere classified: Secondary | ICD-10-CM

## 2023-03-02 DIAGNOSIS — M25561 Pain in right knee: Secondary | ICD-10-CM

## 2023-03-02 NOTE — Therapy (Addendum)
OUTPATIENT PHYSICAL THERAPY TREATMENT   Patient Name: [name] [name] MRN: [id number] DOB:May 10, 1956, 67 y.o., female Today's Date: 03/02/2023  END OF SESSION:  PT End of Session - 03/02/23 0830     Visit Number 2    Number of Visits 30    Date for PT Re-Evaluation 05/07/23    Authorization Type [employer]    PT Start Time [phone]    PT Stop Time 0921    PT Time Calculation (min) 38 min    Activity Tolerance Patient tolerated treatment well    Behavior During Therapy WFL for tasks assessed/performed              Past Medical History:  Diagnosis Date   Arthritis    Complication of anesthesia    Diabetes mellitus without complication (HCC)    Gallstones    Hypertension    PONV (postoperative nausea and vomiting)    Ruptured ovarian cyst    Past Surgical History:  Procedure Laterality Date   CESAREAN SECTION     COLONOSCOPY  06/05/2019   per Dr. Orvan Falconer, single adenomatous polyp, repeat in 7 yrs   KNEE ARTHROPLASTY Right 08/27/2021   Procedure: COMPUTER ASSISTED TOTAL KNEE ARTHROPLASTY;  Surgeon: Samson Frederic, MD;  Location: [facility];  Service: Orthopedics;  Laterality: Right;   KNEE CLOSED REDUCTION Right 11/20/2021   Procedure: CLOSED MANIPULATION KNEE;  Surgeon: Samson Frederic, MD;  Location: [facility];  Service: Orthopedics;  Laterality: Right;   lumpectomy,benign, right breast     x2   RIGHT SHOULDER SURGERY      TOTAL KNEE REVISION Right 02/02/2023   Procedure: REVISION RIGHT TOTAL KNEE ARTHROPLASTY;  Surgeon: Cammy Copa, MD;  Location: [facility];  Service: Orthopedics;  Laterality: Right;   Patient Active Problem List   Diagnosis Date Noted   Pain due to total right knee replacement (HCC) 02/07/2023   S/P revision of total knee, right 02/02/2023   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   Primary hypertension 03/09/2022   Osteoarthritis of right knee 08/27/2021   S/P total knee arthroplasty, 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   COVID-19 virus infection 04/09/2021    Dyslipidemia 03/31/2021   Urinary frequency 10/29/2017   Diabetes mellitus without complication (HCC) 09/14/2014   Special screening for malignant neoplasms, colon 11/25/2011   Diverticulosis of colon (without mention of hemorrhage) 11/25/2011   URTICARIA 01/31/2010   ECZEMA 05/03/2009    PCP: Nelwyn Salisbury, MD   REFERRING PROVIDER: Julieanne Cotton, PA-C   REFERRING DIAG: [phone] (ICD-10-CM) - S/P revision of total knee, right  THERAPY DIAG:  Stiffness of right knee, not elsewhere classified  Difficulty in walking, not elsewhere classified  Acute pain of right knee  Localized edema  Rationale for Evaluation and Treatment: Rehabilitation  ONSET DATE: 02/02/23  SUBJECTIVE:   SUBJECTIVE STATEMENT: She indicated no having pain today upon arrival.  Reported feeling numb around knee.  Same as noted since revision  PERTINENT HISTORY: OA, DM, HTN, initial Rt TKA 08/27/2021, closed reduction 11/20/21, Rt shoulder surgery    PAIN:  NPRS scale: no pain upon arrival.  Pain location: Rt knee pain Pain description: achy Aggravating factors: being on my feet, walking Relieving factors: resting, ice, elevating  PRECAUTIONS: None  WEIGHT BEARING RESTRICTIONS: No  FALLS:  Has patient fallen in last 6 months? No  LIVING ENVIRONMENT: Lives with: lives with their family and lives with their spouse Lives in: House/apartment Stairs: Yes: External: 5 steps; can reach both Has following equipment at home: st  cane  OCCUPATION: works at [employer]  PLOF: Independent  PATIENT GOALS: get back to work  Next MD visit: Dr. August Saucer 03/17/23   OBJECTIVE:   DIAGNOSTIC FINDINGS: Result Narrative  AP, lateral views of right knee reviewed.  Right total knee revision implants in good position and alignment without any complicating features.  There is no evidence of prosthetic fracture, dislocation, loosening.  PATIENT SURVEYS:  02/24/23: FOTO intake:  32  COGNITION: 02/24/2023 Overall  cognitive status: WFL    SENSATION: 02/24/2023 WFL  EDEMA:   02/24/23: Rt Circumferential: 37.25 centimeters               : Left Circumferential: 34.75 centimeters  MUSCLE LENGTH: 02/24/23: Hamstrings: Right 75 deg; Left 78 deg   LOWER EXTREMITY ROM:   ROM 02/24/23 Rt  02/24/23 Left  Hip flexion    Hip extension    Hip abduction    Hip adduction    Hip internal rotation    Hip external rotation    Knee flexion A: 88 P: 95 A: 130  Knee extension A: 10 P: -6 A: 0  Ankle dorsiflexion    Ankle plantarflexion    Ankle inversion    Ankle eversion     (Blank rows = not tested)  LOWER EXTREMITY MMT:  MMT Right 02/24/23 Left 02/24/23  Hip flexion 4 4  Hip extension    Hip abduction 5 5  Hip adduction 5 5  Hip internal rotation    Hip external rotation    Knee flexion 3 5  Knee extension 3 5  Ankle dorsiflexion    Ankle plantarflexion    Ankle inversion    Ankle eversion     (Blank rows = not tested)  FUNCTIONAL TESTS:   02/24/23: 5 time sit to stand: 15 seconds UE support  GAIT: 02/24/2023 Distance walked: 30 feet level surface Assistive device utilized: Single point cane Level of assistance: Modified independence Comments: mild antalgic gait with increased knee flexion and hip flexion on the Rt.                                                                                                                                                                         TODAY'S TREATMENT                                                                        DATE:03/02/2023 Therex: Nustep Lvl 6 8 mins UE/LE for ROM Incline gastroc stretch 30  sec x 3 bilaterally Step on over and down 4 inch step WB on Rt leg x 15 with single hand assist on bar Leg press double leg 81 lbs x 15, single leg Rt 37 lbs 2 x 10  Seated SLR Rt 2 x 10    Neuro Re-ed Tandem stance 1 min x 1 bilateral with occasional HHA on bars Tandem ambulation fwd/back in // bars with occasional HHA 10 ft x 6  each way Standing ankle fwd/back weight shifting church pew exercise 2 mins c SBA - on foam Standing slow marching on foam x 10 bilateral with SBA   TODAY'S TREATMENT                                                                        DATE:02/24/23 Therex: HEP instruction/performance c cues for techniques, handout provided.  Trial set performed of each for comprehension and symptom assessment.  See below for exercise list  PATIENT EDUCATION:  02/24/2023 Education details: HEP, POC Person educated: Patient Education method: Explanation, Demonstration, Verbal cues, and Handouts Education comprehension: verbalized understanding, returned demonstration, and verbal cues required  HOME EXERCISE PROGRAM: Access Code: [email or web address] URL: [email or web address] Date: 02/24/2023 Prepared by: Narda Amber  Exercises - Supine Quad Set on Towel Roll  - 3 x daily - 7 x weekly - 10-15 reps - 5 seconds hold - Supine Heel Slide with Strap  - 3 x daily - 7 x weekly - 10-15 reps - 5 seconds hold - Hooklying Straight Leg Raise  - 3 x daily - 7 x weekly - 10 reps - Seated Knee Flexion AAROM  - 3 x daily - 7 x weekly - 10 reps - 10 seconds hold - Sit to Stand with Counter Support  - 3 x daily - 7 x weekly - 10 reps  ASSESSMENT:  CLINICAL IMPRESSION: Periods of safe ambulation without cane noted in clinic today.  Improved tolerance to overall mobility.  Focused today on strength and balance today with good overall performance.  Doing well at this time.   OBJECTIVE IMPAIRMENTS: decreased balance, decreased mobility, difficulty walking, decreased ROM, decreased strength, increased edema, impaired flexibility, and pain.   ACTIVITY LIMITATIONS: bending, sitting, standing, squatting, sleeping, stairs, transfers, and bed mobility  PARTICIPATION LIMITATIONS: cleaning, laundry, driving, and community activity  PERSONAL FACTORS: 3+ comorbidities: see pertinent history above  are also affecting  patient's functional outcome.   REHAB POTENTIAL: Good  CLINICAL DECISION MAKING: Stable/uncomplicated  EVALUATION COMPLEXITY: Low   GOALS: Goals reviewed with patient? Yes  SHORT TERM GOALS: (target date for Short term goals are 3 weeks 03/19/23)   1.  Patient will demonstrate independent use of home exercise program to maintain progress from in clinic treatments.  Goal status: on going 03/02/2023  LONG TERM GOALS: (target dates for all long term goals are 10 weeks  05/07/23 )   1. Patient will demonstrate/report pain at worst less than or equal to 2/10 to facilitate minimal limitation in daily activity secondary to pain symptoms.  Goal status: New   2. Patient will demonstrate independent use of home exercise program to facilitate ability to maintain/progress functional gains from skilled physical therapy services.  Goal status: New   3. Patient will demonstrate FOTO outcome >  or = 55 % to indicate reduced disability due to condition.  Goal status: New   4.  Patient will demonstrate Rt LE MMT 5/5 throughout to faciltiate usual transfers, stairs, squatting at [facility] for daily life.   Goal status: New   5.  Patient will demonstrate up and down curb step with LRAD safely for community navigation.  Goal status: New   6.  Pt will be able to donn her socks independently c Rt knee flexion.  Goal status: New  7. Pt will be able to lift 15 # from floor to counter height safely with pain </= 2/10 in her Rt knee.   Goal Status: New  PLAN:  PT FREQUENCY: 2-3x/week  PT DURATION: 10 weeks  PLANNED INTERVENTIONS: Therapeutic exercises, Therapeutic activity, Neuro Muscular re-education, Balance training, Gait training, Patient/Family education, Joint mobilization, Stair training, DME instructions, Dry Needling, Electrical stimulation, Traction, Cryotherapy, vasopneumatic deviceMoist heat, Taping, Ultrasound, Ionotophoresis 4mg /ml Dexamethasone, and aquatic therapy, Manual therapy.  All  included unless contraindicated  PLAN FOR NEXT SESSION: Recheck ROM.  Progressive WB strengthening.    Next MD visit: Dr. August Saucer 03/17/23   Chyrel Masson, PT, DPT, OCS, ATC 03/02/23  9:20 AM

## 2023-03-04 ENCOUNTER — Ambulatory Visit (INDEPENDENT_AMBULATORY_CARE_PROVIDER_SITE_OTHER): Payer: 59 | Admitting: Physical Therapy

## 2023-03-04 ENCOUNTER — Encounter: Payer: Self-pay | Admitting: Physical Therapy

## 2023-03-04 DIAGNOSIS — M25661 Stiffness of right knee, not elsewhere classified: Secondary | ICD-10-CM | POA: Diagnosis not present

## 2023-03-04 DIAGNOSIS — M25561 Pain in right knee: Secondary | ICD-10-CM

## 2023-03-04 DIAGNOSIS — R600 Localized edema: Secondary | ICD-10-CM

## 2023-03-04 DIAGNOSIS — R262 Difficulty in walking, not elsewhere classified: Secondary | ICD-10-CM

## 2023-03-04 NOTE — Therapy (Signed)
OUTPATIENT PHYSICAL THERAPY TREATMENT   Patient Name: [name] [name] MRN: [id number] DOB:[birth year], 67 y.o., female Today's Date: 03/04/2023  END OF SESSION:  PT End of Session - 03/04/23 0842     Visit Number 3    Number of Visits 30    Date for PT Re-Evaluation 05/07/23    Authorization Type [employer]    PT Start Time 0840    PT Stop Time 0922    PT Time Calculation (min) 42 min    Activity Tolerance Patient tolerated treatment well    Behavior During Therapy WFL for tasks assessed/performed               Past Medical History:  Diagnosis Date   Arthritis    Complication of anesthesia    Diabetes mellitus without complication (HCC)    Gallstones    Hypertension    PONV (postoperative nausea and vomiting)    Ruptured ovarian cyst    Past Surgical History:  Procedure Laterality Date   CESAREAN SECTION     COLONOSCOPY  06/05/2019   per Dr. Orvan Falconer, single adenomatous polyp, repeat in 7 yrs   KNEE ARTHROPLASTY Right 08/27/2021   Procedure: COMPUTER ASSISTED TOTAL KNEE ARTHROPLASTY;  Surgeon: Samson Frederic, MD;  Location: [facility];  Service: Orthopedics;  Laterality: Right;   KNEE CLOSED REDUCTION Right 11/20/2021   Procedure: CLOSED MANIPULATION KNEE;  Surgeon: Samson Frederic, MD;  Location: [facility];  Service: Orthopedics;  Laterality: Right;   lumpectomy,benign, right breast     x2   RIGHT SHOULDER SURGERY      TOTAL KNEE REVISION Right 02/02/2023   Procedure: REVISION RIGHT TOTAL KNEE ARTHROPLASTY;  Surgeon: Cammy Copa, MD;  Location: [facility] OR;  Service: Orthopedics;  Laterality: Right;   Patient Active Problem List   Diagnosis Date Noted   Pain due to total right knee replacement (HCC) 02/07/2023   S/P revision of total knee, right 02/02/2023   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   Primary hypertension 03/09/2022   Osteoarthritis of right knee 08/27/2021   S/P total knee arthroplasty, 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   COVID-19 virus infection 04/09/2021    Dyslipidemia 03/31/2021   Urinary frequency 10/29/2017   Diabetes mellitus without complication (HCC) 09/14/2014   Special screening for malignant neoplasms, colon 11/25/2011   Diverticulosis of colon (without mention of hemorrhage) 11/25/2011   URTICARIA 01/31/2010   ECZEMA 05/03/2009    PCP: Nelwyn Salisbury, MD   REFERRING PROVIDER: Julieanne Cotton, PA-C   REFERRING DIAG: [phone] (ICD-10-CM) - S/P revision of total knee, right  THERAPY DIAG:  Stiffness of right knee, not elsewhere classified  Difficulty in walking, not elsewhere classified  Acute pain of right knee  Localized edema  Rationale for Evaluation and Treatment: Rehabilitation  ONSET DATE: 02/02/23  SUBJECTIVE:   SUBJECTIVE STATEMENT: Doing well; no pain in knee.  Mainly carrying cane at this time  PERTINENT HISTORY: OA, DM, HTN, initial Rt TKA 08/27/2021, closed reduction 11/20/21, Rt shoulder surgery    PAIN:  NPRS scale: no pain upon arrival.  Pain location: Rt knee pain Pain description: achy Aggravating factors: being on my feet, walking Relieving factors: resting, ice, elevating  PRECAUTIONS: None  WEIGHT BEARING RESTRICTIONS: No  FALLS:  Has patient fallen in last 6 months? No  LIVING ENVIRONMENT: Lives with: lives with their family and lives with their spouse Lives in: House/apartment Stairs: Yes: External: 5 steps; can reach both Has following equipment at home: st cane  OCCUPATION: works at [employer]  PLOF: Independent  PATIENT GOALS: get back to work  Next MD visit: Dr. August Saucer 03/17/23   OBJECTIVE:   DIAGNOSTIC FINDINGS: Result Narrative  AP, lateral views of right knee reviewed.  Right total knee revision implants in good position and alignment without any complicating features.  There is no evidence of prosthetic fracture, dislocation, loosening.  PATIENT SURVEYS:  02/24/23: FOTO intake:  32  COGNITION: 02/24/2023 Overall cognitive status:  WFL    SENSATION: 02/24/2023 WFL  EDEMA:   02/24/23: Rt Circumferential: 37.25 centimeters               : Left Circumferential: 34.75 centimeters  MUSCLE LENGTH: 02/24/23: Hamstrings: Right 75 deg; Left 78 deg   LOWER EXTREMITY ROM:   ROM 02/24/23 Rt  02/24/23 Left Right 03/04/23  Knee flexion A: 88 P: 95 A: 130 A: 105 AA: 108  Knee extension A: 10 P: -6 A: 0 A: -2 (LAQ) P: 0   (Blank rows = not tested)  LOWER EXTREMITY MMT:  MMT Right 02/24/23 Left 02/24/23  Hip flexion 4 4  Hip extension    Hip abduction 5 5  Hip adduction 5 5  Hip internal rotation    Hip external rotation    Knee flexion 3 5  Knee extension 3 5  Ankle dorsiflexion    Ankle plantarflexion    Ankle inversion    Ankle eversion     (Blank rows = not tested)  FUNCTIONAL TESTS:   02/24/23: 5 time sit to stand: 15 seconds UE support  GAIT: 02/24/2023 Distance walked: 30 feet level surface Assistive device utilized: Single point cane Level of assistance: Modified independence Comments: mild antalgic gait with increased knee flexion and hip flexion on the Rt.                                                                                                                                                                          TODAY'S TREATMENT                                                                        DATE:03/04/2023 Therex: Nustep Lvl 6 8 mins UE/LE for ROM Leg press bil 87 lbs 3x10,  Rt 43 lbs  3x10 Forward step ups with RLE onto 4" and 6" step x 10 reps each  RLE on 4" step with Lt heel taps 2x10; light UE support Calf raises x 20 reps Incline gastroc stretch 30 sec x 3  bilaterally LAQ x 10 reps on Rt AA heel slides x 10 reps ROM measurements - see above for details   Neuro Re-ed Sidestepping on foam beam x 5 laps in // bars without UE support Tandem walking in // bars on foam beam x 5 laps; intermittent UE support    TODAY'S TREATMENT                                                                         DATE:03/02/2023 Therex: Nustep Lvl 6 8 mins UE/LE for ROM Incline gastroc stretch 30 sec x 3 bilaterally Step on over and down 4 inch step WB on Rt leg x 15 with single hand assist on bar Leg press double leg 81 lbs x 15, single leg Rt 37 lbs 2 x 10  Seated SLR Rt 2 x 10    Neuro Re-ed Tandem stance 1 min x 1 bilateral with occasional HHA on bars Tandem ambulation fwd/back in // bars with occasional HHA 10 ft x 6 each way Standing ankle fwd/back weight shifting church pew exercise 2 mins c SBA - on foam Standing slow marching on foam x 10 bilateral with SBA   TODAY'S TREATMENT                                                                        DATE:02/24/23 Therex: HEP instruction/performance c cues for techniques, handout provided.  Trial set performed of each for comprehension and symptom assessment.  See below for exercise list  PATIENT EDUCATION:  02/24/2023 Education details: HEP, POC Person educated: Patient Education method: Explanation, Demonstration, Verbal cues, and Handouts Education comprehension: verbalized understanding, returned demonstration, and verbal cues required  HOME EXERCISE PROGRAM: Access Code: [email or web address] URL: [email or web address] Date: 02/24/2023 Prepared by: Narda Amber  Exercises - Supine Quad Set on Towel Roll  - 3 x daily - 7 x weekly - 10-15 reps - 5 seconds hold - Supine Heel Slide with Strap  - 3 x daily - 7 x weekly - 10-15 reps - 5 seconds hold - Hooklying Straight Leg Raise  - 3 x daily - 7 x weekly - 10 reps - Seated Knee Flexion AAROM  - 3 x daily - 7 x weekly - 10 reps - 10 seconds hold - Sit to Stand with Counter Support  - 3 x daily - 7 x weekly - 10 reps  ASSESSMENT:  CLINICAL IMPRESSION: Pt with excellent improvement in ROM today and overall progressing well with PT.  Feel she is safe for limited community ambulation without AD at this time.  Will continue to benefit from PT to  maximize function.  OBJECTIVE IMPAIRMENTS: decreased balance, decreased mobility, difficulty walking, decreased ROM, decreased strength, increased edema, impaired flexibility, and pain.   ACTIVITY LIMITATIONS: bending, sitting, standing, squatting, sleeping, stairs, transfers, and bed mobility  PARTICIPATION LIMITATIONS: cleaning, laundry, driving, and community activity  PERSONAL FACTORS: 3+ comorbidities: see pertinent history above  are also affecting patient's functional outcome.  REHAB POTENTIAL: Good  CLINICAL DECISION MAKING: Stable/uncomplicated  EVALUATION COMPLEXITY: Low   GOALS: Goals reviewed with patient? Yes  SHORT TERM GOALS: (target date for Short term goals are 3 weeks 03/19/23)   1.  Patient will demonstrate independent use of home exercise program to maintain progress from in clinic treatments.  Goal status: on going 03/02/2023  LONG TERM GOALS: (target dates for all long term goals are 10 weeks  05/07/23 )   1. Patient will demonstrate/report pain at worst less than or equal to 2/10 to facilitate minimal limitation in daily activity secondary to pain symptoms.  Goal status: New   2. Patient will demonstrate independent use of home exercise program to facilitate ability to maintain/progress functional gains from skilled physical therapy services.  Goal status: New   3. Patient will demonstrate FOTO outcome > or = 55 % to indicate reduced disability due to condition.  Goal status: New   4.  Patient will demonstrate Rt LE MMT 5/5 throughout to faciltiate usual transfers, stairs, squatting at [facility] for daily life.   Goal status: New   5.  Patient will demonstrate up and down curb step with LRAD safely for community navigation.  Goal status: New   6.  Pt will be able to donn her socks independently c Rt knee flexion.  Goal status: New  7. Pt will be able to lift 15 # from floor to counter height safely with pain </= 2/10 in her Rt knee.   Goal Status:  New  PLAN:  PT FREQUENCY: 2-3x/week  PT DURATION: 10 weeks  PLANNED INTERVENTIONS: Therapeutic exercises, Therapeutic activity, Neuro Muscular re-education, Balance training, Gait training, Patient/Family education, Joint mobilization, Stair training, DME instructions, Dry Needling, Electrical stimulation, Traction, Cryotherapy, vasopneumatic deviceMoist heat, Taping, Ultrasound, Ionotophoresis 4mg /ml Dexamethasone, and aquatic therapy, Manual therapy.  All included unless contraindicated  PLAN FOR NEXT SESSION: Progressive WB strengthening, balance    Next MD visit: Dr. August Saucer 03/17/23   Clarita Crane, PT, DPT 03/04/23 9:24 AM

## 2023-03-09 ENCOUNTER — Encounter: Payer: Self-pay | Admitting: Physical Therapy

## 2023-03-09 ENCOUNTER — Ambulatory Visit: Payer: 59 | Admitting: Physical Therapy

## 2023-03-09 DIAGNOSIS — R262 Difficulty in walking, not elsewhere classified: Secondary | ICD-10-CM | POA: Diagnosis not present

## 2023-03-09 DIAGNOSIS — R600 Localized edema: Secondary | ICD-10-CM | POA: Diagnosis not present

## 2023-03-09 DIAGNOSIS — M25561 Pain in right knee: Secondary | ICD-10-CM | POA: Diagnosis not present

## 2023-03-09 DIAGNOSIS — M25661 Stiffness of right knee, not elsewhere classified: Secondary | ICD-10-CM

## 2023-03-09 NOTE — Therapy (Signed)
OUTPATIENT PHYSICAL THERAPY TREATMENT   Patient Name: [name] [name] MRN: [id number] DOB:[birth year], 67 y.o., female Today's Date: 03/09/2023  END OF SESSION:  PT End of Session - 03/09/23 0758     Visit Number 4    Number of Visits 30    Date for PT Re-Evaluation 05/07/23    Authorization Type [employer]    PT Start Time 0801    PT Stop Time 0839    PT Time Calculation (min) 38 min    Activity Tolerance Patient tolerated treatment well    Behavior During Therapy WFL for tasks assessed/performed                Past Medical History:  Diagnosis Date   Arthritis    Complication of anesthesia    Diabetes mellitus without complication (HCC)    Gallstones    Hypertension    PONV (postoperative nausea and vomiting)    Ruptured ovarian cyst    Past Surgical History:  Procedure Laterality Date   CESAREAN SECTION     COLONOSCOPY  06/05/2019   per Dr. Orvan Falconer, single adenomatous polyp, repeat in 7 yrs   KNEE ARTHROPLASTY Right 08/27/2021   Procedure: COMPUTER ASSISTED TOTAL KNEE ARTHROPLASTY;  Surgeon: Samson Frederic, MD;  Location: [facility];  Service: Orthopedics;  Laterality: Right;   KNEE CLOSED REDUCTION Right 11/20/2021   Procedure: CLOSED MANIPULATION KNEE;  Surgeon: Samson Frederic, MD;  Location: [facility];  Service: Orthopedics;  Laterality: Right;   lumpectomy,benign, right breast     x2   RIGHT SHOULDER SURGERY      TOTAL KNEE REVISION Right 02/02/2023   Procedure: REVISION RIGHT TOTAL KNEE ARTHROPLASTY;  Surgeon: Cammy Copa, MD;  Location: [facility] OR;  Service: Orthopedics;  Laterality: Right;   Patient Active Problem List   Diagnosis Date Noted   Pain due to total right knee replacement (HCC) 02/07/2023   S/P revision of total knee, right 02/02/2023   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   Primary hypertension 03/09/2022   Osteoarthritis of right knee 08/27/2021   S/P total knee arthroplasty, 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   COVID-19 virus infection 04/09/2021    Dyslipidemia 03/31/2021   Urinary frequency 10/29/2017   Diabetes mellitus without complication (HCC) 09/14/2014   Special screening for malignant neoplasms, colon 11/25/2011   Diverticulosis of colon (without mention of hemorrhage) 11/25/2011   URTICARIA 01/31/2010   ECZEMA 05/03/2009    PCP: Nelwyn Salisbury, MD   REFERRING PROVIDER: Julieanne Cotton, PA-C   REFERRING DIAG: [phone] (ICD-10-CM) - S/P revision of total knee, right  THERAPY DIAG:  Stiffness of right knee, not elsewhere classified  Difficulty in walking, not elsewhere classified  Acute pain of right knee  Localized edema  Rationale for Evaluation and Treatment: Rehabilitation  ONSET DATE: 02/02/23  SUBJECTIVE:   SUBJECTIVE STATEMENT: Pt arriving reporting pain with walking 3/10.   PERTINENT HISTORY: OA, DM, HTN, initial Rt TKA 08/27/2021, closed reduction 11/20/21, Rt shoulder surgery    PAIN:  NPRS scale: 3/10 with wt bearing activities Pain location: Rt knee pain Pain description: achy Aggravating factors: being on my feet, walking Relieving factors: resting, ice, elevating  PRECAUTIONS: None  WEIGHT BEARING RESTRICTIONS: No  FALLS:  Has patient fallen in last 6 months? No  LIVING ENVIRONMENT: Lives with: lives with their family and lives with their spouse Lives in: House/apartment Stairs: Yes: External: 5 steps; can reach both Has following equipment at home: st cane  OCCUPATION: works at [employer]  PLOF: Independent  PATIENT GOALS: get back to work  Next MD visit: Dr. August Saucer 03/17/23   OBJECTIVE:   DIAGNOSTIC FINDINGS: Result Narrative  AP, lateral views of right knee reviewed.  Right total knee revision implants in good position and alignment without any complicating features.  There is no evidence of prosthetic fracture, dislocation, loosening.  PATIENT SURVEYS:  02/24/23: FOTO intake:  32  COGNITION: 02/24/2023 Overall cognitive status:  WFL    SENSATION: 02/24/2023 WFL  EDEMA:   02/24/23: Rt Circumferential: 37.25 centimeters               : Left Circumferential: 34.75 centimeters  MUSCLE LENGTH: 02/24/23: Hamstrings: Right 75 deg; Left 78 deg   LOWER EXTREMITY ROM:   ROM 02/24/23 Rt  02/24/23 Left Right 03/04/23  Knee flexion A: 88 P: 95 A: 130 A: 105 AA: 108  Knee extension A: 10 P: -6 A: 0 A: -2 (LAQ) P: 0   (Blank rows = not tested)  LOWER EXTREMITY MMT:  MMT Right 02/24/23 Left 02/24/23  Hip flexion 4 4  Hip extension    Hip abduction 5 5  Hip adduction 5 5  Hip internal rotation    Hip external rotation    Knee flexion 3 5  Knee extension 3 5  Ankle dorsiflexion    Ankle plantarflexion    Ankle inversion    Ankle eversion     (Blank rows = not tested)  FUNCTIONAL TESTS:   02/24/23: 5 time sit to stand: 15 seconds UE support  GAIT: 02/24/2023 Distance walked: 30 feet level surface Assistive device utilized: Single point cane Level of assistance: Modified independence Comments: mild antalgic gait with increased knee flexion and hip flexion on the Rt.                                                                                                                                                                          TODAY'S TREATMENT                                                                        DATE: 03/09/2023 Therex: Nustep Lvl 6 x 6 mins UE/LE for ROM Calf stretch on slant board x 2 holding 30 sec Leg press bil 93 lbs 2x10,  Rt 50 lbs  2x10 Forward step ups with RLE onto  6" step x 15 reps each c No UE support Calf raises/toe raises: x 15 c UE support Supine bridges: 2 x 10  holding 5 sec Supine    Neuro Re-ed Standing vectors: using sliding disc toward cones placed ant/lat and post/lat x 10 c single UE support Rocker board: x 1 minute (df/pf) Rocker board: x 1 minute (inv/eversion) Airex mat: feet together eyes closed c close supervision to CGA x 20 sec Airex mat  staggered stance eyes open, supervision x 30 sec   TODAY'S TREATMENT                                                                        DATE:03/04/2023 Therex: Nustep Lvl 6 8 mins UE/LE for ROM Leg press bil 87 lbs 3x10,  Rt 43 lbs  3x10 Forward step ups with RLE onto 4" and 6" step x 10 reps each  RLE on 4" step with Lt heel taps 2x10; light UE support Calf raises x 20 reps Incline gastroc stretch 30 sec x 3 bilaterally LAQ x 10 reps on Rt AA heel slides x 10 reps ROM measurements - see above for details  Neuro Re-ed Sidestepping on foam beam x 5 laps in // bars without UE support Tandem walking in // bars on foam beam x 5 laps; intermittent UE support    TODAY'S TREATMENT                                                                        DATE:03/02/2023 Therex: Nustep Lvl 6 8 mins UE/LE for ROM Incline gastroc stretch 30 sec x 3 bilaterally Step on over and down 4 inch step WB on Rt leg x 15 with single hand assist on bar Leg press double leg 81 lbs x 15, single leg Rt 37 lbs 2 x 10  Seated SLR Rt 2 x 10    Neuro Re-ed Tandem stance 1 min x 1 bilateral with occasional HHA on bars Tandem ambulation fwd/back in // bars with occasional HHA 10 ft x 6 each way Standing ankle fwd/back weight shifting church pew exercise 2 mins c SBA - on foam Standing slow marching on foam x 10 bilateral with SBA   TODAY'S TREATMENT                                                                        DATE:02/24/23 Therex: HEP instruction/performance c cues for techniques, handout provided.  Trial set performed of each for comprehension and symptom assessment.  See below for exercise list  PATIENT EDUCATION:  02/24/2023 Education details: HEP, POC Person educated: Patient Education method: Explanation, Demonstration, Verbal cues, and Handouts Education comprehension: verbalized understanding, returned demonstration, and verbal cues required  HOME EXERCISE PROGRAM: Access Code:  [email or web address] URL: [email or web address] Date: 02/24/2023 Prepared by: Narda Amber  Exercises - Supine Quad Set on Towel Roll  -  3 x daily - 7 x weekly - 10-15 reps - 5 seconds hold - Supine Heel Slide with Strap  - 3 x daily - 7 x weekly - 10-15 reps - 5 seconds hold - Hooklying Straight Leg Raise  - 3 x daily - 7 x weekly - 10 reps - Seated Knee Flexion AAROM  - 3 x daily - 7 x weekly - 10 reps - 10 seconds hold - Sit to Stand with Counter Support  - 3 x daily - 7 x weekly - 10 reps  ASSESSMENT:  CLINICAL IMPRESSION: Pt arriving today with no device demonstrating step through gait pattern. Pt still with knee flexion noted during initial stance. Pt tolerating all exercises well for both quad strengthening and dynamic balance.Pt's active ROM today was 105, passive range 108 for flexion.  Recommended continued skilled PT interventions.   OBJECTIVE IMPAIRMENTS: decreased balance, decreased mobility, difficulty walking, decreased ROM, decreased strength, increased edema, impaired flexibility, and pain.   ACTIVITY LIMITATIONS: bending, sitting, standing, squatting, sleeping, stairs, transfers, and bed mobility  PARTICIPATION LIMITATIONS: cleaning, laundry, driving, and community activity  PERSONAL FACTORS: 3+ comorbidities: see pertinent history above  are also affecting patient's functional outcome.   REHAB POTENTIAL: Good  CLINICAL DECISION MAKING: Stable/uncomplicated  EVALUATION COMPLEXITY: Low   GOALS: Goals reviewed with patient? Yes  SHORT TERM GOALS: (target date for Short term goals are 3 weeks 03/19/23)   1.  Patient will demonstrate independent use of home exercise program to maintain progress from in clinic treatments.  Goal status: on going 03/02/2023  LONG TERM GOALS: (target dates for all long term goals are 10 weeks  05/07/23 )   1. Patient will demonstrate/report pain at worst less than or equal to 2/10 to facilitate minimal limitation in daily  activity secondary to pain symptoms.  Goal status: New   2. Patient will demonstrate independent use of home exercise program to facilitate ability to maintain/progress functional gains from skilled physical therapy services.  Goal status: New   3. Patient will demonstrate FOTO outcome > or = 55 % to indicate reduced disability due to condition.  Goal status: New   4.  Patient will demonstrate Rt LE MMT 5/5 throughout to faciltiate usual transfers, stairs, squatting at [facility] for daily life.   Goal status: New   5.  Patient will demonstrate up and down curb step with LRAD safely for community navigation.  Goal status: New   6.  Pt will be able to donn her socks independently c Rt knee flexion.  Goal status: New  7. Pt will be able to lift 15 # from floor to counter height safely with pain </= 2/10 in her Rt knee.   Goal Status: New  PLAN:  PT FREQUENCY: 2-3x/week  PT DURATION: 10 weeks  PLANNED INTERVENTIONS: Therapeutic exercises, Therapeutic activity, Neuro Muscular re-education, Balance training, Gait training, Patient/Family education, Joint mobilization, Stair training, DME instructions, Dry Needling, Electrical stimulation, Traction, Cryotherapy, vasopneumatic deviceMoist heat, Taping, Ultrasound, Ionotophoresis 4mg /ml Dexamethasone, and aquatic therapy, Manual therapy.  All included unless contraindicated  PLAN FOR NEXT SESSION: Progressive WB strengthening, balance    Next MD visit: Dr. August Saucer 03/17/23  Narda Amber, PT, MPT 03/09/23 8:38 AM   03/09/23 8:38 AM

## 2023-03-10 ENCOUNTER — Ambulatory Visit: Payer: 59 | Admitting: Physical Therapy

## 2023-03-10 ENCOUNTER — Encounter: Payer: Self-pay | Admitting: Physical Therapy

## 2023-03-10 DIAGNOSIS — R262 Difficulty in walking, not elsewhere classified: Secondary | ICD-10-CM | POA: Diagnosis not present

## 2023-03-10 DIAGNOSIS — M25661 Stiffness of right knee, not elsewhere classified: Secondary | ICD-10-CM | POA: Diagnosis not present

## 2023-03-10 DIAGNOSIS — M25561 Pain in right knee: Secondary | ICD-10-CM | POA: Diagnosis not present

## 2023-03-10 DIAGNOSIS — R600 Localized edema: Secondary | ICD-10-CM | POA: Diagnosis not present

## 2023-03-10 NOTE — Therapy (Signed)
OUTPATIENT PHYSICAL THERAPY TREATMENT   Patient Name: [name] [name] MRN: [id number] DOB:01-04-1956, 67 y.o., female Today's Date: 03/10/2023  END OF SESSION:  PT End of Session - 03/10/23 0920     Visit Number 5    Number of Visits 30    Date for PT Re-Evaluation 05/07/23    Authorization Type [employer]    PT Start Time 0825    PT Stop Time 0905    PT Time Calculation (min) 40 min    Activity Tolerance Patient tolerated treatment well    Behavior During Therapy WFL for tasks assessed/performed                 Past Medical History:  Diagnosis Date   Arthritis    Complication of anesthesia    Diabetes mellitus without complication (HCC)    Gallstones    Hypertension    PONV (postoperative nausea and vomiting)    Ruptured ovarian cyst    Past Surgical History:  Procedure Laterality Date   CESAREAN SECTION     COLONOSCOPY  06/05/2019   per Dr. Orvan Falconer, single adenomatous polyp, repeat in 7 yrs   KNEE ARTHROPLASTY Right 08/27/2021   Procedure: COMPUTER ASSISTED TOTAL KNEE ARTHROPLASTY;  Surgeon: Samson Frederic, MD;  Location: [facility];  Service: Orthopedics;  Laterality: Right;   KNEE CLOSED REDUCTION Right 11/20/2021   Procedure: CLOSED MANIPULATION KNEE;  Surgeon: Samson Frederic, MD;  Location: [facility];  Service: Orthopedics;  Laterality: Right;   lumpectomy,benign, right breast     x2   RIGHT SHOULDER SURGERY      TOTAL KNEE REVISION Right 02/02/2023   Procedure: REVISION RIGHT TOTAL KNEE ARTHROPLASTY;  Surgeon: Cammy Copa, MD;  Location: [facility] OR;  Service: Orthopedics;  Laterality: Right;   Patient Active Problem List   Diagnosis Date Noted   Pain due to total right knee replacement (HCC) 02/07/2023   S/P revision of total knee, right 02/02/2023   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   Primary hypertension 03/09/2022   Osteoarthritis of right knee 08/27/2021   S/P total knee arthroplasty, 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   COVID-19 virus infection  04/09/2021   Dyslipidemia 03/31/2021   Urinary frequency 10/29/2017   Diabetes mellitus without complication (HCC) 09/14/2014   Special screening for malignant neoplasms, colon 11/25/2011   Diverticulosis of colon (without mention of hemorrhage) 11/25/2011   URTICARIA 01/31/2010   ECZEMA 05/03/2009    PCP: Nelwyn Salisbury, MD   REFERRING PROVIDER: Julieanne Cotton, PA-C   REFERRING DIAG: [phone] (ICD-10-CM) - S/P revision of total knee, right  THERAPY DIAG:  Stiffness of right knee, not elsewhere classified  Difficulty in walking, not elsewhere classified  Acute pain of right knee  Localized edema  Rationale for Evaluation and Treatment: Rehabilitation  ONSET DATE: 02/02/23  SUBJECTIVE:   SUBJECTIVE STATEMENT: Today pt reported pain of 2/10 in her Rt knee. Pt stating she is able to sleep at night. Pt stating she is still having trouble with donning her socks and shoes.   PERTINENT HISTORY: OA, DM, HTN, initial Rt TKA 08/27/2021, closed reduction 11/20/21, Rt shoulder surgery    PAIN:  NPRS scale: 2/10 with wt bearing activities Pain location: Rt knee pain Pain description: achy Aggravating factors: being on my feet, walking Relieving factors: resting, ice, elevating  PRECAUTIONS: None  WEIGHT BEARING RESTRICTIONS: No  FALLS:  Has patient fallen in last 6 months? No  LIVING ENVIRONMENT: Lives with: lives with their family and lives with their spouse Lives  in: House/apartment Stairs: Yes: External: 5 steps; can reach both Has following equipment at home: st cane  OCCUPATION: works at [employer]  PLOF: Independent  PATIENT GOALS: get back to work  Next MD visit: Dr. August Saucer 03/17/23   OBJECTIVE:   DIAGNOSTIC FINDINGS: Result Narrative  AP, lateral views of right knee reviewed.  Right total knee revision implants in good position and alignment without any complicating features.  There is no evidence of prosthetic fracture, dislocation, loosening.  PATIENT  SURVEYS:  02/24/23: FOTO intake:  32  COGNITION: 02/24/2023 Overall cognitive status: WFL    SENSATION: 02/24/2023 WFL  EDEMA:   02/24/23: Rt Circumferential: 37.25 centimeters               : Left Circumferential: 34.75 centimeters  MUSCLE LENGTH: 02/24/23: Hamstrings: Right 75 deg; Left 78 deg   LOWER EXTREMITY ROM:   ROM 02/24/23 Rt  02/24/23 Left Right 03/04/23  Knee flexion A: 88 P: 95 A: 130 A: 105 AA: 108  Knee extension A: 10 P: -6 A: 0 A: -2 (LAQ) P: 0   (Blank rows = not tested)  LOWER EXTREMITY MMT:  MMT Right 02/24/23 Left 02/24/23  Hip flexion 4 4  Hip extension    Hip abduction 5 5  Hip adduction 5 5  Hip internal rotation    Hip external rotation    Knee flexion 3 5  Knee extension 3 5  Ankle dorsiflexion    Ankle plantarflexion    Ankle inversion    Ankle eversion     (Blank rows = not tested)  FUNCTIONAL TESTS:   02/24/23: 5 time sit to stand: 15 seconds UE support  GAIT: 02/24/2023 Distance walked: 30 feet level surface Assistive device utilized: Single point cane Level of assistance: Modified independence Comments: mild antalgic gait with increased knee flexion and hip flexion on the Rt.                                                                                                                                                                         TODAY'S TREATMENT                                                                        DATE: 03/10/2023 Therex: Scifit bike: level 3 x 10 minutes Leg press bil 93 lbs 2x10,  Rt 50 lbs  2x10 Forward step ups with RLE onto  6" step x 15 reps each c No UE support  Lateral straddle step over 6 inch step x 15 no UE support Leg extension machine: 10# 2 x 10 bil LE's extension and Rt LE eccentric lowering Leg Curls machine: 10# Rt LE only 2 x 10 Neuro Re-ed Side stepping over four 6 inch hurdles x 4 Forwrad stepping over four 6 inch hurdles x 6  Tandem walking 15 feet x 2 Backward walking 15  feet x 2 Side stepping green TB 25 feet x 4     TODAY'S TREATMENT                                                                        DATE: 03/09/2023 Therex: Nustep Lvl 6 x 6 mins UE/LE for ROM Calf stretch on slant board x 2 holding 30 sec Leg press bil 93 lbs 2x10,  Rt 50 lbs  2x10 Forward step ups with RLE onto  6" step x 15 reps each c No UE support Calf raises/toe raises: x 15 c UE support Supine bridges: 2 x 10 holding 5 sec  Neuro Re-ed Standing vectors: using sliding disc toward cones placed ant/lat and post/lat x 10 c single UE support Rocker board: x 1 minute (df/pf) Rocker board: x 1 minute (inv/eversion) Airex mat: feet together eyes closed c close supervision to CGA x 20 sec Airex mat staggered stance eyes open, supervision x 30 sec   TODAY'S TREATMENT                                                                        DATE:03/04/2023 Therex: Nustep Lvl 6 8 mins UE/LE for ROM Leg press bil 87 lbs 3x10,  Rt 43 lbs  3x10 Forward step ups with RLE onto 4" and 6" step x 10 reps each  RLE on 4" step with Lt heel taps 2x10; light UE support Calf raises x 20 reps Incline gastroc stretch 30 sec x 3 bilaterally LAQ x 10 reps on Rt AA heel slides x 10 reps ROM measurements - see above for details  Neuro Re-ed Sidestepping on foam beam x 5 laps in // bars without UE support Tandem walking in // bars on foam beam x 5 laps; intermittent UE support    TODAY'S TREATMENT                                                                        DATE:03/02/2023 Therex: Nustep Lvl 6 8 mins UE/LE for ROM Incline gastroc stretch 30 sec x 3 bilaterally Step on over and down 4 inch step WB on Rt leg x 15 with single hand assist on bar Leg press double leg 81 lbs x 15, single leg Rt 37 lbs 2 x 10  Seated SLR Rt 2  x 10    Neuro Re-ed Tandem stance 1 min x 1 bilateral with occasional HHA on bars Tandem ambulation fwd/back in // bars with occasional HHA 10 ft x 6 each  way Standing ankle fwd/back weight shifting church pew exercise 2 mins c SBA - on foam Standing slow marching on foam x 10 bilateral with SBA   TODAY'S TREATMENT                                                                        DATE:02/24/23 Therex: HEP instruction/performance c cues for techniques, handout provided.  Trial set performed of each for comprehension and symptom assessment.  See below for exercise list  PATIENT EDUCATION:  02/24/2023 Education details: HEP, POC Person educated: Patient Education method: Explanation, Demonstration, Verbal cues, and Handouts Education comprehension: verbalized understanding, returned demonstration, and verbal cues required  HOME EXERCISE PROGRAM: Access Code: [email or web address] URL: [email or web address] Date: 02/24/2023 Prepared by: Narda Amber  Exercises - Supine Quad Set on Towel Roll  - 3 x daily - 7 x weekly - 10-15 reps - 5 seconds hold - Supine Heel Slide with Strap  - 3 x daily - 7 x weekly - 10-15 reps - 5 seconds hold - Hooklying Straight Leg Raise  - 3 x daily - 7 x weekly - 10 reps - Seated Knee Flexion AAROM  - 3 x daily - 7 x weekly - 10 reps - 10 seconds hold - Sit to Stand with Counter Support  - 3 x daily - 7 x weekly - 10 reps  ASSESSMENT:  CLINICAL IMPRESSION: Treatment today focusing on strengthening and neuromuscular re-education. Pt with no reports of increased pain. Pt stating she would ice at home following treatment. Continue skilled PT interventions to maximize pt's function.   OBJECTIVE IMPAIRMENTS: decreased balance, decreased mobility, difficulty walking, decreased ROM, decreased strength, increased edema, impaired flexibility, and pain.   ACTIVITY LIMITATIONS: bending, sitting, standing, squatting, sleeping, stairs, transfers, and bed mobility  PARTICIPATION LIMITATIONS: cleaning, laundry, driving, and community activity  PERSONAL FACTORS: 3+ comorbidities: see pertinent history above  are  also affecting patient's functional outcome.   REHAB POTENTIAL: Good  CLINICAL DECISION MAKING: Stable/uncomplicated  EVALUATION COMPLEXITY: Low   GOALS: Goals reviewed with patient? Yes  SHORT TERM GOALS: (target date for Short term goals are 3 weeks 03/19/23)   1.  Patient will demonstrate independent use of home exercise program to maintain progress from in clinic treatments.  Goal status: on going 03/02/2023  LONG TERM GOALS: (target dates for all long term goals are 10 weeks  05/07/23 )   1. Patient will demonstrate/report pain at worst less than or equal to 2/10 to facilitate minimal limitation in daily activity secondary to pain symptoms.  Goal status: New   2. Patient will demonstrate independent use of home exercise program to facilitate ability to maintain/progress functional gains from skilled physical therapy services.  Goal status: New   3. Patient will demonstrate FOTO outcome > or = 55 % to indicate reduced disability due to condition.  Goal status: New   4.  Patient will demonstrate Rt LE MMT 5/5 throughout to faciltiate usual transfers, stairs, squatting at [facility] for daily life.   Goal status: New   5.  Patient will demonstrate up and down curb step with LRAD safely for community navigation.  Goal status: New   6.  Pt will be able to donn her socks independently c Rt knee flexion.  Goal status: New  7. Pt will be able to lift 15 # from floor to counter height safely with pain </= 2/10 in her Rt knee.   Goal Status: New  PLAN:  PT FREQUENCY: 2-3x/week  PT DURATION: 10 weeks  PLANNED INTERVENTIONS: Therapeutic exercises, Therapeutic activity, Neuro Muscular re-education, Balance training, Gait training, Patient/Family education, Joint mobilization, Stair training, DME instructions, Dry Needling, Electrical stimulation, Traction, Cryotherapy, vasopneumatic deviceMoist heat, Taping, Ultrasound, Ionotophoresis 4mg /ml Dexamethasone, and aquatic therapy,  Manual therapy.  All included unless contraindicated  PLAN FOR NEXT SESSION: Progressive WB strengthening, balance    Next MD visit: Dr. August Saucer 03/17/23  Narda Amber, PT, MPT 03/10/23 9:23 AM   03/10/23 9:23 AM

## 2023-03-15 ENCOUNTER — Encounter: Payer: 59 | Admitting: Physical Therapy

## 2023-03-16 ENCOUNTER — Encounter: Payer: Self-pay | Admitting: Family Medicine

## 2023-03-16 ENCOUNTER — Ambulatory Visit: Payer: 59 | Admitting: Family Medicine

## 2023-03-16 ENCOUNTER — Telehealth: Payer: 59 | Admitting: Family Medicine

## 2023-03-16 DIAGNOSIS — Z20822 Contact with and (suspected) exposure to covid-19: Secondary | ICD-10-CM | POA: Diagnosis not present

## 2023-03-16 DIAGNOSIS — U071 COVID-19: Secondary | ICD-10-CM | POA: Diagnosis not present

## 2023-03-16 LAB — POC COVID19 BINAXNOW: SARS Coronavirus 2 Ag: POSITIVE — AB

## 2023-03-16 MED ORDER — NIRMATRELVIR/RITONAVIR (PAXLOVID)TABLET: 3.0000 | ORAL_TABLET | Freq: Two times a day (BID) | ORAL | 0 refills | Status: AC

## 2023-03-16 NOTE — Progress Notes (Signed)
Subjective:    Patient ID: [name] [name], female    DOB: 03/20/1956, 67 y.o.   MRN: [id number]  HPI Virtual Visit via Video Note  I connected with the patient on 03/16/23 at  3:00 PM EDT by a video enabled telemedicine application and verified that I am speaking with the correct person using two identifiers.  Location patient: home Location provider:work or home office Persons participating in the virtual visit: patient, provider  I discussed the limitations of evaluation and management by telemedicine and the availability of in person appointments. The patient expressed understanding and agreed to proceed.   HPI: Here for a Covid infection. 4 days ago she developed fever, headache, body aches, and a dry cough. She vomited once yesterday, but there is no nausea today. Drinking fluids. She has taken Theraflu and Mucinex. She tests positive here today for the Covid virus.    ROS: See pertinent positives and negatives per HPI.  Past Medical History:  Diagnosis Date   Arthritis    Complication of anesthesia    Diabetes mellitus without complication (HCC)    Gallstones    Hypertension    PONV (postoperative nausea and vomiting)    Ruptured ovarian cyst     Past Surgical History:  Procedure Laterality Date   CESAREAN SECTION     COLONOSCOPY  06/05/2019   per Dr. Orvan Falconer, single adenomatous polyp, repeat in 7 yrs   KNEE ARTHROPLASTY Right 08/27/2021   Procedure: COMPUTER ASSISTED TOTAL KNEE ARTHROPLASTY;  Surgeon: Samson Frederic, MD;  Location: [facility];  Service: Orthopedics;  Laterality: Right;   KNEE CLOSED REDUCTION Right 11/20/2021   Procedure: CLOSED MANIPULATION KNEE;  Surgeon: Samson Frederic, MD;  Location: [facility];  Service: Orthopedics;  Laterality: Right;   lumpectomy,benign, right breast     x2   RIGHT SHOULDER SURGERY      TOTAL KNEE REVISION Right 02/02/2023   Procedure: REVISION RIGHT TOTAL KNEE ARTHROPLASTY;  Surgeon: Cammy Copa, MD;  Location: [facility] OR;   Service: Orthopedics;  Laterality: Right;    Family History  Problem Relation Age of Onset   Cancer Other        breast cancer   Colon cancer Neg Hx    Esophageal cancer Neg Hx    Stomach cancer Neg Hx    Rectal cancer Neg Hx      Current Outpatient Medications:    BAYER LOW DOSE 81 MG chewable tablet, CHEW 1 TABLET (81 MG TOTAL) BY MOUTH 2 (TWO) TIMES DAILY., Disp: 60 tablet, Rfl: 0   celecoxib (CELEBREX) 100 MG capsule, TAKE 1 CAPSULE BY MOUTH TWICE A DAY, Disp: 60 capsule, Rfl: 0   docusate sodium (COLACE) 100 MG capsule, Take 1 capsule (100 mg total) by mouth 2 (two) times daily., Disp: 10 capsule, Rfl: 0   losartan (COZAAR) 50 MG tablet, Take 1 tablet (50 mg total) by mouth daily., Disp: 90 tablet, Rfl: 3   metFORMIN (GLUCOPHAGE) 1000 MG tablet, Take 0.5 tablets (500 mg total) by mouth 2 (two) times daily with a meal., Disp: 180 tablet, Rfl: 3   methocarbamol (ROBAXIN) 500 MG tablet, Take 1 tablet (500 mg total) by mouth every 8 (eight) hours as needed for muscle spasms., Disp: 30 tablet, Rfl: 0   Omega-3 Fatty Acids (FISH OIL PO), Take 1 capsule by mouth daily., Disp: , Rfl:    oxyCODONE (OXY IR/ROXICODONE) 5 MG immediate release tablet, Take 1 tablet (5 mg total) by mouth every 4 (four) hours as needed for  moderate pain (pain score 4-6)., Disp: 30 tablet, Rfl: 0   sitaGLIPtin (JANUVIA) 50 MG tablet, Take 1 tablet (50 mg total) by mouth daily., Disp: 30 tablet, Rfl: 2  EXAM:  VITALS per patient if applicable:  GENERAL: alert, oriented, appears well and in no acute distress  HEENT: atraumatic, conjunttiva clear, no obvious abnormalities on inspection of external nose and ears  NECK: normal movements of the head and neck  LUNGS: on inspection no signs of respiratory distress, breathing rate appears normal, no obvious gross SOB, gasping or wheezing  CV: no obvious cyanosis  MS: moves all visible extremities without noticeable abnormality  PSYCH/NEURO: pleasant and  cooperative, no obvious depression or anxiety, speech and thought processing grossly intact  ASSESSMENT AND PLAN: Covid infection, treat with 5 days of Paxlovid. Recheck as needed.  Gershon Crane, MD  Discussed the following assessment and plan:  Close exposure to COVID-19 virus - Plan: POC COVID-19     I discussed the assessment and treatment plan with the patient. The patient was provided an opportunity to ask questions and all were answered. The patient agreed with the plan and demonstrated an understanding of the instructions.   The patient was advised to call back or seek an in-person evaluation if the symptoms worsen or if the condition fails to improve as anticipated.      Review of Systems     Objective:   Physical Exam        Assessment & Plan:

## 2023-03-17 ENCOUNTER — Encounter: Payer: 59 | Admitting: Orthopedic Surgery

## 2023-03-18 ENCOUNTER — Encounter: Payer: 59 | Admitting: Physical Therapy

## 2023-03-22 ENCOUNTER — Telehealth: Payer: Self-pay | Admitting: Family Medicine

## 2023-03-22 ENCOUNTER — Encounter: Payer: 59 | Admitting: Physical Therapy

## 2023-03-22 NOTE — Telephone Encounter (Signed)
Pt had a virtual appt with dr fry on 03-16-2023 dx with covid. Pt still has chest congestion,cough and running nose and would like something sent to  [id number] - [address] [place] - [address] Phone: [phone]  Fax: [phone]

## 2023-03-23 MED ORDER — HYDROCODONE BIT-HOMATROP MBR 5-1.5 MG/5ML PO SOLN: 5.0000 mL | ORAL | 0 refills | Status: DC | PRN

## 2023-03-23 MED ORDER — AZITHROMYCIN 250 MG PO TABS: ORAL_TABLET | ORAL | 0 refills | Status: DC

## 2023-03-23 NOTE — Telephone Encounter (Signed)
I sent in a Zpack and some cough syrup

## 2023-03-24 ENCOUNTER — Encounter: Payer: 59 | Admitting: Physical Therapy

## 2023-03-24 NOTE — Telephone Encounter (Signed)
Left a detailed message with the information below at the patient's cell number. ?

## 2023-03-25 ENCOUNTER — Telehealth: Payer: Self-pay | Admitting: Family Medicine

## 2023-03-25 NOTE — Telephone Encounter (Signed)
Pt states she is allergic to HYDROcodone bit-homatropine (HYCODAN) 5-1.5 MG/5ML syrup. Says she had a reaction. Patient's line went silent,called her back and she still could not hear me talking.

## 2023-03-25 NOTE — Telephone Encounter (Signed)
Spoke with pt advised of Dr Fry recommendation, verbalized understanding 

## 2023-03-25 NOTE — Addendum Note (Signed)
Addended by: Gershon Crane A on: 03/25/2023 12:22 PM   Modules accepted: Orders

## 2023-03-25 NOTE — Telephone Encounter (Signed)
I understand. I will cancel the Hycodan syrup. She can take Delsym OTC as needed

## 2023-03-25 NOTE — Telephone Encounter (Signed)
Please advise 

## 2023-03-29 ENCOUNTER — Encounter: Payer: Self-pay | Admitting: Nurse Practitioner

## 2023-03-30 ENCOUNTER — Encounter: Payer: Self-pay | Admitting: Physical Therapy

## 2023-03-30 ENCOUNTER — Ambulatory Visit: Payer: 59 | Admitting: Physical Therapy

## 2023-03-30 DIAGNOSIS — R262 Difficulty in walking, not elsewhere classified: Secondary | ICD-10-CM | POA: Diagnosis not present

## 2023-03-30 DIAGNOSIS — M25661 Stiffness of right knee, not elsewhere classified: Secondary | ICD-10-CM | POA: Diagnosis not present

## 2023-03-30 DIAGNOSIS — R600 Localized edema: Secondary | ICD-10-CM | POA: Diagnosis not present

## 2023-03-30 DIAGNOSIS — M25561 Pain in right knee: Secondary | ICD-10-CM | POA: Diagnosis not present

## 2023-03-30 NOTE — Therapy (Signed)
OUTPATIENT PHYSICAL THERAPY TREATMENT   Patient Name: [name] [name] MRN: [id number] DOB:Dec 21, 1955, 67 y.o., female Today's Date: 03/30/2023  END OF SESSION:  PT End of Session - 03/30/23 0855     Visit Number 6    Number of Visits 30    Date for PT Re-Evaluation 05/07/23    Authorization Type [employer]    PT Start Time 0845    PT Stop Time 0930    PT Time Calculation (min) 45 min    Activity Tolerance Patient tolerated treatment well    Behavior During Therapy WFL for tasks assessed/performed                  Past Medical History:  Diagnosis Date   Arthritis    Complication of anesthesia    Diabetes mellitus without complication (HCC)    Gallstones    Hypertension    PONV (postoperative nausea and vomiting)    Ruptured ovarian cyst    Past Surgical History:  Procedure Laterality Date   CESAREAN SECTION     COLONOSCOPY  06/05/2019   per Dr. Orvan Falconer, single adenomatous polyp, repeat in 7 yrs   KNEE ARTHROPLASTY Right 08/27/2021   Procedure: COMPUTER ASSISTED TOTAL KNEE ARTHROPLASTY;  Surgeon: Samson Frederic, MD;  Location: [facility];  Service: Orthopedics;  Laterality: Right;   KNEE CLOSED REDUCTION Right 11/20/2021   Procedure: CLOSED MANIPULATION KNEE;  Surgeon: Samson Frederic, MD;  Location: [facility];  Service: Orthopedics;  Laterality: Right;   lumpectomy,benign, right breast     x2   RIGHT SHOULDER SURGERY      TOTAL KNEE REVISION Right 02/02/2023   Procedure: REVISION RIGHT TOTAL KNEE ARTHROPLASTY;  Surgeon: Cammy Copa, MD;  Location: [facility] OR;  Service: Orthopedics;  Laterality: Right;   Patient Active Problem List   Diagnosis Date Noted   Pain due to total right knee replacement (HCC) 02/07/2023   S/P revision of total knee, right 02/02/2023   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   Primary hypertension 03/09/2022   Osteoarthritis of right knee 08/27/2021   S/P total knee arthroplasty, 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   COVID-19 virus infection  04/09/2021   Dyslipidemia 03/31/2021   Urinary frequency 10/29/2017   Diabetes mellitus without complication (HCC) 09/14/2014   Special screening for malignant neoplasms, colon 11/25/2011   Diverticulosis of colon (without mention of hemorrhage) 11/25/2011   URTICARIA 01/31/2010   ECZEMA 05/03/2009    PCP: Nelwyn Salisbury, MD   REFERRING PROVIDER: Julieanne Cotton, PA-C   REFERRING DIAG: [phone] (ICD-10-CM) - S/P revision of total knee, right  THERAPY DIAG:  Difficulty in walking, not elsewhere classified  Stiffness of right knee, not elsewhere classified  Acute pain of right knee  Localized edema  Rationale for Evaluation and Treatment: Rehabilitation  ONSET DATE: 02/02/23  SUBJECTIVE:   SUBJECTIVE STATEMENT: Pt arriving today reporting 2-3/10 pain in her Rt knee. Pt stating she is experiencing some numbness on the outside of her knee.   PERTINENT HISTORY: OA, DM, HTN, initial Rt TKA 08/27/2021, closed reduction 11/20/21, Rt shoulder surgery    PAIN:  NPRS scale: 2-3/10 with wt bearing activities Pain location: Rt knee pain Pain description: achy Aggravating factors: being on my feet, walking Relieving factors: resting, ice, elevating  PRECAUTIONS: None  WEIGHT BEARING RESTRICTIONS: No  FALLS:  Has patient fallen in last 6 months? No  LIVING ENVIRONMENT: Lives with: lives with their family and lives with their spouse Lives in: House/apartment Stairs: Yes: External: 5 steps; can  reach both Has following equipment at home: st cane  OCCUPATION: works at [employer]  PLOF: Independent  PATIENT GOALS: get back to work  Next MD visit: Dr. August Saucer 03/17/23   OBJECTIVE:   DIAGNOSTIC FINDINGS: Result Narrative  AP, lateral views of right knee reviewed.  Right total knee revision implants in good position and alignment without any complicating features.  There is no evidence of prosthetic fracture, dislocation, loosening.  PATIENT SURVEYS:  02/24/23: FOTO  intake:  32 03/30/23: FOTO update 63  COGNITION: 02/24/2023 Overall cognitive status: WFL    SENSATION: 02/24/2023 WFL  EDEMA:   02/24/23: Rt Circumferential: 37.25 centimeters               : Left Circumferential: 34.75 centimeters  MUSCLE LENGTH: 02/24/23: Hamstrings: Right 75 deg; Left 78 deg   LOWER EXTREMITY ROM:   ROM 02/24/23 Rt  02/24/23 Left Right 03/04/23 Rt 03/30/23  Knee flexion A: 88 P: 95 A: 130 A: 105 AA: 108 A: 110  Knee extension A: 10 P: -6 A: 0 A: -2 (LAQ) P: 0 A: 0   (Blank rows = not tested)  LOWER EXTREMITY MMT:  MMT Right 02/24/23 Left 02/24/23  Hip flexion 4 4  Hip extension    Hip abduction 5 5  Hip adduction 5 5  Hip internal rotation    Hip external rotation    Knee flexion 3 5  Knee extension 3 5  Ankle dorsiflexion    Ankle plantarflexion    Ankle inversion    Ankle eversion     (Blank rows = not tested)  FUNCTIONAL TESTS:   02/24/23: 5 time sit to stand: 15 seconds UE support  GAIT: 02/24/2023 Distance walked: 30 feet level surface Assistive device utilized: Single point cane Level of assistance: Modified independence Comments: mild antalgic gait with increased knee flexion and hip flexion on the Rt.                                                                                                                                                                         TODAY'S TREATMENT                                                                        DATE: 03/30/2023 Therex: Nustep: level 5 x 10 minutes while answering FOTO questions Leg press bil 93 lbs 2x10,  Rt 50 lbs  2x10 Forward step ups with RLE onto  6" step x 15 reps  each c No UE support Leg extension machine: 10# 2 x 10 bil LE's extension and Rt LE eccentric lowering Leg Curls machine: 10# Rt LE only 2 x 10 Neuro Re-ed Stepping over 6 inch hurdles (6 hurdles) x 4  Side stepping over 6 inch hurdles (6 hurdles) x 2 each direction Tapping cones placed in front of pt's  foot alternating x 30  Modalites:  Vasopneumatic device: 34 deg, x 10 minutes, moderate compression, bil LE elevated       TODAY'S TREATMENT                                                                        DATE: 03/10/2023 Therex: Scifit bike: level 3 x 10 minutes Leg press bil 93 lbs 2x10,  Rt 50 lbs  2x10 Forward step ups with RLE onto  6" step x 15 reps each c No UE support Lateral straddle step over 6 inch step x 15 no UE support Leg extension machine: 10# 2 x 10 bil LE's extension and Rt LE eccentric lowering Leg Curls machine: 10# Rt LE only 2 x 10 Neuro Re-ed Side stepping over four 6 inch hurdles x 4 Forwrad stepping over four 6 inch hurdles x 6  Tandem walking 15 feet x 2 Backward walking 15 feet x 2 Side stepping green TB 25 feet x 4     TODAY'S TREATMENT                                                                        DATE: 03/09/2023 Therex: Nustep Lvl 6 x 6 mins UE/LE for ROM Calf stretch on slant board x 2 holding 30 sec Leg press bil 93 lbs 2x10,  Rt 50 lbs  2x10 Forward step ups with RLE onto  6" step x 15 reps each c No UE support Calf raises/toe raises: x 15 c UE support Supine bridges: 2 x 10 holding 5 sec  Neuro Re-ed Standing vectors: using sliding disc toward cones placed ant/lat and post/lat x 10 c single UE support Rocker board: x 1 minute (df/pf) Rocker board: x 1 minute (inv/eversion) Airex mat: feet together eyes closed c close supervision to CGA x 20 sec Airex mat staggered stance eyes open, supervision x 30 sec   TODAY'S TREATMENT                                                                        DATE:03/04/2023 Therex: Nustep Lvl 6 8 mins UE/LE for ROM Leg press bil 87 lbs 3x10,  Rt 43 lbs  3x10 Forward step ups with RLE onto 4" and 6" step x 10 reps each  RLE on 4" step with Lt heel taps 2x10; light UE  support Calf raises x 20 reps Incline gastroc stretch 30 sec x 3 bilaterally LAQ x 10 reps on Rt AA heel slides x 10  reps ROM measurements - see above for details  Neuro Re-ed Sidestepping on foam beam x 5 laps in // bars without UE support Tandem walking in // bars on foam beam x 5 laps; intermittent UE support  PATIENT EDUCATION:  02/24/2023 Education details: HEP, POC Person educated: Patient Education method: Programmer, multimedia, Demonstration, Verbal cues, and Handouts Education comprehension: verbalized understanding, returned demonstration, and verbal cues required  HOME EXERCISE PROGRAM: Access Code: [email or web address] URL: [email or web address] Date: 02/24/2023 Prepared by: Narda Amber  Exercises - Supine Quad Set on Towel Roll  - 3 x daily - 7 x weekly - 10-15 reps - 5 seconds hold - Supine Heel Slide with Strap  - 3 x daily - 7 x weekly - 10-15 reps - 5 seconds hold - Hooklying Straight Leg Raise  - 3 x daily - 7 x weekly - 10 reps - Seated Knee Flexion AAROM  - 3 x daily - 7 x weekly - 10 reps - 10 seconds hold - Sit to Stand with Counter Support  - 3 x daily - 7 x weekly - 10 reps  ASSESSMENT:  CLINICAL IMPRESSION: Pt has made progress in her active ROM after missing last week due to sickness. Pt's current arc of motion in her Rt knee is 0-110 degrees.  Pt continuing to work on overall strengthening and dynamic balance activities. Recommending continued skilled PT interventions progressing toward pt's LTG's.   OBJECTIVE IMPAIRMENTS: decreased balance, decreased mobility, difficulty walking, decreased ROM, decreased strength, increased edema, impaired flexibility, and pain.   ACTIVITY LIMITATIONS: bending, sitting, standing, squatting, sleeping, stairs, transfers, and bed mobility  PARTICIPATION LIMITATIONS: cleaning, laundry, driving, and community activity  PERSONAL FACTORS: 3+ comorbidities: see pertinent history above  are also affecting patient's functional outcome.   REHAB POTENTIAL: Good  CLINICAL DECISION MAKING: Stable/uncomplicated  EVALUATION COMPLEXITY:  Low   GOALS: Goals reviewed with patient? Yes  SHORT TERM GOALS: (target date for Short term goals are 3 weeks 03/19/23)   1.  Patient will demonstrate independent use of home exercise program to maintain progress from in clinic treatments.  Goal status: on going 03/02/2023  LONG TERM GOALS: (target dates for all long term goals are 10 weeks  05/07/23 )   1. Patient will demonstrate/report pain at worst less than or equal to 2/10 to facilitate minimal limitation in daily activity secondary to pain symptoms.  Goal status: New   2. Patient will demonstrate independent use of home exercise program to facilitate ability to maintain/progress functional gains from skilled physical therapy services.  Goal status: New   3. Patient will demonstrate FOTO outcome > or = 55 % to indicate reduced disability due to condition.  Goal status: New   4.  Patient will demonstrate Rt LE MMT 5/5 throughout to faciltiate usual transfers, stairs, squatting at [facility] for daily life.   Goal status: New   5.  Patient will demonstrate up and down curb step with LRAD safely for community navigation.  Goal status: New   6.  Pt will be able to donn her socks independently c Rt knee flexion.  Goal status: New  7. Pt will be able to lift 15 # from floor to counter height safely with pain </= 2/10 in her Rt knee.   Goal Status: New  PLAN:  PT FREQUENCY: 2-3x/week  PT DURATION: 10 weeks  PLANNED  INTERVENTIONS: Therapeutic exercises, Therapeutic activity, Neuro Muscular re-education, Balance training, Gait training, Patient/Family education, Joint mobilization, Stair training, DME instructions, Dry Needling, Electrical stimulation, Traction, Cryotherapy, vasopneumatic deviceMoist heat, Taping, Ultrasound, Ionotophoresis 4mg /ml Dexamethasone, and aquatic therapy, Manual therapy.  All included unless contraindicated  PLAN FOR NEXT SESSION: Vaso as needed, Progressive WB strengthening, balance/NMR Send Progress  Note to Dr August Saucer   Next MD visit: Dr. August Saucer 04/02/23   Narda Amber, PT, MPT 03/30/23 8:56 AM   03/30/23 8:56 AM

## 2023-03-31 NOTE — Therapy (Signed)
OUTPATIENT PHYSICAL THERAPY TREATMENT   Patient Name: [name] [name] MRN: [id number] DOB:03-06-1956, 67 y.o., female Today's Date: 03/31/2023  END OF SESSION:         Past Medical History:  Diagnosis Date   Arthritis    Complication of anesthesia    Diabetes mellitus without complication (HCC)    Gallstones    Hypertension    PONV (postoperative nausea and vomiting)    Ruptured ovarian cyst    Past Surgical History:  Procedure Laterality Date   CESAREAN SECTION     COLONOSCOPY  06/05/2019   per Dr. Orvan Falconer, single adenomatous polyp, repeat in 7 yrs   KNEE ARTHROPLASTY Right 08/27/2021   Procedure: COMPUTER ASSISTED TOTAL KNEE ARTHROPLASTY;  Surgeon: Samson Frederic, MD;  Location: [facility];  Service: Orthopedics;  Laterality: Right;   KNEE CLOSED REDUCTION Right 11/20/2021   Procedure: CLOSED MANIPULATION KNEE;  Surgeon: Samson Frederic, MD;  Location: [facility];  Service: Orthopedics;  Laterality: Right;   lumpectomy,benign, right breast     x2   RIGHT SHOULDER SURGERY      TOTAL KNEE REVISION Right 02/02/2023   Procedure: REVISION RIGHT TOTAL KNEE ARTHROPLASTY;  Surgeon: Cammy Copa, MD;  Location: [facility] OR;  Service: Orthopedics;  Laterality: Right;   Patient Active Problem List   Diagnosis Date Noted   Pain due to total right knee replacement (HCC) 02/07/2023   S/P revision of total knee, right 02/02/2023   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   Primary hypertension 03/09/2022   Osteoarthritis of right knee 08/27/2021   S/P total knee arthroplasty, 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   COVID-19 virus infection 04/09/2021   Dyslipidemia 03/31/2021   Urinary frequency 10/29/2017   Diabetes mellitus without complication (HCC) 09/14/2014   Special screening for malignant neoplasms, colon 11/25/2011   Diverticulosis of colon (without mention of hemorrhage) 11/25/2011   URTICARIA 01/31/2010   ECZEMA 05/03/2009    PCP: Nelwyn Salisbury, MD   REFERRING PROVIDER:  Julieanne Cotton, PA-C   REFERRING DIAG: [phone] (ICD-10-CM) - S/P revision of total knee, right  THERAPY DIAG:  No diagnosis found.  Rationale for Evaluation and Treatment: Rehabilitation  ONSET DATE: 02/02/23  SUBJECTIVE:   SUBJECTIVE STATEMENT: Pt arriving today reporting 2-3/10 pain in her Rt knee. Pt stating she is experiencing some numbness on the outside of her knee.   PERTINENT HISTORY: OA, DM, HTN, initial Rt TKA 08/27/2021, closed reduction 11/20/21, Rt shoulder surgery    PAIN:  NPRS scale: 2-3/10 with wt bearing activities Pain location: Rt knee pain Pain description: achy Aggravating factors: being on my feet, walking Relieving factors: resting, ice, elevating  PRECAUTIONS: None  WEIGHT BEARING RESTRICTIONS: No  FALLS:  Has patient fallen in last 6 months? No  LIVING ENVIRONMENT: Lives with: lives with their family and lives with their spouse Lives in: House/apartment Stairs: Yes: External: 5 steps; can reach both Has following equipment at home: st cane  OCCUPATION: works at [employer]  PLOF: Independent  PATIENT GOALS: get back to work  Next MD visit: Dr. August Saucer 03/17/23   OBJECTIVE:   DIAGNOSTIC FINDINGS: Result Narrative  AP, lateral views of right knee reviewed.  Right total knee revision implants in good position and alignment without any complicating features.  There is no evidence of prosthetic fracture, dislocation, loosening.  PATIENT SURVEYS:  02/24/23: FOTO intake:  32 03/30/23: FOTO update 63  COGNITION: 02/24/2023 Overall cognitive status: WFL    SENSATION: 02/24/2023 WFL  EDEMA:   02/24/23: Rt Circumferential: 37.25  centimeters               : Left Circumferential: 34.75 centimeters  MUSCLE LENGTH: 02/24/23: Hamstrings: Right 75 deg; Left 78 deg   LOWER EXTREMITY ROM:   ROM 02/24/23 Rt  02/24/23 Left Right 03/04/23 Rt 03/30/23  Knee flexion A: 88 P: 95 A: 130 A: 105 AA: 108 A: 110  Knee extension A: 10 P: -6 A: 0 A: -2  (LAQ) P: 0 A: 0   (Blank rows = not tested)  LOWER EXTREMITY MMT:  MMT Right 02/24/23 Left 02/24/23  Hip flexion 4 4  Hip extension    Hip abduction 5 5  Hip adduction 5 5  Hip internal rotation    Hip external rotation    Knee flexion 3 5  Knee extension 3 5  Ankle dorsiflexion    Ankle plantarflexion    Ankle inversion    Ankle eversion     (Blank rows = not tested)  FUNCTIONAL TESTS:   02/24/23: 5 time sit to stand: 15 seconds UE support  GAIT: 02/24/2023 Distance walked: 30 feet level surface Assistive device utilized: Single point cane Level of assistance: Modified independence Comments: mild antalgic gait with increased knee flexion and hip flexion on the Rt.                                                                                                                                                                         TODAY'S TREATMENT                                                                        DATE: 04/01/2023 Therex: Nustep: level 5 x 10 minutes while answering FOTO questions Leg press bil 93 lbs 2x10,  Rt 50 lbs  2x10 Forward step ups with RLE onto  6" step x 15 reps each c No UE support Leg extension machine: 10# 2 x 10 bil LE's extension and Rt LE eccentric lowering Leg Curls machine: 10# Rt LE only 2 x 10 Neuro Re-ed Stepping over 6 inch hurdles (6 hurdles) x 4  Side stepping over 6 inch hurdles (6 hurdles) x 2 each direction Tapping cones placed in front of pt's foot alternating x 30  Modalites:  Vasopneumatic device: 34 deg, x 10 minutes, moderate compression, bil LE elevated  TODAY'S TREATMENT  DATE: 03/30/2023 Therex: Nustep: level 5 x 10 minutes while answering FOTO questions Leg press bil 93 lbs 2x10,  Rt 50 lbs  2x10 Forward step ups with RLE onto  6" step x 15 reps each c No UE support Leg extension machine: 10# 2 x 10 bil LE's extension and Rt LE eccentric  lowering Leg Curls machine: 10# Rt LE only 2 x 10 Neuro Re-ed Stepping over 6 inch hurdles (6 hurdles) x 4  Side stepping over 6 inch hurdles (6 hurdles) x 2 each direction Tapping cones placed in front of pt's foot alternating x 30  Modalites:  Vasopneumatic device: 34 deg, x 10 minutes, moderate compression, bil LE elevated   TODAY'S TREATMENT                                                                        DATE: 03/10/2023 Therex: Scifit bike: level 3 x 10 minutes Leg press bil 93 lbs 2x10,  Rt 50 lbs  2x10 Forward step ups with RLE onto  6" step x 15 reps each c No UE support Lateral straddle step over 6 inch step x 15 no UE support Leg extension machine: 10# 2 x 10 bil LE's extension and Rt LE eccentric lowering Leg Curls machine: 10# Rt LE only 2 x 10 Neuro Re-ed Side stepping over four 6 inch hurdles x 4 Forwrad stepping over four 6 inch hurdles x 6  Tandem walking 15 feet x 2 Backward walking 15 feet x 2 Side stepping green TB 25 feet x 4     TODAY'S TREATMENT                                                                        DATE: 03/09/2023 Therex: Nustep Lvl 6 x 6 mins UE/LE for ROM Calf stretch on slant board x 2 holding 30 sec Leg press bil 93 lbs 2x10,  Rt 50 lbs  2x10 Forward step ups with RLE onto  6" step x 15 reps each c No UE support Calf raises/toe raises: x 15 c UE support Supine bridges: 2 x 10 holding 5 sec  Neuro Re-ed Standing vectors: using sliding disc toward cones placed ant/lat and post/lat x 10 c single UE support Rocker board: x 1 minute (df/pf) Rocker board: x 1 minute (inv/eversion) Airex mat: feet together eyes closed c close supervision to CGA x 20 sec Airex mat staggered stance eyes open, supervision x 30 sec    PATIENT EDUCATION:  02/24/2023 Education details: HEP, POC Person educated: Patient Education method: Programmer, multimedia, Facilities manager, Verbal cues, and Handouts Education comprehension: verbalized understanding, returned  demonstration, and verbal cues required  HOME EXERCISE PROGRAM: Access Code: [email or web address] URL: [email or web address] Date: 02/24/2023 Prepared by: Narda Amber  Exercises - Supine Quad Set on Towel Roll  - 3 x daily - 7 x weekly - 10-15 reps - 5 seconds hold - Supine Heel Slide with Strap  - 3 x daily - 7 x weekly - 10-15  reps - 5 seconds hold - Hooklying Straight Leg Raise  - 3 x daily - 7 x weekly - 10 reps - Seated Knee Flexion AAROM  - 3 x daily - 7 x weekly - 10 reps - 10 seconds hold - Sit to Stand with Counter Support  - 3 x daily - 7 x weekly - 10 reps  ASSESSMENT:  CLINICAL IMPRESSION: Pt has made progress in her active ROM after missing last week due to sickness. Pt's current arc of motion in her Rt knee is 0-110 degrees.  Pt continuing to work on overall strengthening and dynamic balance activities. Recommending continued skilled PT interventions progressing toward pt's LTG's.   OBJECTIVE IMPAIRMENTS: decreased balance, decreased mobility, difficulty walking, decreased ROM, decreased strength, increased edema, impaired flexibility, and pain.   ACTIVITY LIMITATIONS: bending, sitting, standing, squatting, sleeping, stairs, transfers, and bed mobility  PARTICIPATION LIMITATIONS: cleaning, laundry, driving, and community activity  PERSONAL FACTORS: 3+ comorbidities: see pertinent history above  are also affecting patient's functional outcome.   REHAB POTENTIAL: Good  CLINICAL DECISION MAKING: Stable/uncomplicated  EVALUATION COMPLEXITY: Low   GOALS: Goals reviewed with patient? Yes  SHORT TERM GOALS: (target date for Short term goals are 3 weeks 03/19/23)   1.  Patient will demonstrate independent use of home exercise program to maintain progress from in clinic treatments.  Goal status: on going 03/02/2023  LONG TERM GOALS: (target dates for all long term goals are 10 weeks  05/07/23 )   1. Patient will demonstrate/report pain at worst less than or equal  to 2/10 to facilitate minimal limitation in daily activity secondary to pain symptoms.  Goal status: New   2. Patient will demonstrate independent use of home exercise program to facilitate ability to maintain/progress functional gains from skilled physical therapy services.  Goal status: New   3. Patient will demonstrate FOTO outcome > or = 55 % to indicate reduced disability due to condition.  Goal status: New   4.  Patient will demonstrate Rt LE MMT 5/5 throughout to faciltiate usual transfers, stairs, squatting at [facility] for daily life.   Goal status: New   5.  Patient will demonstrate up and down curb step with LRAD safely for community navigation.  Goal status: New   6.  Pt will be able to donn her socks independently c Rt knee flexion.  Goal status: New  7. Pt will be able to lift 15 # from floor to counter height safely with pain </= 2/10 in her Rt knee.   Goal Status: New  PLAN:  PT FREQUENCY: 2-3x/week  PT DURATION: 10 weeks  PLANNED INTERVENTIONS: Therapeutic exercises, Therapeutic activity, Neuro Muscular re-education, Balance training, Gait training, Patient/Family education, Joint mobilization, Stair training, DME instructions, Dry Needling, Electrical stimulation, Traction, Cryotherapy, vasopneumatic deviceMoist heat, Taping, Ultrasound, Ionotophoresis 4mg /ml Dexamethasone, and aquatic therapy, Manual therapy.  All included unless contraindicated  PLAN FOR NEXT SESSION: Vaso as needed, Progressive WB strengthening, balance/NMR Send Progress Note to Dr August Saucer   Next MD visit: Dr. August Saucer 04/02/23    Chyrel Masson, PT, DPT, OCS, ATC 03/31/23  12:59 PM

## 2023-04-01 ENCOUNTER — Encounter: Payer: Self-pay | Admitting: Rehabilitative and Restorative Service Providers"

## 2023-04-01 ENCOUNTER — Ambulatory Visit: Payer: 59 | Admitting: Rehabilitative and Restorative Service Providers"

## 2023-04-01 DIAGNOSIS — M25661 Stiffness of right knee, not elsewhere classified: Secondary | ICD-10-CM

## 2023-04-01 DIAGNOSIS — R600 Localized edema: Secondary | ICD-10-CM | POA: Diagnosis not present

## 2023-04-01 DIAGNOSIS — R262 Difficulty in walking, not elsewhere classified: Secondary | ICD-10-CM

## 2023-04-01 DIAGNOSIS — M25561 Pain in right knee: Secondary | ICD-10-CM | POA: Diagnosis not present

## 2023-04-02 ENCOUNTER — Encounter: Payer: Self-pay | Admitting: Orthopedic Surgery

## 2023-04-02 ENCOUNTER — Ambulatory Visit (INDEPENDENT_AMBULATORY_CARE_PROVIDER_SITE_OTHER): Payer: 59 | Admitting: Orthopedic Surgery

## 2023-04-02 DIAGNOSIS — R200 Anesthesia of skin: Secondary | ICD-10-CM

## 2023-04-02 DIAGNOSIS — R202 Paresthesia of skin: Secondary | ICD-10-CM

## 2023-04-02 NOTE — Progress Notes (Signed)
Post-Op Visit Note   Patient: [name] [name]           Date of Birth: 01-20-1956           MRN: [id number] Visit Date: 04/02/2023 PCP: [name] Salisbury, MD   Assessment & Plan:  Chief Complaint:  Chief Complaint  Patient presents with   Right Knee - Routine Post Op    REVISION RIGHT TKA (surgery date 02-02-23)   Visit Diagnoses:  1. Numbness and tingling in right hand     Plan: [name] [name] is a patient is now about 2 months out revision right total knee replacement for metal allergy.  She states she is doing well.  Doing therapy 2 times a week.  Recently had COVID but she was able to continue home exercise program.  She is doing the bike along with box steps walking about 30 minutes every day and doing sidesteps with the band.  Sleeping well.  Stairs are manageable for her.  Patient also reports right hand pain with known history of carpal tunnel syndrome.  She was scheduled for surgery several years ago but did not go through with it.  Her pain has continued and she reports volar pain around the wrist as well as palmar numbness and tingling in digits 1 2 and 3.  Does report some loss of dexterity.  Has had an injection in the carpal tunnel which gave her only short-term relief.  Plan at this time is nerve conduction study of the right hand to evaluate carpal tunnel syndrome.  Range of motion in the right knee is 5-1 10.  Okay to discontinue physical therapy and continue with home exercise program.  Her job is quite vigorous and requires prolonged standing.  Her standing and walking endurance is not currently enough for return to work.  Anticipate about 6-8 more weeks of rehabilitation before returning to work.  I think she would be good to go towards the end of September 4 months out from her surgery date.  That would be within normal range for revision knee replacement surgery.  Follow-up after nerve conduction study.  Follow-Up Instructions: Return in about 2 months (around 06/03/2023).    Orders:  Orders Placed This Encounter  Procedures   Ambulatory referral to Physical Medicine Rehab   No orders of the defined types were placed in this encounter.   Imaging: No results found.  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   Primary hypertension 03/09/2022   Osteoarthritis of right knee 08/27/2021   S/P total knee arthroplasty, 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   COVID-19 virus infection 04/09/2021   Dyslipidemia 03/31/2021   Urinary frequency 10/29/2017   Diabetes mellitus without complication (HCC) 09/14/2014   Special screening for malignant neoplasms, colon 11/25/2011   Diverticulosis of colon (without mention of hemorrhage) 11/25/2011   URTICARIA 01/31/2010   ECZEMA 05/03/2009   Past Medical History:  Diagnosis Date   Arthritis    Complication of anesthesia    Diabetes mellitus without complication (HCC)    Gallstones    Hypertension    PONV (postoperative nausea and vomiting)    Ruptured ovarian cyst     Family History  Problem Relation Age of Onset   Cancer Other        breast cancer   Colon cancer Neg Hx  Esophageal cancer Neg Hx    Stomach cancer Neg Hx    Rectal cancer Neg Hx     Past Surgical History:  Procedure Laterality Date   CESAREAN SECTION     COLONOSCOPY  06/05/2019   per Dr. Orvan Falconer, single adenomatous polyp, repeat in 7 yrs   KNEE ARTHROPLASTY Right 08/27/2021   Procedure: COMPUTER ASSISTED TOTAL KNEE ARTHROPLASTY;  Surgeon: Samson Frederic, MD;  Location: [facility];  Service: Orthopedics;  Laterality: Right;   KNEE CLOSED REDUCTION Right 11/20/2021   Procedure: CLOSED MANIPULATION KNEE;  Surgeon: Samson Frederic, MD;  Location: [facility];  Service: Orthopedics;  Laterality: Right;   lumpectomy,benign, right breast     x2   RIGHT SHOULDER SURGERY      TOTAL  KNEE REVISION Right 02/02/2023   Procedure: REVISION RIGHT TOTAL KNEE ARTHROPLASTY;  Surgeon: Cammy Copa, MD;  Location: [facility] OR;  Service: Orthopedics;  Laterality: Right;   Social History   Occupational History   Occupation: food service  Tobacco Use   Smoking status: Never    Passive exposure: Never   Smokeless tobacco: Never  Vaping Use   Vaping status: Never Used  Substance and Sexual Activity   Alcohol use: Yes    Alcohol/week: 0.0 standard drinks of alcohol    Comment: maybe twice a month   Drug use: No   Sexual activity: Not on file

## 2023-04-03 ENCOUNTER — Other Ambulatory Visit: Payer: Self-pay | Admitting: Family Medicine

## 2023-04-05 ENCOUNTER — Ambulatory Visit: Payer: 59 | Admitting: Physical Therapy

## 2023-04-05 ENCOUNTER — Encounter: Payer: Self-pay | Admitting: Physical Therapy

## 2023-04-05 DIAGNOSIS — R262 Difficulty in walking, not elsewhere classified: Secondary | ICD-10-CM | POA: Diagnosis not present

## 2023-04-05 DIAGNOSIS — M25561 Pain in right knee: Secondary | ICD-10-CM | POA: Diagnosis not present

## 2023-04-05 DIAGNOSIS — M25661 Stiffness of right knee, not elsewhere classified: Secondary | ICD-10-CM | POA: Diagnosis not present

## 2023-04-05 DIAGNOSIS — R600 Localized edema: Secondary | ICD-10-CM

## 2023-04-05 NOTE — Therapy (Addendum)
OUTPATIENT PHYSICAL THERAPY TREATMENT /PROGRESS NOTE  / DISCHARGE   Patient Name: [name] [name] MRN: [id number] DOB:1956-08-03, 67 y.o., female Today's Date: 04/05/2023  Progress Note Reporting Period 02/24/2023 to 04/01/2023  See note below for Objective Data and Assessment of Progress/Goals.   END OF SESSION:  PT End of Session - 04/05/23 0847     Visit Number 8    Number of Visits 30    Date for PT Re-Evaluation 05/07/23    Authorization Type [employer]    Progress Note Due on Visit 17    PT Start Time 0846    PT Stop Time 0935    PT Time Calculation (min) 49 min    Activity Tolerance Patient tolerated treatment well    Behavior During Therapy WFL for tasks assessed/performed                   Past Medical History:  Diagnosis Date   Arthritis    Complication of anesthesia    Diabetes mellitus without complication (HCC)    Gallstones    Hypertension    PONV (postoperative nausea and vomiting)    Ruptured ovarian cyst    Past Surgical History:  Procedure Laterality Date   CESAREAN SECTION     COLONOSCOPY  06/05/2019   per Dr. Orvan Falconer, single adenomatous polyp, repeat in 7 yrs   KNEE ARTHROPLASTY Right 08/27/2021   Procedure: COMPUTER ASSISTED TOTAL KNEE ARTHROPLASTY;  Surgeon: Samson Frederic, MD;  Location: [facility];  Service: Orthopedics;  Laterality: Right;   KNEE CLOSED REDUCTION Right 11/20/2021   Procedure: CLOSED MANIPULATION KNEE;  Surgeon: Samson Frederic, MD;  Location: [facility];  Service: Orthopedics;  Laterality: Right;   lumpectomy,benign, right breast     x2   RIGHT SHOULDER SURGERY      TOTAL KNEE REVISION Right 02/02/2023   Procedure: REVISION RIGHT TOTAL KNEE ARTHROPLASTY;  Surgeon: Cammy Copa, MD;  Location: [facility];  Service: Orthopedics;  Laterality: Right;   Patient Active Problem List   Diagnosis Date Noted   Pain due to total right knee replacement (HCC) 02/07/2023   S/P revision of total knee, right 02/02/2023   Allergic contact  dermatitis due to metals 12/30/2022   Primary hypertension 03/09/2022   Osteoarthritis of right knee 08/27/2021   S/P total knee arthroplasty, 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   COVID-19 virus infection 04/09/2021   Dyslipidemia 03/31/2021   Urinary frequency 10/29/2017   Diabetes mellitus without complication (HCC) 09/14/2014   Special screening for malignant neoplasms, colon 11/25/2011   Diverticulosis of colon (without mention of hemorrhage) 11/25/2011   URTICARIA 01/31/2010   ECZEMA 05/03/2009    PCP: Nelwyn Salisbury, MD   REFERRING PROVIDER: Julieanne Cotton, PA-C   REFERRING DIAG: [phone] (ICD-10-CM) - S/P revision of total knee, right  THERAPY DIAG:  Difficulty in walking, not elsewhere classified  Stiffness of right knee, not elsewhere classified  Acute pain of right knee  Localized edema  Rationale for Evaluation and Treatment: Rehabilitation  ONSET DATE: 02/02/23  SUBJECTIVE:   SUBJECTIVE STATEMENT: Pt indicated feeling a little complaints this morning but nothing major.  Reported some more swelling at times after standing/walking activity.   PERTINENT HISTORY: OA, DM, HTN, initial Rt TKA 08/27/2021, closed reduction 11/20/21, Rt shoulder surgery    PAIN:  NPRS scale:  up to 5/10 Pain location: Rt knee pain Pain description: achy, quick pain at times random Aggravating factors: being on my feet, walking Relieving factors: resting, ice, elevating  PRECAUTIONS: None  WEIGHT  BEARING RESTRICTIONS: No  FALLS:  Has patient fallen in last 6 months? No  LIVING ENVIRONMENT: Lives with: lives with their family and lives with their spouse Lives in: House/apartment Stairs: Yes: External: 5 steps; can reach both Has following equipment at home: st cane  OCCUPATION: works at [employer]  PLOF: Independent  PATIENT GOALS: get back to work  Next MD visit: Dr. August Saucer 03/17/23   OBJECTIVE:   DIAGNOSTIC FINDINGS: Result Narrative  AP, lateral views of right knee reviewed.   Right total knee revision implants in good position and alignment without any complicating features.  There is no evidence of prosthetic fracture, dislocation, loosening.  PATIENT SURVEYS:  03/30/23: FOTO update 63  02/24/23: FOTO intake:  32   COGNITION: 02/24/2023 Overall cognitive status: WFL    SENSATION: 02/24/2023 WFL  EDEMA:  02/24/23: Rt Circumferential: 37.25 centimeters               : Left Circumferential: 34.75 centimeters  MUSCLE LENGTH: 02/24/23: Hamstrings: Right 75 deg; Left 78 deg   LOWER EXTREMITY ROM:   ROM 02/24/23 Rt  02/24/23 Left Right 03/04/23 Rt 03/30/23 Rt 04/05/23  Knee flexion A: 88 P: 95 A: 130 A: 105 AA: 108 A: 110 A: 110  Knee extension A: 10 P: -6 A: 0 A: -2 (LAQ) P: 0 A: 0 A: 0   (Blank rows = not tested)  LOWER EXTREMITY MMT:  MMT Right 02/24/23 Left 02/24/23 Right 04/01/2023  Hip flexion 4 4 5/5  Hip extension     Hip abduction 5 5   Hip adduction 5 5   Hip internal rotation     Hip external rotation     Knee flexion 3 5 5/5  Knee extension 3 5 4+/5  Ankle dorsiflexion     Ankle plantarflexion     Ankle inversion     Ankle eversion      (Blank rows = not tested)  FUNCTIONAL TESTS:  04/01/2023:   5 x sit to stand: 9.5 seconds  no UE assist  02/24/23: 5 time sit to stand: 15 seconds UE support  GAIT: 04/01/2023:  Independent ambulation most of the time.  Carries cane for walking exercise just in case.   02/24/2023 Distance walked: 30 feet level surface Assistive device utilized: Single point cane Level of assistance: Modified independence Comments: mild antalgic gait with increased knee flexion and hip flexion on the Rt.                                                                                                                                                                         TODAY'S TREATMENT  DATE:  04/05/2023 Therex: Scifit: level 4 x 6  minutes  Sit to stand to sit no UE assist 18 inch chair x 6 fast (testing) Leg press double leg 100 lbs x 15,  Rt leg only 50 lbs  2x15 Side stepping c Level 2 band around knees 30 feet x 4  Forward lunge x 10 alternating Leg extension machine: 10 lbs 3 x 10 bil LE's extension and Rt LE eccentric lowering  Neuro Re-ed Vectors: sliding disc cones placed ant/lat and post/lat x 15 c single UE support Up and down 1 flight of stairs in clinic c single hand rail x 2   Modalites:  Vasopneumatic device: 34 deg, x 10 minutes, moderate compression, bil LE elevated    04/01/2023 Therex: Nustep: level 5 x 10 minutes  Sit to stand to sit no UE assist 18 inch chair x 6 fast (testing) Leg press double leg 100 lbs x 15,  Rt leg only 50 lbs  2x15 Step up forward 6 inch step WB on Rt leg x 15 no UE assist Lateral step up/down 4 inch step WB on Rt leg x 15 no UE assist Leg extension machine: 10 lbs 3 x 10 bil LE's extension and Rt LE eccentric lowering  Neuro Re-ed SLS c contralateral leg tapping 3 cones anteriorly x 8 each, bilaterally Tandem stance on foam 1 min x 1 bilateral with occasional HHA on bar  Modalites:  Vasopneumatic device: 34 deg, x 10 minutes, moderate compression, bil LE elevated  TODAY'S TREATMENT                                                                        DATE: 03/30/2023 Therex: Nustep: level 5 x 10 minutes while answering FOTO questions Leg press bil 93 lbs 2x10,  Rt 50 lbs  2x10 Forward step ups with RLE onto  6" step x 15 reps each c No UE support Leg extension machine: 10# 2 x 10 bil LE's extension and Rt LE eccentric lowering Leg Curls machine: 10# Rt LE only 2 x 10 Neuro Re-ed Stepping over 6 inch hurdles (6 hurdles) x 4  Side stepping over 6 inch hurdles (6 hurdles) x 2 each direction Tapping cones placed in front of pt's foot alternating x 30  Modalites:  Vasopneumatic device: 34 deg, x 10 minutes, moderate compression, bil LE elevated      PATIENT  EDUCATION:  02/24/2023 Education details: HEP, POC Person educated: Patient Education method: Programmer, multimedia, Facilities manager, Verbal cues, and Handouts Education comprehension: verbalized understanding, returned demonstration, and verbal cues required  HOME EXERCISE PROGRAM: Access Code: [email or web address] URL: [email or web address] Date: 02/24/2023 Prepared by: Narda Amber  Exercises - Supine Quad Set on Towel Roll  - 3 x daily - 7 x weekly - 10-15 reps - 5 seconds hold - Supine Heel Slide with Strap  - 3 x daily - 7 x weekly - 10-15 reps - 5 seconds hold - Hooklying Straight Leg Raise  - 3 x daily - 7 x weekly - 10 reps - Seated Knee Flexion AAROM  - 3 x daily - 7 x weekly - 10 reps - 10 seconds hold - Sit to Stand with Counter Support  - 3 x daily - 7 x weekly -  10 reps  ASSESSMENT:  CLINICAL IMPRESSION: Pt able to demonstrate stair navigation using single rail today. Pt continuing to work on Rt LE strengthening for functional mobility and gait. Recommend skilled PT to maximize pt's function.      OBJECTIVE IMPAIRMENTS: decreased balance, decreased mobility, difficulty walking, decreased ROM, decreased strength, increased edema, impaired flexibility, and pain.   ACTIVITY LIMITATIONS: bending, sitting, standing, squatting, sleeping, stairs, transfers, and bed mobility  PARTICIPATION LIMITATIONS: cleaning, laundry, driving, and community activity  PERSONAL FACTORS: 3+ comorbidities: see pertinent history above  are also affecting patient's functional outcome.   REHAB POTENTIAL: Good  CLINICAL DECISION MAKING: Stable/uncomplicated  EVALUATION COMPLEXITY: Low   GOALS: Goals reviewed with patient? Yes  SHORT TERM GOALS: (target date for Short term goals are 3 weeks 03/19/23)   1.  Patient will demonstrate independent use of home exercise program to maintain progress from in clinic treatments.  Goal status: Met  LONG TERM GOALS: (target dates for all long term goals  are 10 weeks  05/07/23 )   1. Patient will demonstrate/report pain at worst less than or equal to 2/10 to facilitate minimal limitation in daily activity secondary to pain symptoms.  Goal status: on going 04/01/2023   2. Patient will demonstrate independent use of home exercise program to facilitate ability to maintain/progress functional gains from skilled physical therapy services.  Goal status:  on going 04/01/2023   3. Patient will demonstrate FOTO outcome > or = 55 % to indicate reduced disability due to condition.  Goal status:  Met 04/01/2023   4.  Patient will demonstrate Rt LE MMT 5/5 throughout to faciltiate usual transfers, stairs, squatting at [facility] for daily life.   Goal status:  on going 04/01/2023   5.  Patient will demonstrate up and down curb step with LRAD safely for community navigation.  Goal status:  Met 04/01/2023   6.  Pt will be able to donn her socks independently c Rt knee flexion.  Goal status:  Met 04/01/2023  7. Pt will be able to lift 15 # from floor to counter height safely with pain </= 2/10 in her Rt knee.   Goal Status:  on going 04/01/2023  PLAN:  PT FREQUENCY: 2-3x/week  PT DURATION: 10 weeks  PLANNED INTERVENTIONS: Therapeutic exercises, Therapeutic activity, Neuro Muscular re-education, Balance training, Gait training, Patient/Family education, Joint mobilization, Stair training, DME instructions, Dry Needling, Electrical stimulation, Traction, Cryotherapy, vasopneumatic deviceMoist heat, Taping, Ultrasound, Ionotophoresis 4mg /ml Dexamethasone, and aquatic therapy, Manual therapy.  All included unless contraindicated  PLAN FOR NEXT SESSION: Progressive strengthening, stair navigation improvements.  Possible HEP transitioning soon pending patient confidence in plan.   Narda Amber, PT, MPT 04/05/23 9:21 AM    PHYSICAL THERAPY DISCHARGE SUMMARY  Visits from Start of Care: 8  Current functional level related to goals / functional  outcomes: See note   Remaining deficits: See note   Education / Equipment: HEP  Patient goals were partially met. Patient is being discharged due to not returning since the last visit.  Chyrel Masson, PT, DPT, OCS, ATC 05/19/23  3:55 PM

## 2023-04-07 ENCOUNTER — Encounter: Payer: 59 | Admitting: Physical Therapy

## 2023-04-08 ENCOUNTER — Telehealth: Payer: 59 | Admitting: Family Medicine

## 2023-04-08 ENCOUNTER — Encounter: Payer: Self-pay | Admitting: Family Medicine

## 2023-04-08 ENCOUNTER — Telehealth: Payer: Self-pay | Admitting: Family Medicine

## 2023-04-08 VITALS — Ht 61.0 in | Wt 140.0 lb

## 2023-04-08 DIAGNOSIS — J019 Acute sinusitis, unspecified: Secondary | ICD-10-CM | POA: Diagnosis not present

## 2023-04-08 MED ORDER — BENZONATATE 200 MG PO CAPS: 200.0000 mg | ORAL_CAPSULE | Freq: Four times a day (QID) | ORAL | 0 refills | Status: DC | PRN

## 2023-04-08 MED ORDER — BENZONATATE 200 MG PO CAPS: 200.0000 mg | ORAL_CAPSULE | Freq: Three times a day (TID) | ORAL | 0 refills | Status: DC | PRN

## 2023-04-08 MED ORDER — DOXYCYCLINE HYCLATE 100 MG PO CAPS: 100.0000 mg | ORAL_CAPSULE | Freq: Two times a day (BID) | ORAL | 0 refills | Status: AC

## 2023-04-08 NOTE — Telephone Encounter (Signed)
[facility] with [facility] is calling and maximum benzonatate (TESSALON) 200 MG capsule  is 3 per day . Please clarify

## 2023-04-08 NOTE — Addendum Note (Signed)
Addended by: Christy Sartorius on: 04/08/2023 11:44 AM   Modules accepted: Orders

## 2023-04-08 NOTE — Telephone Encounter (Signed)
Rx sent with updated directions per verbal ok from PCP.

## 2023-04-08 NOTE — Progress Notes (Signed)
Subjective:    Patient ID: [name] [name], female    DOB: 1956-01-14, 67 y.o.   MRN: [id number]  HPI Virtual Visit via Video Note  I connected with the patient on 04/08/23 at  8:30 AM EDT by a video enabled telemedicine application and verified that I am speaking with the correct person using two identifiers.  Location patient: home Location provider:work or home office Persons participating in the virtual visit: patient, provider  I discussed the limitations of evaluation and management by telemedicine and the availability of in person appointments. The patient expressed understanding and agreed to proceed.   HPI: Here for a lingering cough. We saw her on 03-16-23 for a Covid infection, and she took 5 days of Paxlovid. Most of her symptoms have resolved, but she still has sinus congestion, PND, and a cough producing yellow sputum. No fever or SOB.    ROS: See pertinent positives and negatives per HPI.  Past Medical History:  Diagnosis Date   Arthritis    Complication of anesthesia    Diabetes mellitus without complication (HCC)    Gallstones    Hypertension    PONV (postoperative nausea and vomiting)    Ruptured ovarian cyst     Past Surgical History:  Procedure Laterality Date   CESAREAN SECTION     COLONOSCOPY  06/05/2019   per Dr. Orvan Falconer, single adenomatous polyp, repeat in 7 yrs   KNEE ARTHROPLASTY Right 08/27/2021   Procedure: COMPUTER ASSISTED TOTAL KNEE ARTHROPLASTY;  Surgeon: Samson Frederic, MD;  Location: [facility];  Service: Orthopedics;  Laterality: Right;   KNEE CLOSED REDUCTION Right 11/20/2021   Procedure: CLOSED MANIPULATION KNEE;  Surgeon: Samson Frederic, MD;  Location: [facility];  Service: Orthopedics;  Laterality: Right;   lumpectomy,benign, right breast     x2   RIGHT SHOULDER SURGERY      TOTAL KNEE REVISION Right 02/02/2023   Procedure: REVISION RIGHT TOTAL KNEE ARTHROPLASTY;  Surgeon: Cammy Copa, MD;  Location: [facility] OR;  Service: Orthopedics;   Laterality: Right;    Family History  Problem Relation Age of Onset   Cancer Other        breast cancer   Colon cancer Neg Hx    Esophageal cancer Neg Hx    Stomach cancer Neg Hx    Rectal cancer Neg Hx      Current Outpatient Medications:    BAYER LOW DOSE 81 MG chewable tablet, CHEW 1 TABLET (81 MG TOTAL) BY MOUTH 2 (TWO) TIMES DAILY., Disp: 60 tablet, Rfl: 0   losartan (COZAAR) 50 MG tablet, TAKE ONE TABLET BY MOUTH ONE TIME DAILY, Disp: 90 tablet, Rfl: 0   metFORMIN (GLUCOPHAGE) 1000 MG tablet, Take 0.5 tablets (500 mg total) by mouth 2 (two) times daily with a meal., Disp: 180 tablet, Rfl: 3   methocarbamol (ROBAXIN) 500 MG tablet, Take 1 tablet (500 mg total) by mouth every 8 (eight) hours as needed for muscle spasms., Disp: 30 tablet, Rfl: 0   Omega-3 Fatty Acids (FISH OIL PO), Take 1 capsule by mouth daily., Disp: , Rfl:    oxyCODONE (OXY IR/ROXICODONE) 5 MG immediate release tablet, Take 1 tablet (5 mg total) by mouth every 4 (four) hours as needed for moderate pain (pain score 4-6)., Disp: 30 tablet, Rfl: 0   sitaGLIPtin (JANUVIA) 50 MG tablet, Take 1 tablet (50 mg total) by mouth daily., Disp: 30 tablet, Rfl: 2  EXAM:  VITALS per patient if applicable:  GENERAL: alert, oriented, appears well and in  no acute distress  HEENT: atraumatic, conjunttiva clear, no obvious abnormalities on inspection of external nose and ears  NECK: normal movements of the head and neck  LUNGS: on inspection no signs of respiratory distress, breathing rate appears normal, no obvious gross SOB, gasping or wheezing  CV: no obvious cyanosis  MS: moves all visible extremities without noticeable abnormality  PSYCH/NEURO: pleasant and cooperative, no obvious depression or anxiety, speech and thought processing grossly intact  ASSESSMENT AND PLAN: Possible sinusitis secondary to a Covid infection. Treat with 10 days of Doxycycline and use Benzonatate for the cough. Gershon Crane, MD  Discussed  the following assessment and plan:  No diagnosis found.     I discussed the assessment and treatment plan with the patient. The patient was provided an opportunity to ask questions and all were answered. The patient agreed with the plan and demonstrated an understanding of the instructions.   The patient was advised to call back or seek an in-person evaluation if the symptoms worsen or if the condition fails to improve as anticipated.      Review of Systems     Objective:   Physical Exam        Assessment & Plan:

## 2023-04-15 ENCOUNTER — Telehealth: Payer: Self-pay | Admitting: Physical Medicine and Rehabilitation

## 2023-04-15 NOTE — Telephone Encounter (Signed)
Patient called needing to schedule an appointment with Dr. Alvester Morin. The number to contact Patient is [phone]

## 2023-04-16 NOTE — Telephone Encounter (Signed)
Spoke with patient and scheduled NCV for 04/20/23.

## 2023-04-19 ENCOUNTER — Ambulatory Visit (INDEPENDENT_AMBULATORY_CARE_PROVIDER_SITE_OTHER): Payer: 59 | Admitting: Family Medicine

## 2023-04-19 ENCOUNTER — Other Ambulatory Visit: Payer: Self-pay

## 2023-04-19 ENCOUNTER — Encounter: Payer: Self-pay | Admitting: Family Medicine

## 2023-04-19 VITALS — BP 124/78 | HR 72 | Temp 98.4°F | Ht 61.0 in | Wt 143.8 lb

## 2023-04-19 DIAGNOSIS — Z0000 Encounter for general adult medical examination without abnormal findings: Secondary | ICD-10-CM | POA: Diagnosis not present

## 2023-04-19 LAB — CBC WITH DIFFERENTIAL/PLATELET
Basophils Absolute: 0.1 10*3/uL (ref 0.0–0.1)
Basophils Relative: 1 % (ref 0.0–3.0)
Eosinophils Absolute: 0.2 10*3/uL (ref 0.0–0.7)
Eosinophils Relative: 2.5 % (ref 0.0–5.0)
HCT: 37.1 % (ref 36.0–46.0)
Hemoglobin: 11.7 g/dL — ABNORMAL LOW (ref 12.0–15.0)
Lymphocytes Relative: 31.2 % (ref 12.0–46.0)
Lymphs Abs: 2.2 10*3/uL (ref 0.7–4.0)
MCHC: 31.5 g/dL (ref 30.0–36.0)
MCV: 82.6 fl (ref 78.0–100.0)
Monocytes Absolute: 0.5 10*3/uL (ref 0.1–1.0)
Monocytes Relative: 6.4 % (ref 3.0–12.0)
Neutro Abs: 4.2 10*3/uL (ref 1.4–7.7)
Neutrophils Relative %: 58.9 % (ref 43.0–77.0)
Platelets: 298 10*3/uL (ref 150.0–400.0)
RBC: 4.49 Mil/uL (ref 3.87–5.11)
RDW: 15.3 % (ref 11.5–15.5)
WBC: 7.1 10*3/uL (ref 4.0–10.5)

## 2023-04-19 LAB — BASIC METABOLIC PANEL
BUN: 15 mg/dL (ref 6–23)
CO2: 29 mEq/L (ref 19–32)
Calcium: 9.3 mg/dL (ref 8.4–10.5)
Chloride: 100 mEq/L (ref 96–112)
Creatinine, Ser: 0.55 mg/dL (ref 0.40–1.20)
GFR: 96.15 mL/min (ref >60.00)
Glucose, Bld: 104 mg/dL — ABNORMAL HIGH (ref 70–99)
Potassium: 3.9 mEq/L (ref 3.5–5.1)
Sodium: 136 mEq/L (ref 135–145)

## 2023-04-19 LAB — TSH: TSH: 1 u[IU]/mL (ref 0.35–5.50)

## 2023-04-19 LAB — HEPATIC FUNCTION PANEL
ALT: 9 U/L (ref 0–35)
AST: 18 U/L (ref 0–37)
Albumin: 4 g/dL (ref 3.5–5.2)
Alkaline Phosphatase: 126 U/L — ABNORMAL HIGH (ref 39–117)
Bilirubin, Direct: 0 mg/dL (ref 0.0–0.3)
Total Bilirubin: 0.3 mg/dL (ref 0.2–1.2)
Total Protein: 7.6 g/dL (ref 6.0–8.3)

## 2023-04-19 LAB — LIPID PANEL
Cholesterol: 184 mg/dL (ref 0–200)
HDL: 49.5 mg/dL (ref >39.00)
LDL Cholesterol: 106 mg/dL — ABNORMAL HIGH (ref 0–99)
NonHDL: 134.45
Total CHOL/HDL Ratio: 4
Triglycerides: 140 mg/dL (ref 0.0–149.0)
VLDL: 28 mg/dL (ref 0.0–40.0)

## 2023-04-19 LAB — HEMOGLOBIN A1C: Hgb A1c MFr Bld: 7.1 % — ABNORMAL HIGH (ref 4.6–6.5)

## 2023-04-19 MED ORDER — TRIAMCINOLONE ACETONIDE 0.1 % EX CREA: 1.0000 | TOPICAL_CREAM | Freq: Two times a day (BID) | CUTANEOUS | 5 refills | Status: AC

## 2023-04-19 NOTE — Progress Notes (Signed)
   Subjective:    Patient ID: [name] [name], female    DOB: Jan 19, 1956, 67 y.o.   MRN: [id number]  HPI Here for a well exam. She is doing well, although she still has a dry cough from a recent Covid infection in July.    Review of Systems  Constitutional: Negative.   HENT: Negative.    Eyes: Negative.   Respiratory:  Positive for cough. Negative for shortness of breath and wheezing.   Cardiovascular: Negative.   Gastrointestinal: Negative.   Genitourinary:  Negative for decreased urine volume, difficulty urinating, dyspareunia, dysuria, enuresis, flank pain, frequency, hematuria, pelvic pain and urgency.  Musculoskeletal: Negative.   Skin: Negative.   Neurological: Negative.  Negative for headaches.  Psychiatric/Behavioral: Negative.         Objective:   Physical Exam Constitutional:      General: She is not in acute distress.    Appearance: Normal appearance. She is well-developed.  HENT:     Head: Normocephalic and atraumatic.     Right Ear: External ear normal.     Left Ear: External ear normal.     Nose: Nose normal.     Mouth/Throat:     Pharynx: No oropharyngeal exudate.  Eyes:     General: No scleral icterus.    Conjunctiva/sclera: Conjunctivae normal.     Pupils: Pupils are equal, round, and reactive to light.  Neck:     Thyroid: No thyromegaly.     Vascular: No JVD.  Cardiovascular:     Rate and Rhythm: Normal rate and regular rhythm.     Pulses: Normal pulses.     Heart sounds: Normal heart sounds. No murmur heard.    No friction rub. No gallop.  Pulmonary:     Effort: Pulmonary effort is normal. No respiratory distress.     Breath sounds: Normal breath sounds. No wheezing or rales.  Chest:     Chest wall: No tenderness.  Abdominal:     General: Bowel sounds are normal. There is no distension.     Palpations: Abdomen is soft. There is no mass.     Tenderness: There is no abdominal tenderness. There is no guarding or rebound.  Musculoskeletal:         General: No tenderness. Normal range of motion.     Cervical back: Normal range of motion and neck supple.  Lymphadenopathy:     Cervical: No cervical adenopathy.  Skin:    General: Skin is warm and dry.     Findings: No erythema or rash.  Neurological:     General: No focal deficit present.     Mental Status: She is alert and oriented to person, place, and time.     Cranial Nerves: No cranial nerve deficit.     Motor: No abnormal muscle tone.     Coordination: Coordination normal.     Deep Tendon Reflexes: Reflexes are normal and symmetric. Reflexes normal.  Psychiatric:        Mood and Affect: Mood normal.        Behavior: Behavior normal.        Thought Content: Thought content normal.        Judgment: Judgment normal.           Assessment & Plan:  Well exam. We discussed diet and exercise. Get fasting labs. Gershon Crane, MD

## 2023-04-20 ENCOUNTER — Ambulatory Visit: Payer: 59 | Admitting: Physical Medicine and Rehabilitation

## 2023-04-20 DIAGNOSIS — R531 Weakness: Secondary | ICD-10-CM | POA: Diagnosis not present

## 2023-04-20 DIAGNOSIS — R202 Paresthesia of skin: Secondary | ICD-10-CM

## 2023-04-20 DIAGNOSIS — M25532 Pain in left wrist: Secondary | ICD-10-CM

## 2023-04-20 DIAGNOSIS — M25531 Pain in right wrist: Secondary | ICD-10-CM | POA: Diagnosis not present

## 2023-04-20 DIAGNOSIS — M79641 Pain in right hand: Secondary | ICD-10-CM | POA: Diagnosis not present

## 2023-04-20 DIAGNOSIS — M79642 Pain in left hand: Secondary | ICD-10-CM

## 2023-04-20 NOTE — Progress Notes (Signed)
Functional Pain Scale - descriptive words and definitions  Moderate (4)   Constantly aware of pain, can complete ADLs with modification/sleep marginally affected at times/passive distraction is of no use, but active distraction gives some relief. Moderate range order  Average Pain  varies  Right handed. Bilateral hand pain and numbness in middle and ring fingers. Hard to hold on to things

## 2023-04-20 NOTE — Progress Notes (Signed)
[name] [name] - 67 y.o. female MRN [id number]  Date of birth: [birth year]  Office Visit Note: Visit Date: 04/20/2023 PCP: Nelwyn Salisbury, MD Referred by: Cammy Copa, MD  Subjective: Chief Complaint  Patient presents with   Right Hand - Pain, Numbness   Left Hand - Pain, Numbness   HPI:  [name] [name] is a 67 y.o. female who comes in today at the request of Dr. Burnard Bunting for evaluation and management of chronic, worsening and severe pain, numbness and tingling in the Bilateral upper extremities.  Patient is Right hand dominant.  She reports years of age Bilateral right more than left hand pain with paresthesias particularly in the middle digits somewhat more median nerve distribution versus C7 distribution.  She does endorse history of neck pain and multiple orthopedic pain complaints in general.  She tells me that several years ago she was treated for known carpal tunnel syndrome on the right.  It appears looking at the notes she saw Dr. Gilman Schmidt at [facility].  I could not see any electrodiagnostic studies done at that time.  She did not have surgery.  This goes as far back as 2019 on the notes.  She reports a lot of wrist pain and some loss of dexterity and feels like she is dropping objects.  She reports that she did have an injection at 1 time into the carpal tunnel that did give her some relief.  Her history is complicated by diabetes.  Her last hemoglobin A1c was 7.1.   I spent more than 30 minutes speaking face-to-face with the patient with 50% of the time in counseling and discussing coordination of care.    Review of Systems  Musculoskeletal:  Positive for joint pain.  Neurological:  Positive for tingling and focal weakness.  All other systems reviewed and are negative.  Otherwise per HPI.  Assessment & Plan: Visit Diagnoses:    ICD-10-CM   1. Paresthesia of skin  R20.2 NCV with EMG (electromyography)    2. Bilateral hand pain  M79.641    M79.642     3.  Bilateral wrist pain  M25.531    M25.532     4. Weakness  R53.1       Plan: Impression: Differential diagnosis for bilateral hand pain with paresthesia includes osteoarthritis and carpal tunnel syndrome as well as possible cervical radiculopathy.  Case complicated with diabetes.  Electrodiagnostic study performed today.  The above electrodiagnostic study is ABNORMAL and reveals evidence of: a moderate right median nerve entrapment at the wrist (carpal tunnel syndrome) affecting sensory and motor components.   a mild left median nerve entrapment at the wrist (carpal tunnel syndrome) affecting sensory components.  There is no significant electrodiagnostic evidence of any other focal nerve entrapment, brachial plexopathy or cervical radiculopathy.  Recommendations: 1.  Follow-up with referring physician. 2.  Continue current management of symptoms. 3.  Continue use of resting splint at night-time and as needed during the day. 4.  Suggest surgical evaluation.  Meds & Orders: No orders of the defined types were placed in this encounter.   Orders Placed This Encounter  Procedures   NCV with EMG (electromyography)    Follow-up: Return for  G. Dorene Grebe, MD.   Procedures: No procedures performed  EMG & NCV Findings: Evaluation of the right median motor nerve showed prolonged distal onset latency (4.4 ms), reduced amplitude (2.9 mV), and decreased conduction velocity (Elbow-Wrist, 49 m/s).  The left median (across palm) sensory nerve showed  prolonged distal peak latency (Wrist, 3.9 ms).  The right median (across palm) sensory nerve showed no response (Palm) and prolonged distal peak latency (4.4 ms).  All remaining nerves (as indicated in the following tables) were within normal limits.  Left vs. Right side comparison data for the median motor nerve indicates abnormal L-R amplitude difference (58.0 %).    All examined muscles (as indicated in the following table) showed no evidence of  electrical instability.    Impression: The above electrodiagnostic study is ABNORMAL and reveals evidence of: a moderate right median nerve entrapment at the wrist (carpal tunnel syndrome) affecting sensory and motor components.   a mild left median nerve entrapment at the wrist (carpal tunnel syndrome) affecting sensory components.  There is no significant electrodiagnostic evidence of any other focal nerve entrapment, brachial plexopathy or cervical radiculopathy.  Recommendations: 1.  Follow-up with referring physician. 2.  Continue current management of symptoms. 3.  Continue use of resting splint at night-time and as needed during the day. 4.  Suggest surgical evaluation.  ___________________________ Naaman Plummer FAAPMR Board Certified, American Board of Physical Medicine and Rehabilitation    Nerve Conduction Studies Anti Sensory Summary Table   Stim Site NR Peak (ms) Norm Peak (ms) P-T Amp (V) Norm P-T Amp Site1 Site2 Delta-P (ms) Dist (cm) Vel (m/s) Norm Vel (m/s)  Left Median Acr Palm Anti Sensory (2nd Digit)  30.8C  Wrist    *3.9 <3.6 26.8 >10 Wrist Palm 2.1 0.0    Palm    1.8 <2.0 9.5         Right Median Acr Palm Anti Sensory (2nd Digit)  31.1C  Wrist    *4.4 <3.6 17.0 >10 Wrist Palm  0.0    Palm *NR  <2.0          Right Radial Anti Sensory (Base 1st Digit)  31C  Wrist    2.2 <3.1 35.9  Wrist Base 1st Digit 2.2 0.0    Right Ulnar Anti Sensory (5th Digit)  31.2C  Wrist    3.1 <3.7 25.0 >15.0 Wrist 5th Digit 3.1 14.0 45 >38   Motor Summary Table   Stim Site NR Onset (ms) Norm Onset (ms) O-P Amp (mV) Norm O-P Amp Site1 Site2 Delta-0 (ms) Dist (cm) Vel (m/s) Norm Vel (m/s)  Left Median Motor (Abd Poll Brev)  31.1C  Wrist    4.1 <4.2 6.9 >5 Elbow Wrist 3.6 19.0 53 >50  Elbow    7.7  5.2         Right Median Motor (Abd Poll Brev)  31.1C  Wrist    *4.4 <4.2 *2.9 >5 Elbow Wrist 3.9 19.0 *49 >50  Elbow    8.3  7.9         Right Ulnar Motor (Abd Dig Min)  31.2C   Wrist    2.7 <4.2 12.3 >3 B Elbow Wrist 3.0 17.5 58 >53  B Elbow    5.7  12.3  A Elbow B Elbow 0.9 10.0 111 >53  A Elbow    6.6  12.2          EMG   Side Muscle Nerve Root Ins Act Fibs Psw Amp Dur Poly Recrt Int Dennie Bible Comment  Right Abd Poll Brev Median C8-T1 Nml Nml Nml Nml Nml 0 Nml Nml   Right 1stDorInt Ulnar C8-T1 Nml Nml Nml Nml Nml 0 Nml Nml   Right PronatorTeres Median C6-7 Nml Nml Nml Nml Nml 0 Nml Nml   Right Biceps Musculocut  C5-6 Nml Nml Nml Nml Nml 0 Nml Nml   Right Deltoid Axillary C5-6 Nml Nml Nml Nml Nml 0 Nml Nml     Nerve Conduction Studies Anti Sensory Left/Right Comparison   Stim Site L Lat (ms) R Lat (ms) L-R Lat (ms) L Amp (V) R Amp (V) L-R Amp (%) Site1 Site2 L Vel (m/s) R Vel (m/s) L-R Vel (m/s)  Median Acr Palm Anti Sensory (2nd Digit)  30.8C  Wrist *3.9 *4.4 0.5 26.8 17.0 36.6 Wrist Palm     Palm 1.8   9.5         Radial Anti Sensory (Base 1st Digit)  31C  Wrist  2.2   35.9  Wrist Base 1st Digit     Ulnar Anti Sensory (5th Digit)  31.2C  Wrist  3.1   25.0  Wrist 5th Digit  45    Motor Left/Right Comparison   Stim Site L Lat (ms) R Lat (ms) L-R Lat (ms) L Amp (mV) R Amp (mV) L-R Amp (%) Site1 Site2 L Vel (m/s) R Vel (m/s) L-R Vel (m/s)  Median Motor (Abd Poll Brev)  31.1C  Wrist 4.1 *4.4 0.3 6.9 *2.9 *58.0 Elbow Wrist 53 *49 4  Elbow 7.7 8.3 0.6 5.2 7.9 34.2       Ulnar Motor (Abd Dig Min)  31.2C  Wrist  2.7   12.3  B Elbow Wrist  58   B Elbow  5.7   12.3  A Elbow B Elbow  111   A Elbow  6.6   12.2           Waveforms:                 Clinical History: No specialty comments available.     Objective:  VS:  HT:    WT:   BMI:     BP:   HR: bpm  TEMP: ( )  RESP:  Physical Exam Vitals and nursing note reviewed.  Constitutional:      General: She is not in acute distress.    Appearance: Normal appearance. She is well-developed. She is not ill-appearing.  HENT:     Head: Normocephalic and atraumatic.  Eyes:      Conjunctiva/sclera: Conjunctivae normal.     Pupils: Pupils are equal, round, and reactive to light.  Cardiovascular:     Rate and Rhythm: Normal rate.     Pulses: Normal pulses.  Pulmonary:     Effort: Pulmonary effort is normal.  Musculoskeletal:        General: No swelling, tenderness or deformity.     Right lower leg: No edema.     Left lower leg: No edema.     Comments: Inspection reveals no atrophy of the bilateral APB or FDI or hand intrinsics. There is no swelling, color changes, allodynia or dystrophic changes. There is 5 out of 5 strength in the bilateral wrist extension, finger abduction and long finger flexion. There is intact sensation to light touch in all dermatomal and peripheral nerve distributions.  There is a negative Tinel's test at the bilateral wrist and elbow. There is a positive Phalen's test bilaterally. There is a negative Hoffmann's test bilaterally.  Skin:    General: Skin is warm and dry.     Findings: No erythema or rash.  Neurological:     General: No focal deficit present.     Mental Status: She is alert and oriented to person, place, and time.     Sensory:  No sensory deficit.     Motor: No weakness or abnormal muscle tone.     Coordination: Coordination normal.     Gait: Gait normal.  Psychiatric:        Mood and Affect: Mood normal.        Behavior: Behavior normal.      Imaging: No results found.

## 2023-04-22 ENCOUNTER — Ambulatory Visit: Payer: 59 | Admitting: Nurse Practitioner

## 2023-04-22 ENCOUNTER — Encounter: Payer: Self-pay | Admitting: Nurse Practitioner

## 2023-04-22 VITALS — BP 118/68 | HR 68 | Ht 61.0 in | Wt 145.0 lb

## 2023-04-22 DIAGNOSIS — K600 Acute anal fissure: Secondary | ICD-10-CM

## 2023-04-22 DIAGNOSIS — K625 Hemorrhage of anus and rectum: Secondary | ICD-10-CM | POA: Diagnosis not present

## 2023-04-22 MED ORDER — NON FORMULARY: 1 refills | Status: DC

## 2023-04-22 NOTE — Progress Notes (Signed)
04/22/2023 [name] [name] [id number] August 24, 1956   CHIEF COMPLAINT: Rectal bleeding  HISTORY OF PRESENT ILLNESS: [name] [name] is a 67 year old female with a past medical history of arthritis, hypertension, diabetes mellitus type 2, gallstones and colon polyps. Previously known by Dr. Orvan Falconer. She presents today for further evaluation regarding anorectal pain with bleeding. She was having on and off LLQ pain 1 month ago which abated. She has anorectal pain only when passing a stool. She is passing two formed stools daily, recently started to strain to pass a BM. She had constipation, no BM for about 4 days, a few months ago following right total knee replacement surgery. She describe seeing a small amount of bright red blood on the stool and toilet tissue which occurs with most BMs. She went to the ED 02/22/2023 due to having rectal bleeding without abdominal pain. Labs in the ED showed a Hg level of 10.4. FOBT positive. Rectal exam showed internal hemorrhoids. She was discharged home with recommendations to take a fiber supplement and to schedule GI follow up appointment. She underwent a colonoscopy 06/05/2019 which showed one 3mm tubular adenomatous polyp removed from the distal descending colon and diverticulosis to the sigmoid colon. An EGD was done on the same date which showed mild chronic gastritis without evidence of H. Pylori. She had Covid in July.      Latest Ref Rng & Units 04/19/2023   11:05 AM 02/22/2023   11:27 AM 01/25/2023    9:30 AM  CBC  WBC 4.0 - 10.5 K/uL 7.1  6.6  7.5   Hemoglobin 12.0 - 15.0 g/dL 95.2  84.1  32.4   Hematocrit 36.0 - 46.0 % 37.1  33.0  38.0   Platelets 150.0 - 400.0 K/uL 298.0  399  367         Latest Ref Rng & Units 04/19/2023   11:05 AM 02/22/2023   11:27 AM 01/25/2023    9:30 AM  CMP  Glucose 70 - 99 mg/dL 401  027  253   BUN 6 - 23 mg/dL 15  16  13    Creatinine 0.40 - 1.20 mg/dL 6.64  4.03  4.74   Sodium 135 - 145 mEq/L 136  138  137   Potassium 3.5 -  5.1 mEq/L 3.9  4.0  3.7   Chloride 96 - 112 mEq/L 100  101  101   CO2 19 - 32 mEq/L 29  26  27    Calcium 8.4 - 10.5 mg/dL 9.3  8.7  8.8   Total Protein 6.0 - 8.3 g/dL 7.6  7.3    Total Bilirubin 0.2 - 1.2 mg/dL 0.3  0.4    Alkaline Phos 39 - 117 U/L 126  131    AST 0 - 37 U/L 18  21    ALT 0 - 35 U/L 9  11      EGD 06/05/2019 by Dr. Orvan Falconer: - Normal esophagus. No obvious cause of dysphagia identified on this study. Biopsied.  - Erythematous mucosa in the gastric body. Biopsied.  - Normal examined duodenum. Biopsied. -The examination was otherwise normal.  Colonoscopy 06/05/2019: - No source of abdominal pain identified on this examination.  - Diverticulosis in the sigmoid colon.  - One 3 mm polyp in the distal ascending colon, removed with a cold snare. Resected and retrieved.  - The examination was otherwise normal on direct and retroflexion views.  -7 year recal   1. Surgical [P], duodenal - DUODENAL MUCOSA WITH NO SIGNIFICANT PATHOLOGIC  CHANGES. - NO FEATURES OF SPRUE OR GRANULOMAS. 2. Surgical [P], gastric antrum and gastric body - ANTRAL MUCOSA WITH MILD CHRONIC GASTRITIS. - WARTHIN-STARRY NEGATIVE FOR HELICOBACTER PYLORI. - NO INTESTINAL METAPLASIA, DYSPLASIA OR MALIGNANCY. 3. Surgical [P], stomach, fundus - OXYNTIC MUCOSA WITH HYPEREMIA. - WARTHIN-STARRY NEGATIVE FOR HELICOBACTER PYLORI. - NO INTESTINAL METAPLASIA, DYSPLASIA OR MALIGNANCY. 4. Surgical [P], esophagus, GE junction - GASTROESOPHAGEAL MUCOSA WITH CHRONIC INFLAMMATION CONSISTENT WITH REFLUX. - PAS NEGATIVE FOR FUNGUS. - NO INTESTINAL METAPLASIA, DYSPLASIA OR MALIGNANCY. 5. Surgical [P], mid esophagus and distal esophagus - SQUAMOUS MUCOSA WITH NO SIGNIFICANT PATHOLOGIC CHANGES. - PAS NEGATIVE FOR FUNGUS - NO EOSINOPHILIC ESOPHAGITIS (LESS THAN 5 PER HIGH POWER FIELD). 6. Surgical [P], colon, ascending, polyp - TUBULAR ADENOMA(S). - NO HIGH GRADE DYSPLASIA OR MALIGNANCY.  Colonoscopy 04/26/2012 by Dr.  Jarold Motto: -Diverticulosis, mild, left-sided diverticulosis -No polyps or cancers -Otherwise normal examination  Past Medical History:  Diagnosis Date   Arthritis    Complication of anesthesia    Diabetes mellitus without complication (HCC)    Gallstones    Hypertension    PONV (postoperative nausea and vomiting)    Ruptured ovarian cyst    Past Surgical History:  Procedure Laterality Date   CESAREAN SECTION     COLONOSCOPY  06/05/2019   per Dr. Orvan Falconer, single adenomatous polyp, repeat in 7 yrs   KNEE ARTHROPLASTY Right 08/27/2021   Procedure: COMPUTER ASSISTED TOTAL KNEE ARTHROPLASTY;  Surgeon: Samson Frederic, MD;  Location: [facility];  Service: Orthopedics;  Laterality: Right;   KNEE CLOSED REDUCTION Right 11/20/2021   Procedure: CLOSED MANIPULATION KNEE;  Surgeon: Samson Frederic, MD;  Location: [facility];  Service: Orthopedics;  Laterality: Right;   lumpectomy,benign, right breast     x2   RIGHT SHOULDER SURGERY      TOTAL KNEE REVISION Right 02/02/2023   Procedure: REVISION RIGHT TOTAL KNEE ARTHROPLASTY;  Surgeon: Cammy Copa, MD;  Location: [facility] OR;  Service: Orthopedics;  Laterality: Right;   Social History: She has one son. She works in Personnel officer. Nonsmoker. No alcohol use. No drug use.   Family History:  No family history of esophageal, gastric or colon cancer.   Allergies  Allergen Reactions   Hydrocodone Bit-Homatrop Mbr Nausea And Vomiting      Outpatient Encounter Medications as of 04/22/2023  Medication Sig   BAYER LOW DOSE 81 MG chewable tablet CHEW 1 TABLET (81 MG TOTAL) BY MOUTH 2 (TWO) TIMES DAILY.   benzonatate (TESSALON) 200 MG capsule Take 1 capsule (200 mg total) by mouth 3 (three) times daily as needed for cough.   losartan (COZAAR) 50 MG tablet TAKE ONE TABLET BY MOUTH ONE TIME DAILY   metFORMIN (GLUCOPHAGE) 1000 MG tablet Take 0.5 tablets (500 mg total) by mouth 2 (two) times daily with a meal.   methocarbamol (ROBAXIN) 500 MG tablet Take 1  tablet (500 mg total) by mouth every 8 (eight) hours as needed for muscle spasms.   Omega-3 Fatty Acids (FISH OIL PO) Take 1 capsule by mouth daily.   oxyCODONE (OXY IR/ROXICODONE) 5 MG immediate release tablet Take 1 tablet (5 mg total) by mouth every 4 (four) hours as needed for moderate pain (pain score 4-6).   triamcinolone cream (KENALOG) 0.1 % Apply 1 Application topically 2 (two) times daily.   No facility-administered encounter medications on file as of 04/22/2023.    REVIEW OF SYSTEMS:  Gen: Denies fever, sweats or chills. No weight loss.  CV: Denies chest pain, palpitations or edema. Resp:  Denies cough, shortness of breath of hemoptysis.  GI: See HPI. No GERD symptoms.  GU: Denies urinary burning, blood in urine, increased urinary frequency or incontinence. MS: + Arthritis.  Derm: Denies rash, itchiness, skin lesions or unhealing ulcers. Psych: Denies depression, anxiety, memory loss or confusion. Heme: Denies bruising, easy bleeding. Neuro:  Denies headaches, dizziness or paresthesias. Endo:  + DM II.  PHYSICAL EXAM: BP 118/68   Pulse 68   Ht 5\' 1"  (1.549 m)   Wt 145 lb (65.8 kg)   BMI 27.40 kg/m   General: 67 year old female in no acute distress. Head: Normocephalic and atraumatic. Eyes:  Sclerae non-icteric, conjunctive pink. Ears: Normal auditory acuity. Mouth: Dentition intact. No ulcers or lesions.  Neck: Supple, no lymphadenopathy or thyromegaly.  Lungs: Clear bilaterally to auscultation without wheezes, crackles or rhonchi. Heart: Regular rate and rhythm. No murmur, rub or gallop appreciated.  Abdomen: Soft, nontender, nondistended. No masses. No hepatosplenomegaly. Normoactive bowel sounds x 4 quadrants.  Rectal: Posterior anal fissure, tender without active bleeding. Internal hemorrhoids without prolapse. Loysie CMA present during exam.  Musculoskeletal: Symmetrical with no gross deformities. Skin: Warm and dry. No rash or lesions on visible  extremities. Extremities: No edema. Neurological: Alert oriented x 4, no focal deficits.  Psychological:  Alert and cooperative. Normal mood and affect.  ASSESSMENT AND PLAN:  67 year old female with anorectal pain and bright red rectal bleeding, likely due to posterior anal fissure. -Diltiazem 2%/Lidocaine 5% ointment apply a small amount inside the anal opening and to the external anal area tid x 6 weeks  -Apply a small amount of Desitin inside the anal opening and to the external anal area three times daily as needed for anal or hemorrhoidal irritation/bleeding.  -Miralax Q HS as tolerated -Benefiber 1 tbsp every day to avoid straining  -Follow up in 6 weeks to reassess fissure and to discuss scheduling a colonoscopy   Normocytic anemia. Labs 04/19/2023 showed a Hg level of 11.7. MCV 82.6.  -CBC, IBC + ferritin in 4 weeks   Elevated alk phos level with normal total bili, AST and ALT levels. Labs 04/19/2023: Alk phos 126. T. Bili 0.3. AST 18. ALT 9.  -Hepatic panel, GGT in 4 weeks   History of colon polyps. Colonoscopy 06/05/2019 identified one 3mm TA polyp removed from the distal ascending colon.  -Next colon polyp surveillance colonoscopy due 05/2026, consider earlier diagnostic colonoscopy as noted above  Diverticulosis. LLQ pain 1 or 2 months ago, abated. -Patient to contact office if abdominal pain recurs  -Miralax and Benefiber as recommended above     CC:  Nelwyn Salisbury, MD

## 2023-04-22 NOTE — Procedures (Signed)
EMG & NCV Findings: Evaluation of the right median motor nerve showed prolonged distal onset latency (4.4 ms), reduced amplitude (2.9 mV), and decreased conduction velocity (Elbow-Wrist, 49 m/s).  The left median (across palm) sensory nerve showed prolonged distal peak latency (Wrist, 3.9 ms).  The right median (across palm) sensory nerve showed no response (Palm) and prolonged distal peak latency (4.4 ms).  All remaining nerves (as indicated in the following tables) were within normal limits.  Left vs. Right side comparison data for the median motor nerve indicates abnormal L-R amplitude difference (58.0 %).    All examined muscles (as indicated in the following table) showed no evidence of electrical instability.    Impression: The above electrodiagnostic study is ABNORMAL and reveals evidence of: a moderate right median nerve entrapment at the wrist (carpal tunnel syndrome) affecting sensory and motor components.   a mild left median nerve entrapment at the wrist (carpal tunnel syndrome) affecting sensory components.  There is no significant electrodiagnostic evidence of any other focal nerve entrapment, brachial plexopathy or cervical radiculopathy.  Recommendations: 1.  Follow-up with referring physician. 2.  Continue current management of symptoms. 3.  Continue use of resting splint at night-time and as needed during the day. 4.  Suggest surgical evaluation.  ___________________________ Naaman Plummer FAAPMR Board Certified, American Board of Physical Medicine and Rehabilitation    Nerve Conduction Studies Anti Sensory Summary Table   Stim Site NR Peak (ms) Norm Peak (ms) P-T Amp (V) Norm P-T Amp Site1 Site2 Delta-P (ms) Dist (cm) Vel (m/s) Norm Vel (m/s)  Left Median Acr Palm Anti Sensory (2nd Digit)  30.8C  Wrist    *3.9 <3.6 26.8 >10 Wrist Palm 2.1 0.0    Palm    1.8 <2.0 9.5         Right Median Acr Palm Anti Sensory (2nd Digit)  31.1C  Wrist    *4.4 <3.6 17.0 >10 Wrist  Palm  0.0    Palm *NR  <2.0          Right Radial Anti Sensory (Base 1st Digit)  31C  Wrist    2.2 <3.1 35.9  Wrist Base 1st Digit 2.2 0.0    Right Ulnar Anti Sensory (5th Digit)  31.2C  Wrist    3.1 <3.7 25.0 >15.0 Wrist 5th Digit 3.1 14.0 45 >38   Motor Summary Table   Stim Site NR Onset (ms) Norm Onset (ms) O-P Amp (mV) Norm O-P Amp Site1 Site2 Delta-0 (ms) Dist (cm) Vel (m/s) Norm Vel (m/s)  Left Median Motor (Abd Poll Brev)  31.1C  Wrist    4.1 <4.2 6.9 >5 Elbow Wrist 3.6 19.0 53 >50  Elbow    7.7  5.2         Right Median Motor (Abd Poll Brev)  31.1C  Wrist    *4.4 <4.2 *2.9 >5 Elbow Wrist 3.9 19.0 *49 >50  Elbow    8.3  7.9         Right Ulnar Motor (Abd Dig Min)  31.2C  Wrist    2.7 <4.2 12.3 >3 B Elbow Wrist 3.0 17.5 58 >53  B Elbow    5.7  12.3  A Elbow B Elbow 0.9 10.0 111 >53  A Elbow    6.6  12.2          EMG   Side Muscle Nerve Root Ins Act Fibs Psw Amp Dur Poly Recrt Int Dennie Bible Comment  Right Abd Poll Brev Median C8-T1 Nml Nml Nml Nml  Nml 0 Nml Nml   Right 1stDorInt Ulnar C8-T1 Nml Nml Nml Nml Nml 0 Nml Nml   Right PronatorTeres Median C6-7 Nml Nml Nml Nml Nml 0 Nml Nml   Right Biceps Musculocut C5-6 Nml Nml Nml Nml Nml 0 Nml Nml   Right Deltoid Axillary C5-6 Nml Nml Nml Nml Nml 0 Nml Nml     Nerve Conduction Studies Anti Sensory Left/Right Comparison   Stim Site L Lat (ms) R Lat (ms) L-R Lat (ms) L Amp (V) R Amp (V) L-R Amp (%) Site1 Site2 L Vel (m/s) R Vel (m/s) L-R Vel (m/s)  Median Acr Palm Anti Sensory (2nd Digit)  30.8C  Wrist *3.9 *4.4 0.5 26.8 17.0 36.6 Wrist Palm     Palm 1.8   9.5         Radial Anti Sensory (Base 1st Digit)  31C  Wrist  2.2   35.9  Wrist Base 1st Digit     Ulnar Anti Sensory (5th Digit)  31.2C  Wrist  3.1   25.0  Wrist 5th Digit  45    Motor Left/Right Comparison   Stim Site L Lat (ms) R Lat (ms) L-R Lat (ms) L Amp (mV) R Amp (mV) L-R Amp (%) Site1 Site2 L Vel (m/s) R Vel (m/s) L-R Vel (m/s)  Median Motor (Abd Poll Brev)   31.1C  Wrist 4.1 *4.4 0.3 6.9 *2.9 *58.0 Elbow Wrist 53 *49 4  Elbow 7.7 8.3 0.6 5.2 7.9 34.2       Ulnar Motor (Abd Dig Min)  31.2C  Wrist  2.7   12.3  B Elbow Wrist  58   B Elbow  5.7   12.3  A Elbow B Elbow  111   A Elbow  6.6   12.2           Waveforms:

## 2023-04-22 NOTE — Patient Instructions (Signed)
Take Miralax 1 capful mixed in 8 ounces of water at bed time for constipation as tolerated.  Benefiber 1 tablespoon once daily as tolerated to avoid   A prescription for Diltiazem/Lidocaine fissure ointment apply a small amount inside and to the external anal area three times daily x 6 weeks   You can also apply a small amount of Desitin inside the anal opening and to the external anal area three times daily as needed for anal or hemorrhoidal irritation/bleeding.   Follow up in or office in 6 to 8 weeks.  Your provider has prescribed Diltiazem gel for you. Please follow the directions written on your prescription bottle or given to you specifically by your provider. Since this is a specialty medication and is not readily available at most local pharmacies, we have sent your prescription to:  [facility] information is below: Address: [address] [place], [place] [address]  [phone]  *Please DO NOT go directly from our office to pick up this medication! Give the pharmacy 1 day to process the prescription as this is compounded and takes time to make.  Due to recent changes in healthcare laws, you may see the results of your imaging and laboratory studies on MyChart before your provider has had a chance to review them.  We understand that in some cases there may be results that are confusing or concerning to you. Not all laboratory results come back in the same time frame and the provider may be waiting for multiple results in order to interpret others.  Please give [place] 48 hours in order for your provider to thoroughly review all the results before contacting the office for clarification of your results.   Thank you for trusting me with your gastrointestinal care!   Alcide Evener, CRNP

## 2023-04-23 ENCOUNTER — Other Ambulatory Visit: Payer: Self-pay

## 2023-04-23 ENCOUNTER — Telehealth: Payer: Self-pay

## 2023-04-23 DIAGNOSIS — R748 Abnormal levels of other serum enzymes: Secondary | ICD-10-CM

## 2023-04-23 DIAGNOSIS — D649 Anemia, unspecified: Secondary | ICD-10-CM

## 2023-04-23 NOTE — Telephone Encounter (Signed)
Labs ordered. Pt notified & advised on when/where to go for labs. Verbalized all understanding.

## 2023-04-23 NOTE — Telephone Encounter (Signed)
[name] Natal, NP  [name], [name] Dike, DO; [name], [name], RN Dr. Barron Alvine, pls verify if you recommend a colonoscopy in a few months due to rectal bleeding in setting of a posterior anal fissure and hx of a small TA polyps per colonoscopy 05/2019. Pls verify timing of next colonoscopy. THX.  [name], pls enter a lab order for a CBC, IBC + ferritin, B12, hepatic panel and GGT to be done in 1 month and inform patient to do these labs. DX: Anemia, elevated Alk phos level. THX.

## 2023-04-26 ENCOUNTER — Encounter: Payer: Self-pay | Admitting: Physical Medicine and Rehabilitation

## 2023-05-06 ENCOUNTER — Ambulatory Visit (INDEPENDENT_AMBULATORY_CARE_PROVIDER_SITE_OTHER): Payer: 59 | Admitting: Orthopedic Surgery

## 2023-05-06 ENCOUNTER — Encounter: Payer: Self-pay | Admitting: Orthopedic Surgery

## 2023-05-06 DIAGNOSIS — R202 Paresthesia of skin: Secondary | ICD-10-CM

## 2023-05-06 MED ORDER — AMOXICILLIN 500 MG PO TABS: ORAL_TABLET | ORAL | 0 refills | Status: DC

## 2023-05-06 NOTE — Addendum Note (Signed)
Addended byPrescott Parma on: 05/06/2023 08:56 AM   Modules accepted: Orders

## 2023-05-06 NOTE — Progress Notes (Signed)
Office Visit Note   Patient: [name] [name]           Date of Birth: [birth year]           MRN: [id number] Visit Date: 05/06/2023 Requested by: Nelwyn Salisbury, MD [address] [place],  [place] [address] PCP: Nelwyn Salisbury, MD  Subjective: Chief Complaint  Patient presents with   Other     Review EMG/NCV    HPI: [name] [name] is a 67 y.o. female who presents to the office reporting right hand pain numbness and tingling.  Since she was last seen she has had an EMG nerve study which shows moderate carpal tunnel on the right mild on the left.  She does report numbness and tingling some days but not all days.  Also reports some numbness and tingling some nights but not all nights.  Not too much loss of dexterity.  Doing well from revision total knee replacement..                ROS: All systems reviewed are negative as they relate to the chief complaint within the history of present illness.  Patient denies fevers or chills.  Assessment & Plan: Visit Diagnoses:  1. Paresthesia of skin     Plan: Impression is moderate carpal tunnel syndrome on the right.  Recommend over-the-counter anti-inflammatories along with this volar wrist splint which she will get to be used at night.  I think clinically she is not there yet for carpal tunnel release.  Risk benefits of surgery were discussed along with the out of work time which for her rather heavy job would be about 6 weeks.  She will come back when she wants to get that scheduled.  Follow-Up Instructions: No follow-ups on file.   Orders:  No orders of the defined types were placed in this encounter.  No orders of the defined types were placed in this encounter.     Procedures: No procedures performed   Clinical Data: No additional findings.  Objective: Vital Signs: There were no vitals taken for this visit.  Physical Exam:  Constitutional: Patient appears well-developed HEENT:  Head: Normocephalic Eyes:EOM are normal Neck:  Normal range of motion Cardiovascular: Normal rate Pulmonary/chest: Effort normal Neurologic: Patient is alert Skin: Skin is warm Psychiatric: Patient has normal mood and affect  Ortho Exam: Ortho exam demonstrates full active and passive range of motion of the right wrist with good grip strength bilaterally.  No abductor pollicis brevis wasting.  EPL FPL interosseous strength is intact.  Negative Tinel's cubital tunnel at the elbow.  Specialty Comments:  No specialty comments available.  Imaging: No results found.   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   Primary hypertension 03/09/2022   Osteoarthritis of right knee 08/27/2021   S/P total knee arthroplasty, 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   COVID-19 virus infection 04/09/2021   Dyslipidemia 03/31/2021   Urinary frequency 10/29/2017   Diabetes mellitus without complication (HCC) 09/14/2014   Special screening for malignant neoplasms, colon 11/25/2011   Diverticulosis of colon (without mention of hemorrhage) 11/25/2011   URTICARIA 01/31/2010   ECZEMA 05/03/2009   Past Medical History:  Diagnosis Date   Arthritis    Complication of anesthesia    Diabetes mellitus without complication (HCC)    Gallstones  Hypertension    PONV (postoperative nausea and vomiting)    Ruptured ovarian cyst     Family History  Problem Relation Age of Onset   Cancer Other        breast cancer   Colon cancer Neg Hx    Esophageal cancer Neg Hx    Stomach cancer Neg Hx    Rectal cancer Neg Hx     Past Surgical History:  Procedure Laterality Date   CESAREAN SECTION     COLONOSCOPY  06/05/2019   per Dr. Orvan Falconer, single adenomatous polyp, repeat in 7 yrs   KNEE ARTHROPLASTY Right 08/27/2021   Procedure: COMPUTER ASSISTED TOTAL KNEE ARTHROPLASTY;  Surgeon: Samson Frederic, MD;  Location: [facility];  Service: Orthopedics;  Laterality: Right;   KNEE CLOSED REDUCTION Right 11/20/2021   Procedure: CLOSED MANIPULATION KNEE;  Surgeon: Samson Frederic, MD;  Location: [facility];  Service: Orthopedics;  Laterality: Right;   lumpectomy,benign, right breast     x2   RIGHT SHOULDER SURGERY      TOTAL KNEE REVISION Right 02/02/2023   Procedure: REVISION RIGHT TOTAL KNEE ARTHROPLASTY;  Surgeon: Cammy Copa, MD;  Location: [facility] OR;  Service: Orthopedics;  Laterality: Right;   Social History   Occupational History   Occupation: food service  Tobacco Use   Smoking status: Never    Passive exposure: Never   Smokeless tobacco: Never  Vaping Use   Vaping status: Never Used  Substance and Sexual Activity   Alcohol use: Yes    Alcohol/week: 0.0 standard drinks of alcohol    Comment: maybe twice a month   Drug use: No   Sexual activity: Not on file

## 2023-05-21 ENCOUNTER — Other Ambulatory Visit (INDEPENDENT_AMBULATORY_CARE_PROVIDER_SITE_OTHER): Payer: 59

## 2023-05-21 DIAGNOSIS — R748 Abnormal levels of other serum enzymes: Secondary | ICD-10-CM | POA: Diagnosis not present

## 2023-05-21 DIAGNOSIS — D649 Anemia, unspecified: Secondary | ICD-10-CM | POA: Diagnosis not present

## 2023-05-21 LAB — VITAMIN B12: Vitamin B-12: 396 pg/mL (ref 211–911)

## 2023-05-21 LAB — CBC
HCT: 35.6 % — ABNORMAL LOW (ref 36.0–46.0)
Hemoglobin: 11.4 g/dL — ABNORMAL LOW (ref 12.0–15.0)
MCHC: 32 g/dL (ref 30.0–36.0)
MCV: 80.5 fl (ref 78.0–100.0)
Platelets: 325 10*3/uL (ref 150.0–400.0)
RBC: 4.42 Mil/uL (ref 3.87–5.11)
RDW: 15.4 % (ref 11.5–15.5)
WBC: 7.8 10*3/uL (ref 4.0–10.5)

## 2023-05-21 LAB — HEPATIC FUNCTION PANEL
ALT: 8 U/L (ref 0–35)
AST: 17 U/L (ref 0–37)
Albumin: 3.7 g/dL (ref 3.5–5.2)
Alkaline Phosphatase: 134 U/L — ABNORMAL HIGH (ref 39–117)
Bilirubin, Direct: 0 mg/dL (ref 0.0–0.3)
Total Bilirubin: 0.2 mg/dL (ref 0.2–1.2)
Total Protein: 7.5 g/dL (ref 6.0–8.3)

## 2023-05-21 LAB — IBC + FERRITIN
Ferritin: 34.4 ng/mL (ref 10.0–291.0)
Iron: 43 ug/dL (ref 42–145)
Saturation Ratios: 12.2 % — ABNORMAL LOW (ref 20.0–50.0)
TIBC: 351.4 ug/dL (ref 250.0–450.0)
Transferrin: 251 mg/dL (ref 212.0–360.0)

## 2023-05-21 LAB — GAMMA GT: GGT: 33 U/L (ref 7–51)

## 2023-05-26 ENCOUNTER — Ambulatory Visit (INDEPENDENT_AMBULATORY_CARE_PROVIDER_SITE_OTHER): Payer: 59 | Admitting: Surgical

## 2023-05-26 ENCOUNTER — Encounter: Payer: Self-pay | Admitting: Orthopedic Surgery

## 2023-05-26 DIAGNOSIS — Z96651 Presence of right artificial knee joint: Secondary | ICD-10-CM | POA: Diagnosis not present

## 2023-05-26 MED ORDER — AMOXICILLIN 500 MG PO TABS: 2000.0000 mg | ORAL_TABLET | Freq: Once | ORAL | 0 refills | Status: AC

## 2023-05-26 NOTE — Progress Notes (Signed)
Agree with the assessment and plan as outlined by Alcide Evener, NP.   If rectal bleeding persists despite adequate treatment of anal fissure, recommend repeat colonoscopy now for diagnostic and potential therapeutic intent.  Similarly, if continued anemia at repeat lab panel, would again consider repeat colonoscopy early.   Doristine Locks, DO, [facility]

## 2023-05-26 NOTE — Progress Notes (Signed)
Post-Op Visit Note   Patient: [name] [name]           Date of Birth: 05-07-1956           MRN: [id number] Visit Date: 05/26/2023 PCP: Nelwyn Salisbury, MD   Assessment & Plan:  Chief Complaint:  Chief Complaint  Patient presents with   Right Knee - Follow-up   Visit Diagnoses:  1. S/P revision of total knee, right     Plan: Patient is a 67 year old female who presents s/p right total knee arthroplasty revision for metal allergy on 02/02/2023.  She is about 3-1/2 months out from surgery.  Overall she is very satisfied with how her knee is doing.  No fevers or chills.  Does have occasional swelling and pain that is activity based but even after she does a lot of walking and activity, she is functional despite her pain which was not the case prior to her revision procedure.  Knee is not giving out on her.  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  She does this which involves standing on 100% of the time for 8 hours 5 days a week with  two 15-minute breaks and one 30-minute lunch break.  On exam, patient has good range of motion of the right knee with 2 degrees extension and 110 degrees of knee flexion.  Incision is well-healed.  No effusion is present.  No calf tenderness.  Negative Homans' sign.  She is able to perform straight leg raise without extensor lag and she has excellent quad strength.  She ambulates without any significant antalgia.  Plan is refill dental antibiotic prophylaxis.  She may return to work on 06/12/2023 and she will start with 6-hour shifts for the first 2 weeks and then go to full duty after that.  She will let us know how she does after this but otherwise follow-up as needed regarding her right knee and right hand carpal tunnel syndrome.  Follow-Up Instructions: No follow-ups on file.   Orders:  No orders of the defined types were placed in this encounter.  Meds ordered this encounter  Medications   amoxicillin (AMOXIL) 500 MG tablet    Sig: Take 4  tablets (2,000 mg total) by mouth once for 1 dose. Take 30-60 mins prior to dental appointment.    Dispense:  8 tablet    Refill:  0    Imaging: No results found.  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   Primary hypertension 03/09/2022   Osteoarthritis of right knee 08/27/2021   S/P total knee arthroplasty, 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   COVID-19 virus infection 04/09/2021   Dyslipidemia 03/31/2021   Urinary frequency 10/29/2017   Diabetes mellitus without complication (HCC) 09/14/2014   Special screening for malignant neoplasms, colon 11/25/2011   Diverticulosis of colon (without mention of hemorrhage) 11/25/2011   URTICARIA 01/31/2010   ECZEMA 05/03/2009   Past Medical History:  Diagnosis Date   Arthritis    Complication of anesthesia    Diabetes mellitus without complication (HCC)    Gallstones    Hypertension    PONV (postoperative nausea and vomiting)    Ruptured ovarian cyst     Family History  Problem Relation Age of Onset   Cancer Other        breast cancer  Colon cancer Neg Hx    Esophageal cancer Neg Hx    Stomach cancer Neg Hx    Rectal cancer Neg Hx     Past Surgical History:  Procedure Laterality Date   CESAREAN SECTION     COLONOSCOPY  06/05/2019   per Dr. Orvan Falconer, single adenomatous polyp, repeat in 7 yrs   KNEE ARTHROPLASTY Right 08/27/2021   Procedure: COMPUTER ASSISTED TOTAL KNEE ARTHROPLASTY;  Surgeon: Samson Frederic, MD;  Location: [facility];  Service: Orthopedics;  Laterality: Right;   KNEE CLOSED REDUCTION Right 11/20/2021   Procedure: CLOSED MANIPULATION KNEE;  Surgeon: Samson Frederic, MD;  Location: [facility];  Service: Orthopedics;  Laterality: Right;   lumpectomy,benign, right breast     x2   RIGHT SHOULDER SURGERY      TOTAL KNEE REVISION Right 02/02/2023    Procedure: REVISION RIGHT TOTAL KNEE ARTHROPLASTY;  Surgeon: Cammy Copa, MD;  Location: [facility] OR;  Service: Orthopedics;  Laterality: Right;   Social History   Occupational History   Occupation: food service  Tobacco Use   Smoking status: Never    Passive exposure: Never   Smokeless tobacco: Never  Vaping Use   Vaping status: Never Used  Substance and Sexual Activity   Alcohol use: Yes    Alcohol/week: 0.0 standard drinks of alcohol    Comment: maybe twice a month   Drug use: No   Sexual activity: Not on file

## 2023-06-01 ENCOUNTER — Ambulatory Visit (INDEPENDENT_AMBULATORY_CARE_PROVIDER_SITE_OTHER): Payer: 59 | Admitting: Family Medicine

## 2023-06-01 ENCOUNTER — Encounter: Payer: Self-pay | Admitting: Family Medicine

## 2023-06-01 VITALS — BP 124/78 | HR 78 | Temp 98.4°F | Wt 147.6 lb

## 2023-06-01 DIAGNOSIS — K760 Fatty (change of) liver, not elsewhere classified: Secondary | ICD-10-CM | POA: Insufficient documentation

## 2023-06-01 DIAGNOSIS — R748 Abnormal levels of other serum enzymes: Secondary | ICD-10-CM

## 2023-06-01 DIAGNOSIS — M810 Age-related osteoporosis without current pathological fracture: Secondary | ICD-10-CM

## 2023-06-01 NOTE — Progress Notes (Signed)
Subjective:    Patient ID: [name] [name], female    DOB: 1955/11/02, 67 y.o.   MRN: [id number]  HPI Here to discuss the results of recent labs ordered by Alcide Evener NP in the GI clinic. She saw Tatem on 04-22-23 for rectal bleeding, and she diagnosed an anal fissure. She treated this with Diltiazem/Lidocaine ointment, and the bleeding resolved. As part of her work up she had labs drawn on 05-21-23 that revealed a mildly elevated alkaline phosphatase level at 134. This had been normal until it was found to be up to 131 about 3 months ago. Her transaminases and GGT were all normal. She was advised to see [place] to evaluate this. Of note a CT scan of the abdomen in 2020 revealed hepatic steatosis, and an [place] in 2021 showed the same thing. As far as her bones, she has never had a bone density test. She had a bone scan last December as part of a work up for right knee pain, and this was normal except for increased uptake in the right knee as expected.    Review of Systems  Constitutional: Negative.   Respiratory: Negative.    Cardiovascular: Negative.   Musculoskeletal:  Positive for arthralgias.       Objective:   Physical Exam Constitutional:      Appearance: Normal appearance.  Cardiovascular:     Rate and Rhythm: Normal rate and regular rhythm.     Pulses: Normal pulses.     Heart sounds: Normal heart sounds.  Pulmonary:     Effort: Pulmonary effort is normal.     Breath sounds: Normal breath sounds.  Abdominal:     General: Abdomen is flat. Bowel sounds are normal. There is no distension.     Palpations: Abdomen is soft. There is no mass.     Tenderness: There is no abdominal tenderness. There is no guarding or rebound.     Hernia: No hernia is present.  Neurological:     Mental Status: She is alert.           Assessment & Plan:  She has an elevated alkaline phosphatase. I think the most likely explanation for this is the hepatic steatosis, but we will set up a DEXA to  evaluate the bones as well.  Gershon Crane, MD

## 2023-06-09 ENCOUNTER — Telehealth: Payer: Self-pay | Admitting: Family Medicine

## 2023-06-09 NOTE — Telephone Encounter (Signed)
Pt called to say she was just seen on 06/01/23. Pt states she has yet to hear from anyone regarding the referral to check for Osteoporosis.  I could not locate this referral in Pt's chart.   Pt would like to know where is the referral and where is she being referred to?  Please advise.

## 2023-06-10 NOTE — Telephone Encounter (Signed)
Scheduled pt for a Bone Density for 06/14/23.

## 2023-06-14 ENCOUNTER — Ambulatory Visit (INDEPENDENT_AMBULATORY_CARE_PROVIDER_SITE_OTHER)
Admission: RE | Admit: 2023-06-14 | Discharge: 2023-06-14 | Disposition: A | Discharge status: Home / Self Care | Payer: 59 | Source: Ambulatory Visit | Attending: Family Medicine | Admitting: Family Medicine

## 2023-06-14 DIAGNOSIS — M810 Age-related osteoporosis without current pathological fracture: Secondary | ICD-10-CM | POA: Diagnosis not present

## 2023-07-02 ENCOUNTER — Encounter: Payer: Self-pay | Admitting: Nurse Practitioner

## 2023-07-02 ENCOUNTER — Ambulatory Visit (INDEPENDENT_AMBULATORY_CARE_PROVIDER_SITE_OTHER): Payer: 59 | Admitting: Nurse Practitioner

## 2023-07-02 VITALS — BP 124/70 | HR 82 | Ht 61.0 in | Wt 151.0 lb

## 2023-07-02 DIAGNOSIS — K625 Hemorrhage of anus and rectum: Secondary | ICD-10-CM | POA: Diagnosis not present

## 2023-07-02 DIAGNOSIS — D649 Anemia, unspecified: Secondary | ICD-10-CM

## 2023-07-02 DIAGNOSIS — Z860100 Personal history of colon polyps, unspecified: Secondary | ICD-10-CM | POA: Diagnosis not present

## 2023-07-02 DIAGNOSIS — K602 Anal fissure, unspecified: Secondary | ICD-10-CM | POA: Diagnosis not present

## 2023-07-02 MED ORDER — NA SULFATE-K SULFATE-MG SULF 17.5-3.13-1.6 GM/177ML PO SOLN: 1.0000 | Freq: Once | ORAL | 0 refills | Status: AC

## 2023-07-02 NOTE — Patient Instructions (Addendum)
Please go to our lab in the basement in mid December - any time between 7:30am and 5:30pm.  You have been scheduled for a colonoscopy. Please follow written instructions given to you at your visit today.   Please pick up your prep supplies at the pharmacy within the next 1-3 days.  If you use inhalers (even only as needed), please bring them with you on the day of your procedure.  DO NOT TAKE 7 DAYS PRIOR TO TEST- Trulicity (dulaglutide) Ozempic, Wegovy (semaglutide) Mounjaro (tirzepatide) Bydureon Bcise (exanatide extended release)  DO NOT TAKE 1 DAY PRIOR TO YOUR TEST Rybelsus (semaglutide) Adlyxin (lixisenatide) Victoza (liraglutide) Byetta (exanatide) ___________________________________________________________________________ _______________________________________________________  If your blood pressure at your visit was 140/90 or greater, please contact your primary care physician to follow up on this.  _______________________________________________________  If you are age 42 or older, your body mass index should be between 23-30. Your Body mass index is 28.53 kg/m. If this is out of the aforementioned range listed, please consider follow up with your Primary Care Provider.  If you are age 73 or younger, your body mass index should be between 19-25. Your Body mass index is 28.53 kg/m. If this is out of the aformentioned range listed, please consider follow up with your Primary Care Provider.   ________________________________________________________  The [facility] providers would like to encourage you to use [facility] to communicate with providers for non-urgent requests or questions.  Due to long hold times on the telephone, sending your provider a message by [facility] may be a faster and more efficient way to get a response.  Please allow 48 business hours for a response.  Please remember that this is for non-urgent requests.   _______________________________________________________

## 2023-07-02 NOTE — Progress Notes (Signed)
07/02/2023 [name] [name] [id number] 17-May-1956   Chief Complaint: Follow up anal fissure   History of Present Illness: [name] [name] is a 67 year old female with a past medical history of arthritis, hypertension, diabetes mellitus type 2, gallstones and colon polyps. I saw her in office 04/22/2023 due to having rectal bleeding and on exam was found to have a posterior anal fissure treated with diltiazem 2%/lidocaine 5% ointment 3 times daily x 6 weeks.  She presents today for further follow-up.  She stated her anal rectal pain and bleeding abated within 2 weeks after applying the fissure cream.  She alternates taking Benefiber and MiraLAX as needed and does not feel she needs to take either 1 on a daily basis. She underwent a colonoscopy 06/05/2019 which showed one 3mm tubular adenomatous polyp removed from the distal descending colon and diverticulosis to the sigmoid colon.  Recall colonoscopy in 7 years was initially recommended, however, due to her recent rectal pain and bleeding a colonoscopy was recommended at this juncture.  She has infrequent left mid abdominal discomfort which was present prior to her colonoscopy in 2020 which infrequently recurs.  No abdominal pain at this time.  She denies having any GERD symptoms.  She underwent an EGD 06/05/2019 which showed mild gastritis without evidence of H. pylori.  Laboratory studies 04/19/2023 showed a mildly elevated alk phos level of 126.  Repeat labs 05/21/2023 showed alk phos level of 134 with normal total bili, AST and ALT levels.  GGT was normal at 33 therefore she was referred to her PCP to further evaluate the mildly elevated alk phos levels to possibly be coming from bone.  She subsequently underwent a bone density scan 06/14/2023 showed her 10 year major osteoporotic risk: 5.4%. 10 year hip fracture risk: 0.7%. The thresholds for treatment are 20% and 3%, respectively.  Advised to take calcium 1200 mg daily and vitamin D 800 mg IU.  She has  chronic macrocytic anemia.  Laboratory studies 05/21/2023 showed a hemoglobin level of 11.4.  Hematocrit 35.6.  MCV 80.5.  Iron 43.  Saturation ratio is 12.2.  Ferritin 34.4.  TIBC 351.4.  Vitamin B12 level 396.     Latest Ref Rng & Units 05/21/2023    1:19 PM 04/19/2023   11:05 AM 02/22/2023   11:27 AM  CBC  WBC 4.0 - 10.5 K/uL 7.8  7.1  6.6   Hemoglobin 12.0 - 15.0 g/dL 53.6  64.4  03.4   Hematocrit 36.0 - 46.0 % 35.6  37.1  33.0   Platelets 150.0 - 400.0 K/uL 325.0  298.0  399        Latest Ref Rng & Units 05/21/2023    1:19 PM 04/19/2023   11:05 AM 02/22/2023   11:27 AM  CMP  Glucose 70 - 99 mg/dL  742  595   BUN 6 - 23 mg/dL  15  16   Creatinine 6.38 - 1.20 mg/dL  7.56  4.33   Sodium 295 - 145 mEq/L  136  138   Potassium 3.5 - 5.1 mEq/L  3.9  4.0   Chloride 96 - 112 mEq/L  100  101   CO2 19 - 32 mEq/L  29  26   Calcium 8.4 - 10.5 mg/dL  9.3  8.7   Total Protein 6.0 - 8.3 g/dL 7.5  7.6  7.3   Total Bilirubin 0.2 - 1.2 mg/dL 0.2  0.3  0.4   Alkaline Phos 39 - 117 U/L 134  126  131   AST 0 - 37 U/L 17  18  21    ALT 0 - 35 U/L 8  9  11      EGD 06/05/2019 by Dr. Orvan Falconer: - Normal esophagus. No obvious cause of dysphagia identified on this study. Biopsied.  - Erythematous mucosa in the gastric body. Biopsied.  - Normal examined duodenum. Biopsied. -The examination was otherwise normal.   Colonoscopy 06/05/2019: - No source of abdominal pain identified on this examination.  - Diverticulosis in the sigmoid colon.  - One 3 mm polyp in the distal ascending colon, removed with a cold snare. Resected and retrieved.  - The examination was otherwise normal on direct and retroflexion views.  -7 year recal    1. Surgical [P], duodenal - DUODENAL MUCOSA WITH NO SIGNIFICANT PATHOLOGIC CHANGES. - NO FEATURES OF SPRUE OR GRANULOMAS. 2. Surgical [P], gastric antrum and gastric body - ANTRAL MUCOSA WITH MILD CHRONIC GASTRITIS. - WARTHIN-STARRY NEGATIVE FOR HELICOBACTER PYLORI. - NO  INTESTINAL METAPLASIA, DYSPLASIA OR MALIGNANCY. 3. Surgical [P], stomach, fundus - OXYNTIC MUCOSA WITH HYPEREMIA. - WARTHIN-STARRY NEGATIVE FOR HELICOBACTER PYLORI. - NO INTESTINAL METAPLASIA, DYSPLASIA OR MALIGNANCY. 4. Surgical [P], esophagus, GE junction - GASTROESOPHAGEAL MUCOSA WITH CHRONIC INFLAMMATION CONSISTENT WITH REFLUX. - PAS NEGATIVE FOR FUNGUS. - NO INTESTINAL METAPLASIA, DYSPLASIA OR MALIGNANCY. 5. Surgical [P], mid esophagus and distal esophagus - SQUAMOUS MUCOSA WITH NO SIGNIFICANT PATHOLOGIC CHANGES. - PAS NEGATIVE FOR FUNGUS - NO EOSINOPHILIC ESOPHAGITIS (LESS THAN 5 PER HIGH POWER FIELD). 6. Surgical [P], colon, ascending, polyp - TUBULAR ADENOMA(S). - NO HIGH GRADE DYSPLASIA OR MALIGNANCY.   Colonoscopy 04/26/2012 by Dr. Jarold Motto: -Diverticulosis, mild, left-sided diverticulosis -No polyps or cancers -Otherwise normal examination  Current Outpatient Medications on File Prior to Visit  Medication Sig Dispense Refill   BAYER LOW DOSE 81 MG chewable tablet CHEW 1 TABLET (81 MG TOTAL) BY MOUTH 2 (TWO) TIMES DAILY. 60 tablet 0   calcium carbonate (OSCAL) 1500 (600 Ca) MG TABS tablet Take by mouth 2 (two) times daily with a meal.     cholecalciferol (VITAMIN D3) 25 MCG (1000 UNIT) tablet Take 2,000 Units by mouth daily.     losartan (COZAAR) 50 MG tablet TAKE ONE TABLET BY MOUTH ONE TIME DAILY 90 tablet 0   metFORMIN (GLUCOPHAGE) 1000 MG tablet Take 0.5 tablets (500 mg total) by mouth 2 (two) times daily with a meal. 180 tablet 3   Omega-3 Fatty Acids (FISH OIL PO) Take 1 capsule by mouth daily.     benzonatate (TESSALON) 200 MG capsule Take 1 capsule (200 mg total) by mouth 3 (three) times daily as needed for cough. (Patient not taking: Reported on 07/02/2023) 60 capsule 0   methocarbamol (ROBAXIN) 500 MG tablet Take 1 tablet (500 mg total) by mouth every 8 (eight) hours as needed for muscle spasms. (Patient not taking: Reported on 07/02/2023) 30 tablet 0   NON  FORMULARY Diltiazem 2%/Lidocaine5% compound Use 3 x rectally daily for 2 months to heal anal fissure (Patient not taking: Reported on 07/02/2023) 30 g 1   oxyCODONE (OXY IR/ROXICODONE) 5 MG immediate release tablet Take 1 tablet (5 mg total) by mouth every 4 (four) hours as needed for moderate pain (pain score 4-6). (Patient not taking: Reported on 07/02/2023) 30 tablet 0   penicillin v potassium (VEETID) 500 MG tablet Take 500 mg by mouth 4 (four) times daily. (Patient not taking: Reported on 07/02/2023)     triamcinolone cream (KENALOG) 0.1 % Apply 1 Application topically 2 (two)  times daily. (Patient not taking: Reported on 07/02/2023) 45 g 5   No current facility-administered medications on file prior to visit.   Allergies  Allergen Reactions   Hydrocodone Bit-Homatrop Mbr Nausea And Vomiting   Current Medications, Allergies, Past Medical History, Past Surgical History, Family History and Social History were reviewed in [employer] record.  Review of Systems:   Constitutional: Negative for fever, sweats, chills or weight loss.  Respiratory: Negative for shortness of breath.   Cardiovascular: Negative for chest pain, palpitations and leg swelling.  Gastrointestinal: See HPI.  Musculoskeletal: Negative for back pain or muscle aches.  Neurological: Negative for dizziness, headaches or paresthesias.   Physical Exam: BP 124/70   Pulse 82   Ht 5\' 1"  (1.549 m)   Wt 151 lb (68.5 kg)   BMI 28.53 kg/m   General: 67 year old female in no acute distress. Head: Normocephalic and atraumatic. Eyes: No scleral icterus. Conjunctiva pink . Ears: Normal auditory acuity. Mouth: Dentition intact. No ulcers or lesions.  Lungs: Clear throughout to auscultation. Heart: Regular rate and rhythm, no murmur. Abdomen: Soft, nontender and nondistended. No masses or hepatomegaly. Normal bowel sounds x 4 quadrants.  Rectal: Patient declined repeat exam as rectal pain and bleeding  abated, patient to proceed with a colonoscopy for further evaluation Musculoskeletal: Symmetrical with no gross deformities. Extremities: No edema. Neurological: Alert oriented x 4. No focal deficits.  Psychological: Alert and cooperative. Normal mood and affect  Assessment and Recommendations:  67 year old female with anorectal pain and bright red rectal bleeding, likely secondary to a posterior anal fissure.  Rectal bleeding and pain abated after completing course of diltiazem/lidocaine fissure ointment 3 times daily x 6 weeks.  A colonoscopy was recommended at this time sure there was no other cause for her bright red rectal bleeding and rectal pain. Colonoscopy 06/05/2019 identified one 3mm TA polyp removed from the distal ascending colon.  -Colonoscopy benefits and risks discussed including risk with sedation, risk of bleeding, perforation and infection  -Benefiber daily, MiraLAX as needed -Contact office if rectal bleeding or rectal pain recurs    Normocytic anemia. Laboratory studies 05/21/2023 showed a hemoglobin level of 11.4. Hematocrit 35.6. MCV 80.5. Iron 43. Low saturation ratio 12.2. Ferritin 34.4. TIBC 351.4. Vitamin B12 level 396.  -Colonoscopy as ordered above -Repeat CBC and iron panel in mid December 2024   Elevated alk phos level with normal total bili, AST/ALT and GGT levels.  -Continue follow-up with PCP regarding risk for osteopenia/osteoporosis and calcium/vitamin D supplements   History of colon polyps. Colonoscopy 06/05/2019 identified one 3mm TA polyp removed from the distal ascending colon.  -Colonoscopy as ordered above   Diverticulosis. LLQ pain 1 or 2 months ago, abated. -Patient to contact office if abdominal pain recurs  -Miralax and Benefiber as recommended above

## 2023-07-10 IMAGING — DX DG KNEE 1-2V PORT*R*
2 series · 2 of 2 positions shown · non-contrast
Comparison: None.

CLINICAL DATA: Status post right knee replacement

EXAM:
PORTABLE RIGHT KNEE - 1-2 VIEW

[knee ap]
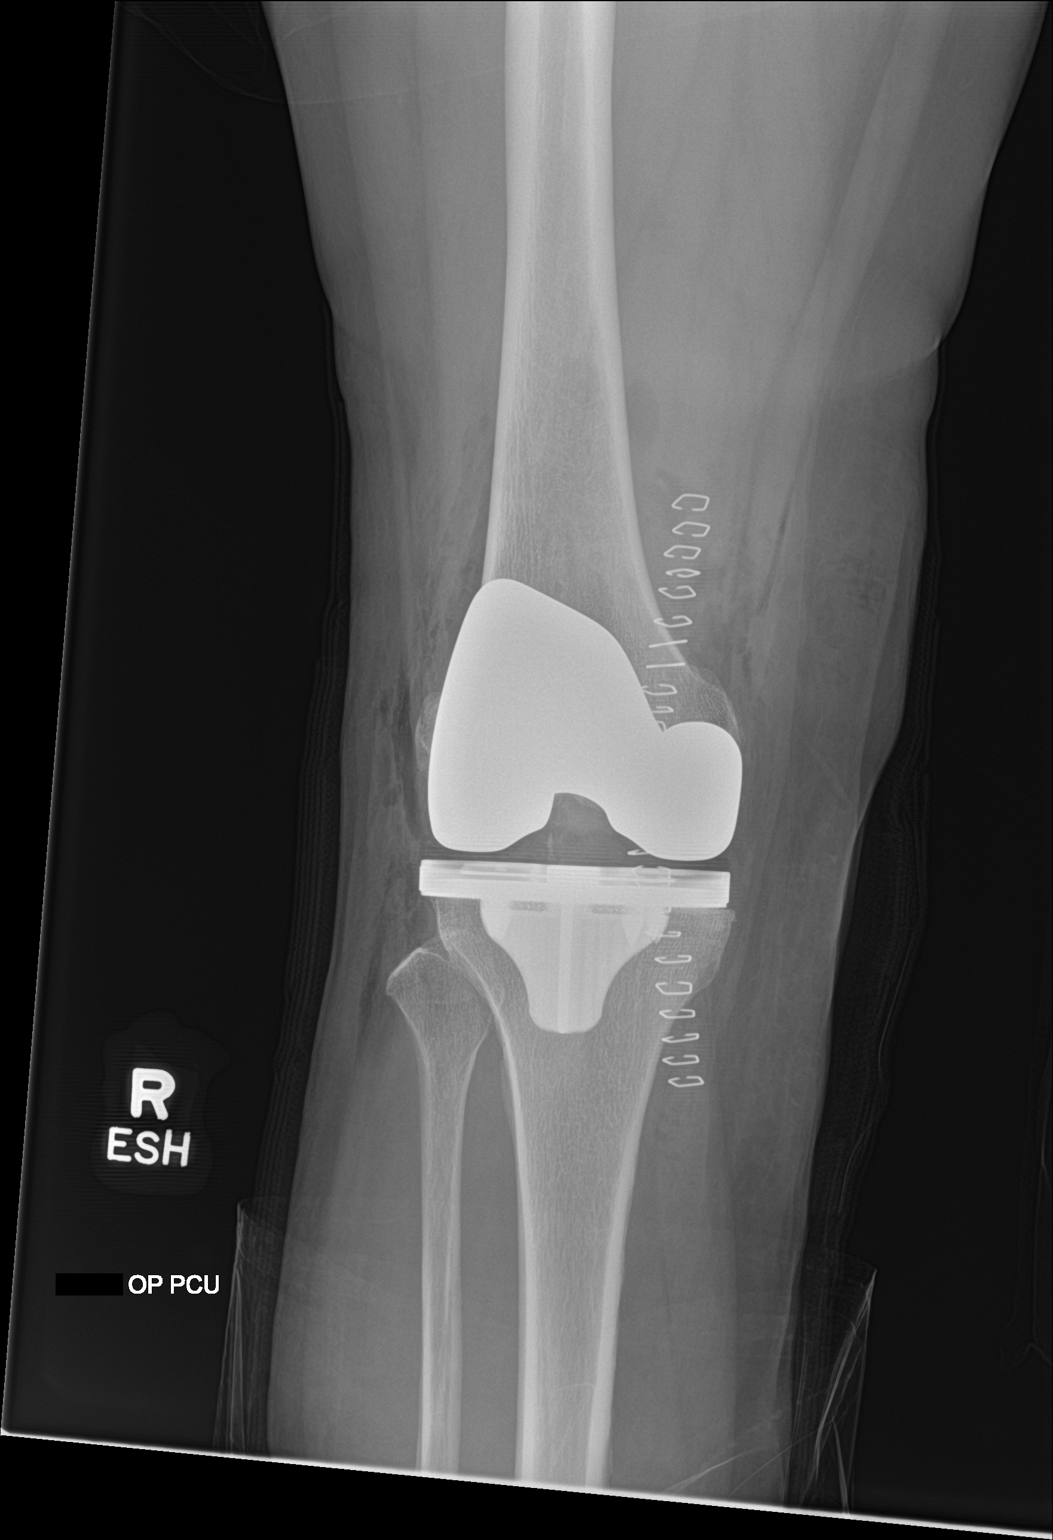

[knee lat]
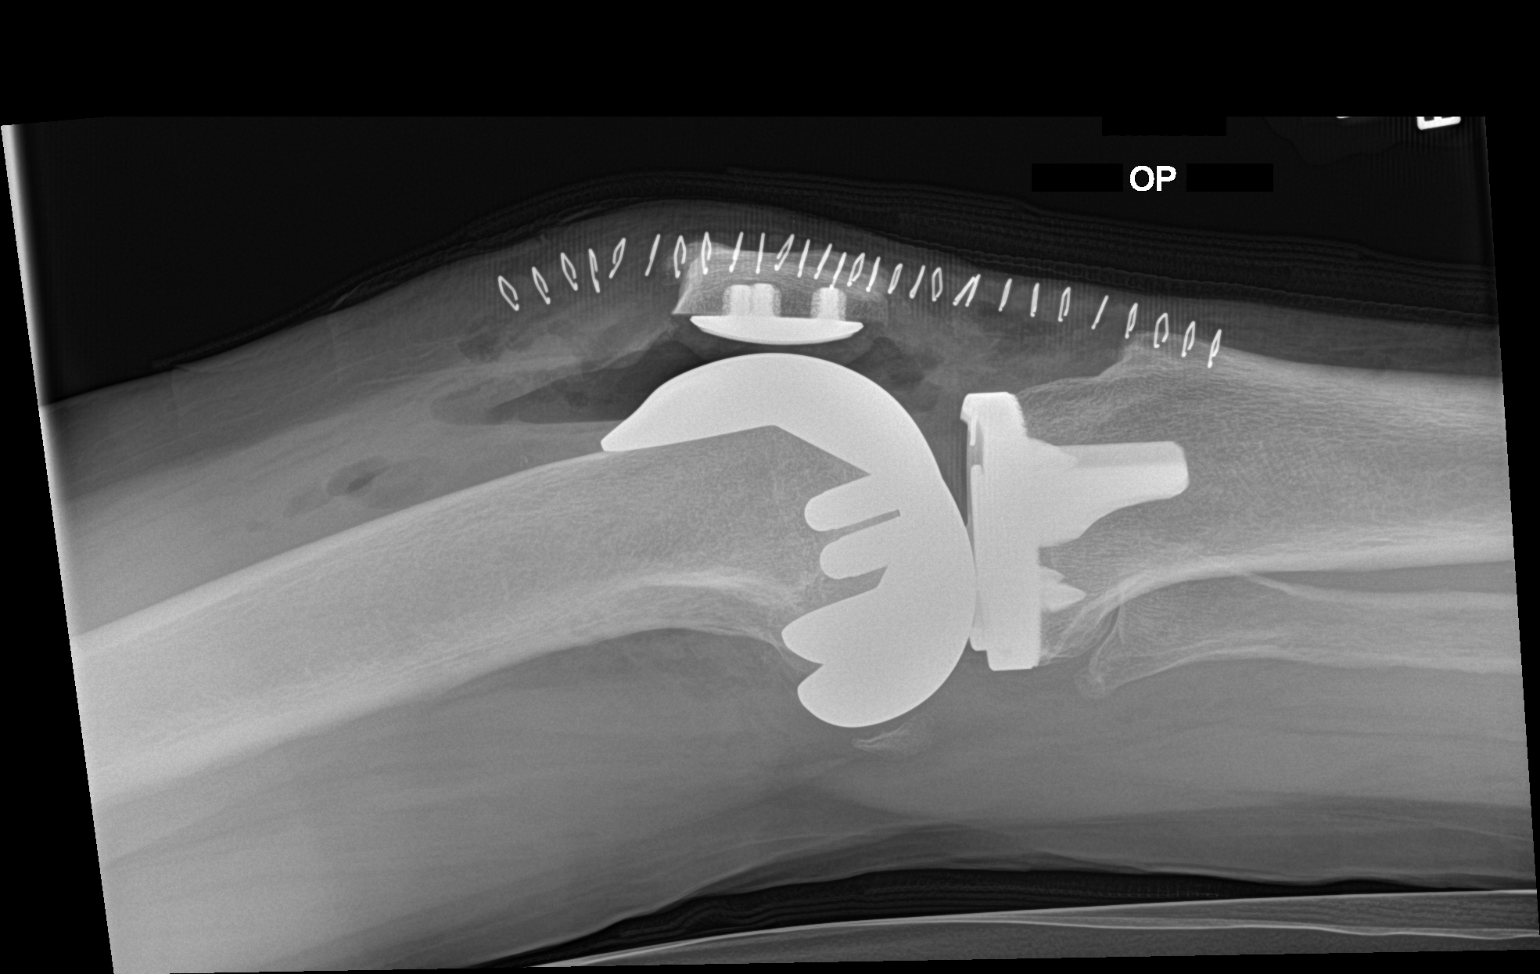

[2 of 2 positions shown; findings below may reference images not displayed]

FINDINGS: Status post right knee total arthroplasty with expected overlying
postoperative change. No evidence of perihardware fracture or
component malpositioning.
IMPRESSION: Status post right knee total arthroplasty with expected overlying
postoperative change. No evidence of perihardware fracture or
component malpositioning.

## 2023-07-23 NOTE — Progress Notes (Signed)
Agree with the assessment and plan as outlined by Colleen Kennedy-Smith, NP.   Trask Vosler, DO, FACG [facility]   

## 2023-08-25 ENCOUNTER — Other Ambulatory Visit (INDEPENDENT_AMBULATORY_CARE_PROVIDER_SITE_OTHER): Payer: 59

## 2023-08-25 DIAGNOSIS — K625 Hemorrhage of anus and rectum: Secondary | ICD-10-CM

## 2023-08-25 DIAGNOSIS — D649 Anemia, unspecified: Secondary | ICD-10-CM | POA: Diagnosis not present

## 2023-08-25 DIAGNOSIS — Z860100 Personal history of colon polyps, unspecified: Secondary | ICD-10-CM

## 2023-08-25 DIAGNOSIS — K602 Anal fissure, unspecified: Secondary | ICD-10-CM | POA: Diagnosis not present

## 2023-08-25 LAB — CBC
HCT: 34 % — ABNORMAL LOW (ref 36.0–46.0)
Hemoglobin: 11.1 g/dL — ABNORMAL LOW (ref 12.0–15.0)
MCHC: 32.8 g/dL (ref 30.0–36.0)
MCV: 81.4 fL (ref 78.0–100.0)
Platelets: 328 10*3/uL (ref 150.0–400.0)
RBC: 4.18 Mil/uL (ref 3.87–5.11)
RDW: 15.3 % (ref 11.5–15.5)
WBC: 8.3 10*3/uL (ref 4.0–10.5)

## 2023-08-25 LAB — IBC + FERRITIN
Ferritin: 38 ng/mL (ref 10.0–291.0)
Iron: 27 ug/dL — ABNORMAL LOW (ref 42–145)
Saturation Ratios: 7.7 % — ABNORMAL LOW (ref 20.0–50.0)
TIBC: 348.6 ug/dL (ref 250.0–450.0)
Transferrin: 249 mg/dL (ref 212.0–360.0)

## 2023-08-30 ENCOUNTER — Encounter: Payer: Self-pay | Admitting: Gastroenterology

## 2023-08-30 ENCOUNTER — Ambulatory Visit: Payer: 59 | Admitting: Gastroenterology

## 2023-08-30 VITALS — BP 145/53 | HR 67 | Temp 97.8°F | Resp 13 | Ht 61.0 in | Wt 151.0 lb

## 2023-08-30 DIAGNOSIS — K5730 Diverticulosis of large intestine without perforation or abscess without bleeding: Secondary | ICD-10-CM | POA: Diagnosis not present

## 2023-08-30 DIAGNOSIS — Z860100 Personal history of colon polyps, unspecified: Secondary | ICD-10-CM

## 2023-08-30 DIAGNOSIS — D125 Benign neoplasm of sigmoid colon: Secondary | ICD-10-CM

## 2023-08-30 DIAGNOSIS — Z1211 Encounter for screening for malignant neoplasm of colon: Secondary | ICD-10-CM

## 2023-08-30 MED ORDER — SODIUM CHLORIDE 0.9 % IV SOLN: 500.0000 mL | INTRAVENOUS | Status: DC

## 2023-08-30 NOTE — Patient Instructions (Signed)
Resume previous diet and medications.  Awaiting pathology results.  Repeat colonoscopy date to be determined based off pathology results. Handouts provided on colon polyps and diverticulosis.    YOU HAD AN ENDOSCOPIC PROCEDURE TODAY AT THE [facility]:   Refer to the procedure report that was given to you for any specific questions about what was found during the examination.  If the procedure report does not answer your questions, please call your gastroenterologist to clarify.  If you requested that your care partner not be given the details of your procedure findings, then the procedure report has been included in a sealed envelope for you to review at your convenience later.  YOU SHOULD EXPECT: Some feelings of bloating in the abdomen. Passage of more gas than usual.  Walking can help get rid of the air that was put into your GI tract during the procedure and reduce the bloating. If you had a lower endoscopy (such as a colonoscopy or flexible sigmoidoscopy) you may notice spotting of blood in your stool or on the toilet paper. If you underwent a bowel prep for your procedure, you may not have a normal bowel movement for a few days.  Please Note:  You might notice some irritation and congestion in your nose or some drainage.  This is from the oxygen used during your procedure.  There is no need for concern and it should clear up in a day or so.  SYMPTOMS TO REPORT IMMEDIATELY:  Following lower endoscopy (colonoscopy or flexible sigmoidoscopy):  Excessive amounts of blood in the stool  Significant tenderness or worsening of abdominal pains  Swelling of the abdomen that is new, acute  Fever of 100F or higher  For urgent or emergent issues, a gastroenterologist can be reached at any hour by calling [phone]. Do not use MyChart messaging for urgent concerns.    DIET:  We do recommend a small meal at first, but then you may proceed to your regular diet.  Drink plenty of fluids  but you should avoid alcoholic beverages for 24 hours.  ACTIVITY:  You should plan to take it easy for the rest of today and you should NOT DRIVE or use heavy machinery until tomorrow (because of the sedation medicines used during the test).    FOLLOW UP: Our staff will call the number listed on your records the next business day following your procedure.  We will call around 7:15- 8:00 am to check on you and address any questions or concerns that you may have regarding the information given to you following your procedure. If we do not reach you, we will leave a message.     If any biopsies were taken you will be contacted by phone or by letter within the next 1-3 weeks.  Please call us at [phone] if you have not heard about the biopsies in 3 weeks.    SIGNATURES/CONFIDENTIALITY: You and/or your care partner have signed paperwork which will be entered into your electronic medical record.  These signatures attest to the fact that that the information above on your After Visit Summary has been reviewed and is understood.  Full responsibility of the confidentiality of this discharge information lies with you and/or your care-partner.

## 2023-08-30 NOTE — Progress Notes (Signed)
Pt's states no medical or surgical changes since previsit or office visit. 

## 2023-08-30 NOTE — Op Note (Signed)
[facility] Patient Name: [name] [name] Procedure Date: 08/30/2023 8:13 AM MRN: [id number] Endoscopist: Doristine Locks , MD, [id number] Age: 67 Referring MD:  Date of Birth: 02-May-1956 Gender: Female Account #: [id number] Procedure:                Colonoscopy Indications:              Hematochezia                           History of colon polyps                           Last colonoscopy was 05/2019 and notable for a 3 mm                            distal descending colon adenoma, sigmoid                            diverticulosis. Medicines:                Monitored Anesthesia Care Procedure:                Pre-Anesthesia Assessment:                           - Prior to the procedure, a History and Physical                            was performed, and patient medications and                            allergies were reviewed. The patient's tolerance of                            previous anesthesia was also reviewed. The risks                            and benefits of the procedure and the sedation                            options and risks were discussed with the patient.                            All questions were answered, and informed consent                            was obtained. Prior Anticoagulants: The patient has                            taken no anticoagulant or antiplatelet agents. ASA                            Grade Assessment: II - A patient with mild systemic  disease. After reviewing the risks and benefits,                            the patient was deemed in satisfactory condition to                            undergo the procedure.                           After obtaining informed consent, the colonoscope                            was passed under direct vision. Throughout the                            procedure, the patient's blood pressure, pulse, and                            oxygen saturations were monitored  continuously. The                            CF [id number] #[id number] was introduced through the anus                            and advanced to the the terminal ileum. The                            colonoscopy was performed without difficulty. The                            patient tolerated the procedure well. The quality                            of the bowel preparation was good. The terminal                            ileum, ileocecal valve, appendiceal orifice, and                            rectum were photographed. Scope In: 8:21:47 AM Scope Out: 8:38:34 AM Scope Withdrawal Time: 0 hours 11 minutes 42 seconds  Total Procedure Duration: 0 hours 16 minutes 47 seconds  Findings:                 The perianal and digital rectal examinations were                            normal.                           A 4 mm polyp was found in the sigmoid colon. The                            polyp was sessile. The polyp was removed with a  cold snare. Resection and retrieval were complete.                            Estimated blood loss was minimal.                           Multiple medium-mouthed and small-mouthed                            diverticula were found in the sigmoid colon.                           The retroflexed view of the distal rectum and anal                            verge was normal and showed no anal or rectal                            abnormalities.                           The exam was otherwise normal throughout the                            remainder of the colon.                           The terminal ileum appeared normal. Complications:            No immediate complications. Estimated Blood Loss:     Estimated blood loss was minimal. Impression:               - One 4 mm polyp in the sigmoid colon, removed with                            a cold snare. Resected and retrieved.                           - Diverticulosis in the sigmoid colon.                            - The distal rectum and anal verge are normal on                            retroflexion view.                           - The examined portion of the ileum was normal. Recommendation:           - Patient has a contact number available for                            emergencies. The signs and symptoms of potential                            delayed complications were discussed  with the                            patient. Return to normal activities tomorrow.                            Written discharge instructions were provided to the                            patient.                           - Resume previous diet.                           - Continue present medications.                           - Await pathology results.                           - Repeat colonoscopy for surveillance based on                            pathology results.                           - Return to GI office PRN. Doristine Locks, MD 08/30/2023 8:45:16 AM

## 2023-08-30 NOTE — Progress Notes (Signed)
Sedate, gd SR, tolerated procedure well, VSS, report to RN 

## 2023-08-30 NOTE — Progress Notes (Signed)
GASTROENTEROLOGY PROCEDURE H&P NOTE   Primary Care Physician: Nelwyn Salisbury, MD    Reason for Procedure:  Colon polyp surveillance, rectal pain, hematochezia  Plan:    Colonoscopy  Patient is appropriate for endoscopic procedure(s) in the ambulatory ([facility]) setting.  The nature of the procedure, as well as the risks, benefits, and alternatives were carefully and thoroughly reviewed with the patient. Ample time for discussion and questions allowed. The patient understood, was satisfied, and agreed to proceed.     HPI: [name] [name] is a 67 y.o. female who presents for colonoscopy for ongoing colon polyp surveillance and colon cancer screening along with evaluation for recent rectal pain and hematochezia.  No active GI symptoms.  No known family history of colon cancer or related malignancy.  Patient is otherwise without complaints or active issues today.  Last colonoscopy was 05/2019 and notable for a 3 mm distal descending colon adenoma, sigmoid diverticulosis.  She was more recently seen in the GI clinic on 07/02/2023 with rectal pain and intermittent BRBPR, attributed to posterior anal fissure, treated with diltiazem/lidocaine, but recommended repeat colonoscopy for further evaluation.  Past Medical History:  Diagnosis Date   Arthritis    Complication of anesthesia    Diabetes mellitus without complication (HCC)    Gallstones    Hypertension    PONV (postoperative nausea and vomiting)    Ruptured ovarian cyst     Past Surgical History:  Procedure Laterality Date   CESAREAN SECTION     COLONOSCOPY  06/05/2019   per Dr. Orvan Falconer, single adenomatous polyp, repeat in 7 yrs   KNEE ARTHROPLASTY Right 08/27/2021   Procedure: COMPUTER ASSISTED TOTAL KNEE ARTHROPLASTY;  Surgeon: Samson Frederic, MD;  Location: [facility];  Service: Orthopedics;  Laterality: Right;   KNEE CLOSED REDUCTION Right 11/20/2021   Procedure: CLOSED MANIPULATION KNEE;  Surgeon: Samson Frederic, MD;  Location: [facility];  Service: Orthopedics;  Laterality: Right;   lumpectomy,benign, right breast     x2   RIGHT SHOULDER SURGERY      TOTAL KNEE REVISION Right 02/02/2023   Procedure: REVISION RIGHT TOTAL KNEE ARTHROPLASTY;  Surgeon: Cammy Copa, MD;  Location: [facility] OR;  Service: Orthopedics;  Laterality: Right;    Prior to Admission medications   Medication Sig Start Date End Date Taking? Authorizing Provider  BAYER LOW DOSE 81 MG chewable tablet CHEW 1 TABLET (81 MG TOTAL) BY MOUTH 2 (TWO) TIMES DAILY. 03/03/23   Magnant, Charles L, PA-C  benzonatate (TESSALON) 200 MG capsule Take 1 capsule (200 mg total) by mouth 3 (three) times daily as needed for cough. Patient not taking: Reported on 07/02/2023 04/08/23   Nelwyn Salisbury, MD  calcium carbonate (OSCAL) 1500 (600 Ca) MG TABS tablet Take by mouth 2 (two) times daily with a meal.    [provider]  cholecalciferol (VITAMIN D3) 25 MCG (1000 UNIT) tablet Take 2,000 Units by mouth daily.    [provider]  losartan (COZAAR) 50 MG tablet TAKE ONE TABLET BY MOUTH ONE TIME DAILY 04/06/23   Nelwyn Salisbury, MD  metFORMIN (GLUCOPHAGE) 1000 MG tablet Take 0.5 tablets (500 mg total) by mouth 2 (two) times daily with a meal. 01/22/23   Nelwyn Salisbury, MD  methocarbamol (ROBAXIN) 500 MG tablet Take 1 tablet (500 mg total) by mouth every 8 (eight) hours as needed for muscle spasms. Patient not taking: Reported on 07/02/2023 02/03/23   Magnant, Joycie Peek, PA-C  NON FORMULARY Diltiazem 2%/Lidocaine5% compound Use 3  x rectally daily for 2 months to heal anal fissure Patient not taking: Reported on 07/02/2023 04/22/23   Arnaldo Natal, NP  Omega-3 Fatty Acids (FISH OIL PO) Take 1 capsule by mouth daily.    [provider]  oxyCODONE (OXY IR/ROXICODONE) 5 MG immediate release tablet Take 1 tablet (5 mg total) by mouth every 4 (four) hours as needed for moderate pain (pain score 4-6). Patient not taking: Reported on 07/02/2023 02/03/23    Magnant, Joycie Peek, PA-C  penicillin v potassium (VEETID) 500 MG tablet Take 500 mg by mouth 4 (four) times daily. Patient not taking: Reported on 07/02/2023 05/28/23   [provider]  triamcinolone cream (KENALOG) 0.1 % Apply 1 Application topically 2 (two) times daily. Patient not taking: Reported on 07/02/2023 04/19/23   Nelwyn Salisbury, MD    Current Outpatient Medications  Medication Sig Dispense Refill   BAYER LOW DOSE 81 MG chewable tablet CHEW 1 TABLET (81 MG TOTAL) BY MOUTH 2 (TWO) TIMES DAILY. 60 tablet 0   benzonatate (TESSALON) 200 MG capsule Take 1 capsule (200 mg total) by mouth 3 (three) times daily as needed for cough. (Patient not taking: Reported on 07/02/2023) 60 capsule 0   calcium carbonate (OSCAL) 1500 (600 Ca) MG TABS tablet Take by mouth 2 (two) times daily with a meal.     cholecalciferol (VITAMIN D3) 25 MCG (1000 UNIT) tablet Take 2,000 Units by mouth daily.     losartan (COZAAR) 50 MG tablet TAKE ONE TABLET BY MOUTH ONE TIME DAILY 90 tablet 0   metFORMIN (GLUCOPHAGE) 1000 MG tablet Take 0.5 tablets (500 mg total) by mouth 2 (two) times daily with a meal. 180 tablet 3   methocarbamol (ROBAXIN) 500 MG tablet Take 1 tablet (500 mg total) by mouth every 8 (eight) hours as needed for muscle spasms. (Patient not taking: Reported on 07/02/2023) 30 tablet 0   NON FORMULARY Diltiazem 2%/Lidocaine5% compound Use 3 x rectally daily for 2 months to heal anal fissure (Patient not taking: Reported on 07/02/2023) 30 g 1   Omega-3 Fatty Acids (FISH OIL PO) Take 1 capsule by mouth daily.     oxyCODONE (OXY IR/ROXICODONE) 5 MG immediate release tablet Take 1 tablet (5 mg total) by mouth every 4 (four) hours as needed for moderate pain (pain score 4-6). (Patient not taking: Reported on 07/02/2023) 30 tablet 0   penicillin v potassium (VEETID) 500 MG tablet Take 500 mg by mouth 4 (four) times daily. (Patient not taking: Reported on 07/02/2023)     triamcinolone cream (KENALOG) 0.1 %  Apply 1 Application topically 2 (two) times daily. (Patient not taking: Reported on 07/02/2023) 45 g 5   Current Facility-Administered Medications  Medication Dose Route Frequency Provider Last Rate Last Admin   0.9 %  sodium chloride infusion  500 mL Intravenous Continuous Valeska Haislip V, DO        Allergies as of 08/30/2023 - Review Complete 07/02/2023  Allergen Reaction Noted   Hydrocodone bit-homatrop mbr Nausea And Vomiting 08/24/2014    Family History  Problem Relation Age of Onset   Cancer Other        breast cancer   Colon cancer Neg Hx    Esophageal cancer Neg Hx    Stomach cancer Neg Hx    Rectal cancer Neg Hx     Social History   Socioeconomic History   Marital status: Married    Spouse name: Not on file   Number of children: 1  Years of education: Not on file   Highest education level: Not on file  Occupational History   Occupation: food service  Tobacco Use   Smoking status: Never    Passive exposure: Never   Smokeless tobacco: Never  Vaping Use   Vaping status: Never Used  Substance and Sexual Activity   Alcohol use: Yes    Alcohol/week: 0.0 standard drinks of alcohol    Comment: maybe twice a month   Drug use: No   Sexual activity: Not on file  Other Topics Concern   Not on file  Social History Narrative   Married   Never Smoker   Alcohol use-yes   Drug use-no         Social Drivers of Health   Financial Resource Strain: Not on file  Food Insecurity: No Food Insecurity (02/03/2023)   Hunger Vital Sign    Worried About Running Out of Food in the Last Year: Never true    Ran Out of Food in the Last Year: Never true  Transportation Needs: No Transportation Needs (02/03/2023)   PRAPARE - Administrator, Civil Service (Medical): No    Lack of Transportation (Non-Medical): No  Physical Activity: Not on file  Stress: Not on file  Social Connections: Not on file  Intimate Partner Violence: Not At Risk (02/03/2023)   Humiliation,  Afraid, Rape, and Kick questionnaire    Fear of Current or Ex-Partner: No    Emotionally Abused: No    Physically Abused: No    Sexually Abused: No    Physical Exam: Vital signs in last 24 hours: @BP  (!) 149/78   Pulse 77   Temp 97.8 F (36.6 C) (Temporal)   Ht 5\' 1"  (1.549 m)   Wt 151 lb (68.5 kg)   SpO2 97%   BMI 28.53 kg/m  GEN: NAD EYE: Sclerae anicteric ENT: MMM CV: Non-tachycardic Pulm: CTA b/l GI: Soft, NT/ND NEURO:  Alert & Oriented x 3   Doristine Locks, DO [facility]   08/30/2023 8:03 AM

## 2023-08-31 ENCOUNTER — Telehealth: Payer: Self-pay | Admitting: *Deleted

## 2023-08-31 NOTE — Telephone Encounter (Signed)
Attempted post procedure follow up call.  No answer -LVM.

## 2023-09-02 NOTE — Telephone Encounter (Signed)
Patient returned your call and stated she was feeling ok from the procedure. Please advise.

## 2023-09-03 ENCOUNTER — Other Ambulatory Visit: Payer: Self-pay

## 2023-09-03 DIAGNOSIS — D649 Anemia, unspecified: Secondary | ICD-10-CM

## 2023-09-03 MED ORDER — IRON (FERROUS SULFATE) 325 (65 FE) MG PO TABS: 325.0000 mg | ORAL_TABLET | Freq: Every day | ORAL | 1 refills | Status: AC

## 2023-09-06 LAB — SURGICAL PATHOLOGY

## 2023-10-13 ENCOUNTER — Encounter: Payer: Self-pay | Admitting: Family Medicine

## 2023-10-13 ENCOUNTER — Ambulatory Visit: Payer: 59 | Admitting: Family Medicine

## 2023-10-13 VITALS — BP 132/80 | HR 77 | Temp 98.6°F | Wt 146.0 lb

## 2023-10-13 DIAGNOSIS — J40 Bronchitis, not specified as acute or chronic: Secondary | ICD-10-CM

## 2023-10-13 DIAGNOSIS — R0989 Other specified symptoms and signs involving the circulatory and respiratory systems: Secondary | ICD-10-CM

## 2023-10-13 LAB — POCT INFLUENZA A/B
Influenza A, POC: NEGATIVE
Influenza B, POC: NEGATIVE

## 2023-10-13 LAB — POC COVID19 BINAXNOW: SARS Coronavirus 2 Ag: NEGATIVE

## 2023-10-13 MED ORDER — DOXYCYCLINE HYCLATE 100 MG PO TABS: 100.0000 mg | ORAL_TABLET | Freq: Two times a day (BID) | ORAL | 0 refills | Status: DC

## 2023-10-13 NOTE — Progress Notes (Signed)
   Subjective:    Patient ID: [name] [name], female    DOB: 12/10/1955, 68 y.o.   MRN: [id number]  HPI For the past 2 weeks she has had a stuffy head, PND, and coughing up yellow sputum. No fever or SOB. When this started she was in [place] visiting her sister. She saw a clinic there, and they gave her 10 days of Cephalexin. This has not helped.    Review of Systems  Constitutional: Negative.   HENT:  Positive for congestion and postnasal drip. Negative for ear pain, sinus pressure and sore throat.   Eyes: Negative.   Respiratory:  Positive for cough. Negative for shortness of breath and wheezing.        Objective:   Physical Exam Constitutional:      Appearance: Normal appearance.  HENT:     Right Ear: Tympanic membrane, ear canal and external ear normal.     Left Ear: Tympanic membrane, ear canal and external ear normal.     Nose: Nose normal.     Mouth/Throat:     Pharynx: Oropharynx is clear.  Eyes:     Conjunctiva/sclera: Conjunctivae normal.  Pulmonary:     Effort: Pulmonary effort is normal.     Breath sounds: Normal breath sounds.  Lymphadenopathy:     Cervical: No cervical adenopathy.  Neurological:     Mental Status: She is alert.           Assessment & Plan:  Bronchitis, treat with 10 days of Doxycycline .  Garnette Olmsted, MD

## 2023-10-13 NOTE — Addendum Note (Signed)
 Addended by: Philbert Brave on: 10/13/2023 02:22 PM   Modules accepted: Orders

## 2023-10-25 ENCOUNTER — Telehealth: Payer: Self-pay

## 2023-10-25 NOTE — Telephone Encounter (Signed)
Copied from CRM [id number]. Topic: Appointments - Appointment Scheduling >> Oct 25, 2023 10:15 AM Truddie Crumble wrote: Patient/patient representative is calling to schedule an appointment. Refer to attachments for appointment information. Patient called stating her cough is not getting better and she would like to see if she is able to get more medication or something different

## 2023-10-29 ENCOUNTER — Ambulatory Visit: Payer: 59 | Admitting: Family Medicine

## 2023-10-29 ENCOUNTER — Encounter: Payer: Self-pay | Admitting: Family Medicine

## 2023-10-29 VITALS — BP 110/68 | HR 68 | Temp 98.4°F | Wt 147.0 lb

## 2023-10-29 DIAGNOSIS — J069 Acute upper respiratory infection, unspecified: Secondary | ICD-10-CM | POA: Diagnosis not present

## 2023-10-29 MED ORDER — METHYLPREDNISOLONE 4 MG PO TBPK: ORAL_TABLET | ORAL | 0 refills | Status: DC

## 2023-10-29 MED ORDER — ALBUTEROL SULFATE HFA 108 (90 BASE) MCG/ACT IN AERS: 2.0000 | INHALATION_SPRAY | RESPIRATORY_TRACT | 0 refills | Status: DC | PRN

## 2023-10-29 NOTE — Progress Notes (Signed)
   Subjective:    Patient ID: [name] [name], female    DOB: 10-18-1955, 68 y.o.   MRN: [id number]  HPI Here with her son to follow up on a cough that she has had for the past 4 weeks. She has taken a course of Cephalexin and a course of Doxycycline, but neither has made much difference. She still has a hard cough thatr is sometimes dry and sometimes produces clear sputum. No fever. She does get mildly SOB at times. The cough is always worst at night.    Review of Systems  Constitutional: Negative.   HENT: Negative.    Eyes: Negative.   Respiratory:  Positive for cough and shortness of breath. Negative for wheezing.   Cardiovascular: Negative.        Objective:   Physical Exam Constitutional:      Appearance: Normal appearance. She is not ill-appearing.  Cardiovascular:     Rate and Rhythm: Normal rate and regular rhythm.     Pulses: Normal pulses.     Heart sounds: Normal heart sounds.  Pulmonary:     Effort: Pulmonary effort is normal.     Breath sounds: Normal breath sounds.  Neurological:     Mental Status: She is alert.           Assessment & Plan:  She has a viral URI (most likely due to RSV) and this is causing some asthma-like symptoms. She will take a Medrol dose pack and she can use an albuterol inhaler as needed.  Gershon Crane, MD

## 2023-10-29 NOTE — Telephone Encounter (Signed)
Pt had appointment with Dr Clent Ridges today

## 2023-11-15 ENCOUNTER — Other Ambulatory Visit (INDEPENDENT_AMBULATORY_CARE_PROVIDER_SITE_OTHER): Payer: Self-pay

## 2023-11-15 ENCOUNTER — Ambulatory Visit: Payer: 59 | Admitting: Orthopedic Surgery

## 2023-11-15 ENCOUNTER — Encounter: Payer: Self-pay | Admitting: Orthopedic Surgery

## 2023-11-15 DIAGNOSIS — M79642 Pain in left hand: Secondary | ICD-10-CM

## 2023-11-15 NOTE — Progress Notes (Unsigned)
 Office Visit Note   Patient: [name] [name]           Date of Birth: [birth year]           MRN: [id number] Visit Date: 11/15/2023 Requested by: Nelwyn Salisbury, MD [address] [place],  [place] [address] PCP: Nelwyn Salisbury, MD  Subjective: Chief Complaint  Patient presents with   Left Hand - Pain    HPI: [name] is a 68 y.o. female who presents to the office reporting left hand and thumb pain.  Denies any history of injury.  Has had pain for many months but it has been worse over the last 3 to 4 weeks.  Describes a pinching sensation that starts around the base of her thumb and radiates up into the volar forearm.  Denies much in the way of numbness and tingling.  Does have known history of moderate carpal tunnel syndrome on the right and mild on the left.  Tried hot water to the hand which has not been helpful..                ROS: All systems reviewed are negative as they relate to the chief complaint within the history of present illness.  Patient denies fevers or chills.  Assessment & Plan: Visit Diagnoses:  1. 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  Does have pain localizing to that first [facility] joint.  Grind test not as impressive clinically as the radiographs are.  I think topical Voltaren indicated as a trial and then if that does not help we could proceed with CMC injection under ultrasound guidance.  Follow-up at that time.  Also could consider occasional over-the-counter anti-inflammatories for symptoms if they become severe.  Follow-Up Instructions: No follow-ups on file.   Orders:  Orders Placed This Encounter  Procedures   XR Hand Complete Left   No orders of the defined types were placed in this encounter.     Procedures: No procedures performed   Clinical Data: No additional findings.  Objective: Vital Signs: There were no vitals taken for this visit.  Physical Exam:  Constitutional: Patient  appears well-developed HEENT:  Head: Normocephalic Eyes:EOM are normal Neck: Normal range of motion Cardiovascular: Normal rate Pulmonary/chest: Effort normal Neurologic: Patient is alert Skin: Skin is warm Psychiatric: Patient has normal mood and affect  Ortho Exam: Ortho exam demonstrates equivocal grind test for CMC arthritis.  EPL FPL interosseous strength is intact.  Negative Tinel's cubital tunnel in the elbow with no subluxation of the ulnar nerve on the left-hand side.  Wrist range of motion full.  Radial pulse intact.  Has otherwise full composite flexion extension of the fingers and thumb.  Specialty Comments:  No specialty comments available.  Imaging: XR Hand Complete Left Result Date: 11/15/2023 AP lateral oblique radiographs left hand reviewed.  No acute fracture.  Radiocarpal alignment intact.  Severe arthritis in the first [facility] joint is present with erosive changes and obliquity of the first carpometacarpal joint.    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  COVID-19 virus infection 04/09/2021   Dyslipidemia 03/31/2021   Urinary frequency 10/29/2017   Diabetes mellitus without complication (HCC) 09/14/2014   Special screening for malignant neoplasms, colon 11/25/2011   Diverticulosis of colon 11/25/2011   URTICARIA 01/31/2010   ECZEMA 05/03/2009   Past Medical History:  Diagnosis Date   Arthritis    Complication of anesthesia    Diabetes mellitus without complication (HCC)    Gallstones    Hypertension    PONV (postoperative nausea and vomiting)    Ruptured ovarian  cyst     Family History  Problem Relation Age of Onset   Cancer Other        breast cancer   Colon cancer Neg Hx    Esophageal cancer Neg Hx    Stomach cancer Neg Hx    Rectal cancer Neg Hx     Past Surgical History:  Procedure Laterality Date   CESAREAN SECTION     COLONOSCOPY  06/05/2019   per Dr. Orvan Falconer, single adenomatous polyp, repeat in 7 yrs   KNEE ARTHROPLASTY Right 08/27/2021   Procedure: COMPUTER ASSISTED TOTAL KNEE ARTHROPLASTY;  Surgeon: Samson Frederic, MD;  Location: [facility];  Service: Orthopedics;  Laterality: Right;   KNEE CLOSED REDUCTION Right 11/20/2021   Procedure: CLOSED MANIPULATION KNEE;  Surgeon: Samson Frederic, MD;  Location: [facility];  Service: Orthopedics;  Laterality: Right;   lumpectomy,benign, right breast     x2   RIGHT SHOULDER SURGERY      TOTAL KNEE REVISION Right 02/02/2023   Procedure: REVISION RIGHT TOTAL KNEE ARTHROPLASTY;  Surgeon: Cammy Copa, MD;  Location: [facility] OR;  Service: Orthopedics;  Laterality: Right;   Social History   Occupational History   Occupation: food service  Tobacco Use   Smoking status: Never    Passive exposure: Never   Smokeless tobacco: Never  Vaping Use   Vaping status: Never Used  Substance and Sexual Activity   Alcohol use: Yes    Alcohol/week: 0.0 standard drinks of alcohol    Comment: maybe twice a month   Drug use: No   Sexual activity: Not on file

## 2023-12-01 ENCOUNTER — Other Ambulatory Visit (INDEPENDENT_AMBULATORY_CARE_PROVIDER_SITE_OTHER)

## 2023-12-01 DIAGNOSIS — D649 Anemia, unspecified: Secondary | ICD-10-CM

## 2023-12-01 LAB — CBC
HCT: 36.1 % (ref 36.0–46.0)
Hemoglobin: 11.8 g/dL — ABNORMAL LOW (ref 12.0–15.0)
MCHC: 32.8 g/dL (ref 30.0–36.0)
MCV: 81.2 fl (ref 78.0–100.0)
Platelets: 395 10*3/uL (ref 150.0–400.0)
RBC: 4.44 Mil/uL (ref 3.87–5.11)
RDW: 16.6 % — ABNORMAL HIGH (ref 11.5–15.5)
WBC: 8.1 10*3/uL (ref 4.0–10.5)

## 2023-12-02 LAB — IBC + FERRITIN
Ferritin: 37.4 ng/mL (ref 10.0–291.0)
Iron: 38 ug/dL — ABNORMAL LOW (ref 42–145)
Saturation Ratios: 10.3 % — ABNORMAL LOW (ref 20.0–50.0)
TIBC: 368.2 ug/dL (ref 250.0–450.0)
Transferrin: 263 mg/dL (ref 212.0–360.0)

## 2023-12-07 ENCOUNTER — Other Ambulatory Visit: Payer: Self-pay | Admitting: *Deleted

## 2023-12-07 ENCOUNTER — Encounter: Payer: Self-pay | Admitting: *Deleted

## 2023-12-07 DIAGNOSIS — D649 Anemia, unspecified: Secondary | ICD-10-CM

## 2023-12-13 ENCOUNTER — Other Ambulatory Visit (INDEPENDENT_AMBULATORY_CARE_PROVIDER_SITE_OTHER): Payer: Self-pay

## 2023-12-13 ENCOUNTER — Other Ambulatory Visit: Payer: Self-pay

## 2023-12-13 ENCOUNTER — Ambulatory Visit: Admitting: Surgical

## 2023-12-13 DIAGNOSIS — M79641 Pain in right hand: Secondary | ICD-10-CM

## 2023-12-13 DIAGNOSIS — M79642 Pain in left hand: Secondary | ICD-10-CM | POA: Diagnosis not present

## 2023-12-13 DIAGNOSIS — M25561 Pain in right knee: Secondary | ICD-10-CM | POA: Diagnosis not present

## 2023-12-13 DIAGNOSIS — M1812 Unilateral primary osteoarthritis of first carpometacarpal joint, left hand: Secondary | ICD-10-CM | POA: Diagnosis not present

## 2023-12-15 ENCOUNTER — Encounter: Payer: Self-pay | Admitting: Surgical

## 2023-12-15 MED ORDER — LIDOCAINE HCL 1 % IJ SOLN
3.0000 mL | INTRAMUSCULAR | Status: AC | PRN
Start: 2023-12-13 — End: 2023-12-13
  Administered 2023-12-13: 3 mL

## 2023-12-15 MED ORDER — METHYLPREDNISOLONE ACETATE 40 MG/ML IJ SUSP
13.3300 mg | INTRAMUSCULAR | Status: AC | PRN
  Administered 2023-12-13: 13.33 mg via INTRA_ARTICULAR

## 2023-12-15 MED ORDER — BUPIVACAINE HCL 0.25 % IJ SOLN
0.3300 mL | INTRAMUSCULAR | Status: AC | PRN
Start: 2023-12-13 — End: 2023-12-13
  Administered 2023-12-13: .33 mL via INTRA_ARTICULAR

## 2023-12-15 NOTE — Progress Notes (Signed)
 Follow-up Office Visit Note   Patient: [name] [name]           Date of Birth: [birth year]           MRN: [id number] Visit Date: 12/13/2023 Requested by: Nelwyn Salisbury, MD [address] [place],  [place] [address] PCP: Nelwyn Salisbury, MD  Subjective: Chief Complaint  Patient presents with   Left Hand - Pain   Right Knee - Pain    HPI: [name] [name] is a 68 y.o. female who returns to the office for follow-up visit.    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 Does have pain localizing to that first [facility] joint. Grind test not as impressive clinically as the radiographs are. I think topical Voltaren indicated as a trial and then if that does not help we could proceed with CMC injection under ultrasound guidance. Follow-up at that time. Also could consider occasional over-the-counter anti-inflammatories for symptoms if they become severe.   Since then, patient notes continued pain at the base of the left thumb that has not responded to Voltaren.  She also fell on Saturday injuring her right knee.  She has history of right knee revision knee replacement.  Has been taking Advil with some relief of pain.  Feels like the leg feels somewhat weak especially after working.  She works at [employer] which involves being on her legs for 8-hour shifts.  She is able to bear weight on the right leg.              ROS: All systems reviewed are negative as they relate to the chief complaint within the history of present illness.  Patient denies fevers or chills.  Assessment & Plan: Visit Diagnoses:  1. Acute pain of right knee   2. Bilateral hand pain     Plan: [name] [name] is a 68 y.o. female who returns to the office for follow-up visit.  Plan from last visit was noted above in HPI.  They now return with continued left thumb pain.  She would like to try left CMC injection.  This was administered under ultrasound guidance.  Regarding her right knee, radiographs  are unremarkable with no significant change of the the appearance of the prosthesis compared with previous radiographs.  I think she needs to give this a little bit more time since the fall on Saturday to see if the right knee pain will resolve and progress back to her baseline.  In the meantime, plan to give her a work note stating she is okay for 6-hour shifts instead of 8-hour shifts for the next 2 weeks to give her a little bit of a respite from being on her feet all the time.  Follow-up as needed in 2 to 3 weeks if pain does not improve from [facility] injection.  Follow-Up Instructions: No follow-ups on file.   Orders:  Orders Placed This Encounter  Procedures   XR Knee 1-2 Views Right   US Guided Needle Placement - No Linked Charges   No orders of the defined types were placed in this encounter.     Procedures: Small Joint Inj: L thumb CMC on 12/13/2023 12:07 PM Indications: pain Details: 25 G needle, fluoroscopy-guided radial approach  Spinal Needle: No  Medications: 3 mL lidocaine 1 %; 0.33 mL bupivacaine 0.25 %; 13.33 mg methylPREDNISolone acetate 40 MG/ML Outcome: tolerated well, no immediate complications Procedure, treatment alternatives, risks and benefits explained, specific risks discussed. Consent was  given by the patient. Immediately prior to procedure a time out was called to verify the correct patient, procedure, equipment, support staff and site/side marked as required. Patient was prepped and draped in the usual sterile fashion.       Clinical Data: No additional findings.  Objective: Vital Signs: There were no vitals taken for this visit.  Physical Exam:  Constitutional: Patient appears well-developed HEENT:  Head: Normocephalic Eyes:EOM are normal Neck: Normal range of motion Cardiovascular: Normal rate Pulmonary/chest: Effort normal Neurologic: Patient is alert Skin: Skin is warm Psychiatric: Patient has normal mood and affect  Ortho Exam: Ortho exam  demonstrates left thumb with positive grind test.  Intact EPL, FPL.  Tenderness over the first [facility] joint.  No cellulitis or skin changes noted.  Right knee with incision well-healed.  No effusion.  No gross mid flexion instability.  She has no significant instability to varus or valgus stress at 0 and 30 degrees.  Excellent quadricep strength rated 5/5.  Able to perform straight leg raise without extensor lag.  No significant tenderness over the lateral femoral condyle or the medial femoral condyle.  Ambulates with mild antalgia.  Specialty Comments:  No specialty comments available.  Imaging: No results found.   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   COVID-19 virus infection 04/09/2021   Dyslipidemia 03/31/2021   Urinary frequency 10/29/2017   Diabetes mellitus without complication (HCC) 09/14/2014   Special screening for malignant neoplasms, colon 11/25/2011   Diverticulosis of colon 11/25/2011   URTICARIA 01/31/2010   ECZEMA 05/03/2009   Past Medical History:  Diagnosis Date   Arthritis    Complication of anesthesia    Diabetes mellitus without complication (HCC)    Gallstones    Hypertension    PONV (postoperative nausea and vomiting)    Ruptured ovarian cyst     Family History  Problem Relation Age of Onset   Cancer Other        breast cancer   Colon cancer Neg Hx    Esophageal cancer Neg Hx    Stomach cancer Neg Hx    Rectal cancer Neg Hx     Past Surgical History:  Procedure Laterality Date   CESAREAN SECTION     COLONOSCOPY  06/05/2019   per Dr. Orvan Falconer, single  adenomatous polyp, repeat in 7 yrs   KNEE ARTHROPLASTY Right 08/27/2021   Procedure: COMPUTER ASSISTED TOTAL KNEE ARTHROPLASTY;  Surgeon: Samson Frederic, MD;  Location: [facility];  Service: Orthopedics;  Laterality: Right;   KNEE CLOSED REDUCTION Right 11/20/2021   Procedure: CLOSED MANIPULATION KNEE;  Surgeon: Samson Frederic, MD;  Location: [facility];  Service: Orthopedics;  Laterality: Right;   lumpectomy,benign, right breast     x2   RIGHT SHOULDER SURGERY      TOTAL KNEE REVISION Right 02/02/2023   Procedure: REVISION RIGHT TOTAL KNEE ARTHROPLASTY;  Surgeon: Cammy Copa, MD;  Location: [facility] OR;  Service: Orthopedics;  Laterality: Right;   Social History   Occupational History   Occupation: food service  Tobacco Use   Smoking status: Never    Passive exposure: Never  Smokeless tobacco: Never  Vaping Use   Vaping status: Never Used  Substance and Sexual Activity   Alcohol use: Yes    Alcohol/week: 0.0 standard drinks of alcohol    Comment: maybe twice a month   Drug use: No   Sexual activity: Not on file

## 2023-12-24 ENCOUNTER — Other Ambulatory Visit: Payer: Self-pay | Admitting: Family Medicine

## 2024-01-03 ENCOUNTER — Encounter: Payer: Self-pay | Admitting: Orthopedic Surgery

## 2024-01-03 ENCOUNTER — Ambulatory Visit: Admitting: Orthopedic Surgery

## 2024-01-03 DIAGNOSIS — R202 Paresthesia of skin: Secondary | ICD-10-CM | POA: Diagnosis not present

## 2024-01-03 DIAGNOSIS — M25532 Pain in left wrist: Secondary | ICD-10-CM | POA: Diagnosis not present

## 2024-01-03 DIAGNOSIS — M25531 Pain in right wrist: Secondary | ICD-10-CM

## 2024-01-04 ENCOUNTER — Encounter: Payer: Self-pay | Admitting: Orthopedic Surgery

## 2024-01-04 NOTE — Progress Notes (Signed)
 Office Visit Note   Patient: [name] [name]           Date of Birth: [birth year]           MRN: [id number] Visit Date: 01/03/2024 Requested by: Donley Furth, MD [address] [place],  [place] [address] PCP: Donley Furth, MD  Subjective: Chief Complaint  Patient presents with   Left Hand - Pain   Right Knee - Pain    HPI: [name] [name] is a 67 y.o. female who presents to the office reporting numbness and tingling in the right hand.  Patient had a left CMC injection which helped.  Doing reasonly well with her knee.  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  She has known history of severe carpal tunnel syndrome which she has been managing without surgery for many months..                ROS: All systems reviewed are negative as they relate to the chief complaint within the history of present illness.  Patient denies fevers or chills.  Assessment & Plan: Visit Diagnoses:  1. Paresthesia of skin   2. Bilateral wrist pain     Plan: Impression is right carpal tunnel surgery pending for July.  She may consider working part-time after that surgery.  Risk and benefits are discussed include not limited to infection nerve vessel damage incomplete pain relief as well as incomplete restoration of function.  Patient also has bilateral CMC arthritis.  Plan at this time for that is continued injections as needed.  All questions answered  Follow-Up Instructions: No follow-ups on file.   Orders:  No orders of the defined types were placed in this encounter.  No orders of the defined types were placed in this encounter.     Procedures: No procedures performed   Clinical Data: No additional findings.  Objective: Vital Signs: There were no vitals taken for this visit.  Physical Exam:  Constitutional: Patient appears well-developed HEENT:  Head: Normocephalic Eyes:EOM are normal Neck: Normal range of motion Cardiovascular: Normal rate Pulmonary/chest: Effort normal Neurologic:  Patient is alert Skin: Skin is warm Psychiatric: Patient has normal mood and affect  Ortho Exam: Ortho exam demonstrates full active and passive range of motion of the wrist.  Does have positive grind test bilaterally.  Radial pulse intact.  Does have positive carpal tunnel compression testing on the right compared to the left.  Negative Tinel's cubital tunnel.  Right knee has range of motion of about 0-90 with no effusion no warmth.  Specialty Comments:  No specialty comments available.  Imaging: No results found.   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   COVID-19 virus infection 04/09/2021   Dyslipidemia 03/31/2021   Urinary frequency 10/29/2017   Diabetes mellitus without complication (HCC) 09/14/2014   Special screening for malignant neoplasms, colon 11/25/2011   Diverticulosis of colon 11/25/2011   URTICARIA 01/31/2010   ECZEMA 05/03/2009   Past Medical History:  Diagnosis Date   Arthritis    Complication of anesthesia    Diabetes mellitus without complication (HCC)    Gallstones  Hypertension    PONV (postoperative nausea and vomiting)    Ruptured ovarian cyst     Family History  Problem Relation Age of Onset   Cancer Other        breast cancer   Colon cancer Neg Hx    Esophageal cancer Neg Hx    Stomach cancer Neg Hx    Rectal cancer Neg Hx     Past Surgical History:  Procedure Laterality Date   CESAREAN SECTION     COLONOSCOPY  06/05/2019   per Dr. Savannah Curlin, single adenomatous polyp, repeat in 7 yrs   KNEE ARTHROPLASTY Right 08/27/2021   Procedure: COMPUTER  ASSISTED TOTAL KNEE ARTHROPLASTY;  Surgeon: Adonica Hoose, MD;  Location: [facility];  Service: Orthopedics;  Laterality: Right;   KNEE CLOSED REDUCTION Right 11/20/2021   Procedure: CLOSED MANIPULATION KNEE;  Surgeon: Adonica Hoose, MD;  Location: [facility];  Service: Orthopedics;  Laterality: Right;   lumpectomy,benign, right breast     x2   RIGHT SHOULDER SURGERY      TOTAL KNEE REVISION Right 02/02/2023   Procedure: REVISION RIGHT TOTAL KNEE ARTHROPLASTY;  Surgeon: Jasmine Mesi, MD;  Location: [facility];  Service: Orthopedics;  Laterality: Right;   Social History   Occupational History   Occupation: food service  Tobacco Use   Smoking status: Never    Passive exposure: Never   Smokeless tobacco: Never  Vaping Use   Vaping status: Never Used  Substance and Sexual Activity   Alcohol  use: Yes    Alcohol /week: 0.0 standard drinks of alcohol     Comment: maybe twice a month   Drug use: No   Sexual activity: Not on file

## 2024-01-11 ENCOUNTER — Encounter: Payer: Self-pay | Admitting: Gastroenterology

## 2024-01-18 ENCOUNTER — Ambulatory Visit: Admitting: Gastroenterology

## 2024-01-18 ENCOUNTER — Encounter: Payer: Self-pay | Admitting: Gastroenterology

## 2024-01-18 VITALS — BP 128/64 | HR 71 | Temp 98.0°F | Resp 14 | Ht 60.0 in | Wt 144.8 lb

## 2024-01-18 DIAGNOSIS — D509 Iron deficiency anemia, unspecified: Secondary | ICD-10-CM

## 2024-01-18 DIAGNOSIS — K2950 Unspecified chronic gastritis without bleeding: Secondary | ICD-10-CM | POA: Diagnosis present

## 2024-01-18 MED ORDER — SODIUM CHLORIDE 0.9 % IV SOLN: 500.0000 mL | Freq: Once | INTRAVENOUS | Status: DC

## 2024-01-18 NOTE — Progress Notes (Signed)
 Report to PACU, RN, vss, BBS= Clear.

## 2024-01-18 NOTE — Patient Instructions (Signed)
  Resume previous diet  Continue present medications  Awaiting pathology results   YOU HAD AN ENDOSCOPIC PROCEDURE TODAY AT THE [facility]:   Refer to the procedure report that was given to you for any specific questions about what was found during the examination.  If the procedure report does not answer your questions, please call your gastroenterologist to clarify.  If you requested that your care partner not be given the details of your procedure findings, then the procedure report has been included in a sealed envelope for you to review at your convenience later.  YOU SHOULD EXPECT: Some feelings of bloating in the abdomen. Passage of more gas than usual.  Walking can help get rid of the air that was put into your GI tract during the procedure and reduce the bloating. If you had a lower endoscopy (such as a colonoscopy or flexible sigmoidoscopy) you may notice spotting of blood in your stool or on the toilet paper. If you underwent a bowel prep for your procedure, you may not have a normal bowel movement for a few days.  Please Note:  You might notice some irritation and congestion in your nose or some drainage.  This is from the oxygen used during your procedure.  There is no need for concern and it should clear up in a day or so.  SYMPTOMS TO REPORT IMMEDIATELY:  Following upper endoscopy (EGD)  Vomiting of blood or coffee ground material  New chest pain or pain under the shoulder blades  Painful or persistently difficult swallowing  New shortness of breath  Fever of 100F or higher  Black, tarry-looking stools  For urgent or emergent issues, a gastroenterologist can be reached at any hour by calling [phone]. Do not use MyChart messaging for urgent concerns.    DIET:  We do recommend a small meal at first, but then you may proceed to your regular diet.  Drink plenty of fluids but you should avoid alcoholic beverages for 24 hours.  ACTIVITY:  You should plan to  take it easy for the rest of today and you should NOT DRIVE or use heavy machinery until tomorrow (because of the sedation medicines used during the test).    FOLLOW UP: Our staff will call the number listed on your records the next business day following your procedure.  We will call around 7:15- 8:00 am to check on you and address any questions or concerns that you may have regarding the information given to you following your procedure. If we do not reach you, we will leave a message.     If any biopsies were taken you will be contacted by phone or by letter within the next 1-3 weeks.  Please call us at [phone] if you have not heard about the biopsies in 3 weeks.    SIGNATURES/CONFIDENTIALITY: You and/or your care partner have signed paperwork which will be entered into your electronic medical record.  These signatures attest to the fact that that the information above on your After Visit Summary has been reviewed and is understood.  Full responsibility of the confidentiality of this discharge information lies with you and/or your care-partner.

## 2024-01-18 NOTE — Progress Notes (Signed)
 GASTROENTEROLOGY PROCEDURE H&P NOTE   Primary Care Physician: Donley Furth, MD    Reason for Procedure:   Iron  deficiency anemia  Plan:    EGD  Patient is appropriate for endoscopic procedure(s) in the ambulatory ([facility]) setting.  The nature of the procedure, as well as the risks, benefits, and alternatives were carefully and thoroughly reviewed with the patient. Ample time for discussion and questions allowed. The patient understood, was satisfied, and agreed to proceed.     HPI: [name] [name] is a 68 y.o. female who presents for EGD for evaluation of IDA.   Past Medical History:  Diagnosis Date   Arthritis    Complication of anesthesia    Diabetes mellitus without complication (HCC)    Gallstones    Hypertension    PONV (postoperative nausea and vomiting)    Ruptured ovarian cyst     Past Surgical History:  Procedure Laterality Date   CESAREAN SECTION     COLONOSCOPY  06/05/2019   per Dr. Savannah Curlin, single adenomatous polyp, repeat in 7 yrs   KNEE ARTHROPLASTY Right 08/27/2021   Procedure: COMPUTER ASSISTED TOTAL KNEE ARTHROPLASTY;  Surgeon: Adonica Hoose, MD;  Location: [facility];  Service: Orthopedics;  Laterality: Right;   KNEE CLOSED REDUCTION Right 11/20/2021   Procedure: CLOSED MANIPULATION KNEE;  Surgeon: Adonica Hoose, MD;  Location: [facility];  Service: Orthopedics;  Laterality: Right;   lumpectomy,benign, right breast     x2   RIGHT SHOULDER SURGERY      TOTAL KNEE REVISION Right 02/02/2023   Procedure: REVISION RIGHT TOTAL KNEE ARTHROPLASTY;  Surgeon: Jasmine Mesi, MD;  Location: [facility];  Service: Orthopedics;  Laterality: Right;    Prior to Admission medications   Medication Sig Start Date End Date Taking? Authorizing Provider  BAYER LOW DOSE 81 MG chewable tablet CHEW 1 TABLET (81 MG TOTAL) BY MOUTH 2 (TWO) TIMES DAILY. 03/03/23   Magnant, Charles L, PA-C  calcium  carbonate (OSCAL) 1500 (600 Ca) MG TABS tablet Take by mouth 2 (two) times daily with  a meal.    [provider]  cholecalciferol (VITAMIN D3) 25 MCG (1000 UNIT) tablet Take 2,000 Units by mouth daily.    [provider]  Iron , Ferrous Sulfate , 325 (65 Fe) MG TABS Take 325 mg by mouth daily. 09/03/23   Kennedy-Smith, Colleen M, NP  losartan  (COZAAR ) 50 MG tablet TAKE ONE TABLET BY MOUTH ONE TIME DAILY 12/29/23   Donley Furth, MD  metFORMIN  (GLUCOPHAGE ) 1000 MG tablet Take 0.5 tablets (500 mg total) by mouth 2 (two) times daily with a meal. 01/22/23   Donley Furth, MD  methocarbamol  (ROBAXIN ) 500 MG tablet Take 1 tablet (500 mg total) by mouth every 8 (eight) hours as needed for muscle spasms. Patient not taking: Reported on 01/18/2024 02/03/23   Magnant, Justice Olp, PA-C  Omega-3 Fatty Acids (FISH OIL PO) Take 1 capsule by mouth daily.    [provider]  triamcinolone  cream (KENALOG ) 0.1 % Apply 1 Application topically 2 (two) times daily. 04/19/23   Donley Furth, MD    Current Outpatient Medications  Medication Sig Dispense Refill   BAYER LOW DOSE 81 MG chewable tablet CHEW 1 TABLET (81 MG TOTAL) BY MOUTH 2 (TWO) TIMES DAILY. 60 tablet 0   calcium  carbonate (OSCAL) 1500 (600 Ca) MG TABS tablet Take by mouth 2 (two) times daily with a meal.     cholecalciferol (VITAMIN D3) 25 MCG (1000 UNIT) tablet Take 2,000 Units by  mouth daily.     Iron , Ferrous Sulfate , 325 (65 Fe) MG TABS Take 325 mg by mouth daily. 30 tablet 1   losartan  (COZAAR ) 50 MG tablet TAKE ONE TABLET BY MOUTH ONE TIME DAILY 90 tablet 0   metFORMIN  (GLUCOPHAGE ) 1000 MG tablet Take 0.5 tablets (500 mg total) by mouth 2 (two) times daily with a meal. 180 tablet 3   methocarbamol  (ROBAXIN ) 500 MG tablet Take 1 tablet (500 mg total) by mouth every 8 (eight) hours as needed for muscle spasms. (Patient not taking: Reported on 01/18/2024) 30 tablet 0   Omega-3 Fatty Acids (FISH OIL PO) Take 1 capsule by mouth daily.     triamcinolone  cream (KENALOG ) 0.1 % Apply 1 Application topically 2 (two)  times daily. 45 g 5   Current Facility-Administered Medications  Medication Dose Route Frequency Provider Last Rate Last Admin   0.9 %  sodium chloride  infusion  500 mL Intravenous Once Nicola Heinemann V, DO        Allergies as of 01/18/2024 - Review Complete 01/18/2024  Allergen Reaction Noted   Hydrocodone  bit-homatrop mbr Nausea And Vomiting 08/24/2014    Family History  Problem Relation Age of Onset   Cancer Other        breast cancer   Colon cancer Neg Hx    Esophageal cancer Neg Hx    Stomach cancer Neg Hx    Rectal cancer Neg Hx    Colon polyps Neg Hx     Social History   Socioeconomic History   Marital status: Married    Spouse name: Not on file   Number of children: 1   Years of education: Not on file   Highest education level: Not on file  Occupational History   Occupation: food service  Tobacco Use   Smoking status: Never    Passive exposure: Never   Smokeless tobacco: Never  Vaping Use   Vaping status: Never Used  Substance and Sexual Activity   Alcohol  use: Yes    Alcohol /week: 0.0 standard drinks of alcohol     Comment: maybe twice a month   Drug use: No   Sexual activity: Not on file  Other Topics Concern   Not on file  Social History Narrative   Married   Never Smoker   Alcohol  use-yes   Drug use-no         Social Drivers of Health   Financial Resource Strain: Not on file  Food Insecurity: No Food Insecurity (02/03/2023)   Hunger Vital Sign    Worried About Running Out of Food in the Last Year: Never true    Ran Out of Food in the Last Year: Never true  Transportation Needs: No Transportation Needs (02/03/2023)   PRAPARE - Administrator, Civil Service (Medical): No    Lack of Transportation (Non-Medical): No  Physical Activity: Not on file  Stress: Not on file  Social Connections: Not on file  Intimate Partner Violence: Not At Risk (02/03/2023)   Humiliation, Afraid, Rape, and Kick questionnaire    Fear of Current or  Ex-Partner: No    Emotionally Abused: No    Physically Abused: No    Sexually Abused: No    Physical Exam: Vital signs in last 24 hours: @BP  133/70   Pulse 73   Temp 98 F (36.7 C)   Ht 5' (1.524 m)   Wt 144 lb 12.8 oz (65.7 kg)   SpO2 96%   BMI 28.28 kg/m  GEN: NAD  EYE: Sclerae anicteric ENT: MMM CV: Non-tachycardic Pulm: CTA b/l GI: Soft, NT/ND NEURO:  Alert & Oriented x 3   Harry Lindau, DO [facility]   01/18/2024 2:46 PM

## 2024-01-18 NOTE — Op Note (Signed)
 [facility] Patient Name: [name] [name] Procedure Date: 01/18/2024 2:46 PM MRN: [id number] Endoscopist: Harry Lindau , MD, [id number] Age: 68 Referring MD:  Date of Birth: [birth year] Gender: Female Account #: [id number] Procedure:                Upper GI endoscopy Indications:              Iron  deficiency anemia Medicines:                Monitored Anesthesia Care Procedure:                Pre-Anesthesia Assessment:                           - Prior to the procedure, a History and Physical                            was performed, and patient medications and                            allergies were reviewed. The patient's tolerance of                            previous anesthesia was also reviewed. The risks                            and benefits of the procedure and the sedation                            options and risks were discussed with the patient.                            All questions were answered, and informed consent                            was obtained. Prior Anticoagulants: The patient has                            taken no anticoagulant or antiplatelet agents. ASA                            Grade Assessment: II - A patient with mild systemic                            disease. After reviewing the risks and benefits,                            the patient was deemed in satisfactory condition to                            undergo the procedure.                           After obtaining informed consent, the endoscope was  passed under direct vision. Throughout the                            procedure, the patient's blood pressure, pulse, and                            oxygen saturations were monitored continuously. The                            GIF [id number] #[id number] was introduced through the                            mouth, and advanced to the second part of duodenum.                            The upper GI endoscopy was  accomplished without                            difficulty. The patient tolerated the procedure                            well. Scope In: Scope Out: Findings:                 The examined esophagus was normal.                           The Z-line was regular and was found 35 cm from the                            incisors.                           The entire examined stomach was normal. Biopsies                            were taken with a cold forceps for Helicobacter                            pylori testing. Estimated blood loss was minimal.                           The examined duodenum was normal. Biopsies were                            taken with a cold forceps for histology. Estimated                            blood loss was minimal. Complications:            No immediate complications. Estimated Blood Loss:     Estimated blood loss was minimal. Impression:               - Normal esophagus.                           -  Z-line regular, 35 cm from the incisors.                           - Normal stomach. Biopsied.                           - Normal examined duodenum. Biopsied. Recommendation:           - Patient has a contact number available for                            emergencies. The signs and symptoms of potential                            delayed complications were discussed with the                            patient. Return to normal activities tomorrow.                            Written discharge instructions were provided to the                            patient.                           - Resume previous diet.                           - Continue present medications.                           - Await pathology results.                           - If continued or recurrent iron  deficiency anemia,                            can consider further small bowel interrogation with                            Video Capsule Endoscopy (VCE). Harry Lindau,  MD 01/18/2024 3:03:44 PM

## 2024-01-18 NOTE — Progress Notes (Signed)
 Pt's states no medical or surgical changes since previsit or office visit.

## 2024-01-18 NOTE — Progress Notes (Signed)
 Called to room to assist during endoscopic procedure.  Patient ID and intended procedure confirmed with present staff. Received instructions for my participation in the procedure from the performing physician.

## 2024-01-19 ENCOUNTER — Telehealth: Payer: Self-pay

## 2024-01-19 NOTE — Telephone Encounter (Signed)
  Follow up Call-     01/18/2024    2:13 PM 08/30/2023    8:01 AM  Call back number  Post procedure Call Back phone  # [phone]  Permission to leave phone message Yes Yes     Patient questions:  Do you have a fever, pain , or abdominal swelling? No. Pain Score  0 *  Have you tolerated food without any problems? Yes.    Have you been able to return to your normal activities? Yes.    Do you have any questions about your discharge instructions: Diet   No. Medications  No. Follow up visit  No.  Do you have questions or concerns about your Care? No.  Actions: * If pain score is 4 or above: No action needed, pain <4.

## 2024-01-24 LAB — SURGICAL PATHOLOGY

## 2024-01-26 ENCOUNTER — Ambulatory Visit: Payer: Self-pay | Admitting: Gastroenterology

## 2024-02-04 ENCOUNTER — Telehealth: Payer: Self-pay | Admitting: Orthopedic Surgery

## 2024-02-04 NOTE — Telephone Encounter (Signed)
 Patient would like to have surgery on her hand.

## 2024-02-07 NOTE — Telephone Encounter (Signed)
Blue sheet done thx

## 2024-02-09 ENCOUNTER — Ambulatory Visit: Admitting: Family Medicine

## 2024-02-09 ENCOUNTER — Encounter: Payer: Self-pay | Admitting: Family Medicine

## 2024-02-09 VITALS — BP 128/74 | HR 84 | Temp 98.5°F | Wt 149.8 lb

## 2024-02-09 DIAGNOSIS — M5441 Lumbago with sciatica, right side: Secondary | ICD-10-CM | POA: Diagnosis not present

## 2024-02-09 DIAGNOSIS — E119 Type 2 diabetes mellitus without complications: Secondary | ICD-10-CM | POA: Diagnosis not present

## 2024-02-09 DIAGNOSIS — G8929 Other chronic pain: Secondary | ICD-10-CM

## 2024-02-09 LAB — POCT GLYCOSYLATED HEMOGLOBIN (HGB A1C): Hemoglobin A1C: 7.3 % — AB (ref 4.0–5.6)

## 2024-02-09 MED ORDER — MELOXICAM 7.5 MG PO TABS: 7.5000 mg | ORAL_TABLET | Freq: Every day | ORAL | 5 refills | Status: AC

## 2024-02-09 NOTE — Progress Notes (Signed)
   Subjective:    Patient ID: [name] [name], female    DOB: [birth year], 68 y.o.   MRN: [id number]  HPI Here for 5 days of sharp pains in the right lower back that sometimes radiate down the right leg. She has had these in the past, but they do not usually last this long. She has been seeing Dr. Lynita Saris for knee pain, and she mentioned the back pain. He sent her for a lumbar spine MRI on 01-09-24. This showed only mild degenerative changes but nothing of a surgical nature. She says Tylenol  helps a little, but Meloxicam  helps more.    Review of Systems  Constitutional: Negative.   Respiratory: Negative.    Cardiovascular: Negative.   Musculoskeletal:  Positive for back pain.       Objective:   Physical Exam Constitutional:      Appearance: Normal appearance.  Cardiovascular:     Rate and Rhythm: Normal rate and regular rhythm.     Pulses: Normal pulses.     Heart sounds: Normal heart sounds.  Pulmonary:     Effort: Pulmonary effort is normal.     Breath sounds: Normal breath sounds.  Musculoskeletal:     Comments: She is tender along the right side of the lower back. No sciatic notch tenderness. ROM is somewhat limited by pain. Negative SLR  Neurological:     Mental Status: She is alert.           Assessment & Plan:  Low back pain. We will treat her with a combination of Meloxicam  7.5 mg daily and Tylenol  1000 mg TID. We will also refer her to PT.  Corita Diego, MD

## 2024-03-03 NOTE — Therapy (Signed)
 OUTPATIENT PHYSICAL THERAPY THORACOLUMBAR EVALUATION   Patient Name: [name] [name] MRN: [id number] DOB:07/17/1956, 68 y.o., female Today's Date: 03/06/2024  END OF SESSION:  PT End of Session - 03/06/24 1051     Visit Number 1    Date for PT Re-Evaluation 05/01/24    Authorization Type [employer]et[employer]    PT Start Time 1012    PT Stop Time 1054    PT Time Calculation (min) 42 min    Activity Tolerance Patient tolerated treatment well    Behavior During Therapy WFL for tasks assessed/performed          Past Medical History:  Diagnosis Date   Arthritis    Complication of anesthesia    Diabetes mellitus without complication (HCC)    Gallstones    Hypertension    PONV (postoperative nausea and vomiting)    Ruptured ovarian cyst    Past Surgical History:  Procedure Laterality Date   CESAREAN SECTION     COLONOSCOPY  06/05/2019   per Dr. Eda, single adenomatous polyp, repeat in 7 yrs   KNEE ARTHROPLASTY Right 08/27/2021   Procedure: COMPUTER ASSISTED TOTAL KNEE ARTHROPLASTY;  Surgeon: Fidel Rogue, MD;  Location: [facility];  Service: Orthopedics;  Laterality: Right;   KNEE CLOSED REDUCTION Right 11/20/2021   Procedure: CLOSED MANIPULATION KNEE;  Surgeon: Fidel Rogue, MD;  Location: [facility];  Service: Orthopedics;  Laterality: Right;   lumpectomy,benign, right breast     x2   RIGHT SHOULDER SURGERY      TOTAL KNEE REVISION Right 02/02/2023   Procedure: REVISION RIGHT TOTAL KNEE ARTHROPLASTY;  Surgeon: Addie Cordella Hamilton, MD;  Location: [facility] OR;  Service: Orthopedics;  Laterality: Right;   Patient Active Problem List   Diagnosis Date Noted   Hepatic steatosis 06/01/2023   Elevated alkaline phosphatase level 06/01/2023   Pain due to total right knee replacement (HCC) 02/07/2023   S/P revision of total knee, right 02/02/2023   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   Primary hypertension 03/09/2022   Osteoarthritis of right knee 08/27/2021   S/P total knee  arthroplasty, 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   COVID-19 virus infection 04/09/2021   Dyslipidemia 03/31/2021   Urinary frequency 10/29/2017   Diabetes mellitus without complication (HCC) 09/14/2014   Special screening for malignant neoplasms, colon 11/25/2011   Diverticulosis of colon 11/25/2011   URTICARIA 01/31/2010   ECZEMA 05/03/2009    PCP: Johnny Senior, MD  REFERRING PROVIDER: Johnny Senior. MD  REFERRING DIAG:  Diagnosis  M54.41,G89.29 (ICD-10-CM) - Chronic right-sided low back pain with right-sided sciatica    Rationale for Evaluation and Treatment: Rehabilitation  THERAPY DIAG:  Chronic right-sided low back pain with right-sided sciatica - Plan: PT plan of care cert/re-cert  Other low back pain - Plan: PT plan of care cert/re-cert  Cramp and spasm - Plan: PT plan of care cert/re-cert  ONSET DATE: chronic with flare up in June 2025  SUBJECTIVE:  SUBJECTIVE STATEMENT: I have had a 2-3 year history of LBP that has been intermittent.  Pt reports that each time she has a flare-up it is worse. Pt had recent MRI that showed mild DDD per MD note.     From MD note on 02/09/24:  Here for 5 days of sharp pains in the right lower back that sometimes radiate down the right leg. She has had these in the past, but they do not usually last this long. She has been seeing Dr. Glendia Hutchinson for knee pain, and she mentioned the back pain. He sent her for a lumbar spine MRI on 01-09-24. This showed only mild degenerative changes but nothing of a surgical nature. She says Tylenol  helps a little, but Meloxicam  helps more.     PERTINENT HISTORY:  Rt TKA 08/2021 with revision 2024, Rt total shoulder replacement 2020  PAIN: 03/06/24 Are you having pain? Yes: NPRS scale: 0-2/10 Pain location: bil low back into gluteals Pain  description: constant, sharp aching Aggravating factors: early in the day at work, with movement  Relieving factors: wearing a brace, rest, oral meds  PRECAUTIONS: None  RED FLAGS: None   WEIGHT BEARING RESTRICTIONS: No  FALLS:  Has patient fallen in last 6 months? No  LIVING ENVIRONMENT: Lives with: lives with their family Lives in: House/apartment Stairs: No Has following equipment at home: None  OCCUPATION: works at [employer], 8 hour shifts.  Repetitive motion, lifting, standing  PLOF: Independent, Vocation/Vocational requirements: [employer]- see above for description, and Leisure: walk and bike for exercise   PATIENT GOALS: reduce pain, work with improved mechanics   NEXT MD VISIT: none   OBJECTIVE:  Note: Objective measures were completed at Evaluation unless otherwise noted.  DIAGNOSTIC FINDINGS:  MRI 01/09/23:  IMPRESSION: Mild degenerative changes of the lower lumbar spine. No significant spinal canal or neural foraminal stenosis at any level.  PATIENT SURVEYS:  03/06/24: Modified Oswestry:      Minimally Clinically Important Difference (MCID) = 12.8%  COGNITION: Overall cognitive status: Within functional limits for tasks assessed     SENSATION: WFL    POSTURE: forward head  PALPATION: Reduced lumbar segmental mobility, tension without pain over bil lumbar paraspinals and gluteals.   LUMBAR ROM:  Limited side bending by 25% bil with pain, all other ROM is full.   LOWER EXTREMITY ROM:    Hip flexibility is limited by 25% without pain.  Rt knee A/ROM limited due to TKA revision  LOWER EXTREMITY MMT:    Bil hip 4 to 4+/5, knee 4+/5   GAIT: WNLs   TREATMENT  DATE: 03/06/24:  Findings from evaluation discussed, pt educated on plan of care, HEP initiated.                                                                                                                                  PATIENT EDUCATION:  Education details: Access Code:  [email or web address] Person educated:  Patient Education method: Explanation, Demonstration, and Handouts Education comprehension: verbalized understanding and returned demonstration  HOME EXERCISE PROGRAM: Access Code: [email or web address] URL: [email or web address] Date: 03/06/2024 Prepared by: Burnard  Exercises - Supine Lower Trunk Rotation  - 1 x daily - 7 x weekly - 1 sets - 3 reps - 20 hold - Hooklying Single Knee to Chest  - 3 x daily - 7 x weekly - 1 sets - 3 reps - 20 hold - Seated Hamstring Stretch  - 3 x daily - 7 x weekly - 1 sets - 3 reps - 20 hold - Seated Piriformis Stretch with Trunk Bend  - 3 x daily - 7 x weekly - 1 sets - 3 reps - 20 hold - Seated Transversus Abdominis Bracing  - 2 x daily - 7 x weekly - 1 sets - 10 reps  ASSESSMENT:  CLINICAL IMPRESSION: Patient is a 68 y.o. female who was seen today for physical therapy evaluation and treatment for LBP. She reports a chronic history with intermittent flare-ups.  She works at [employer] full time and is required to stand, lift, and [employer].  She reports that pain has been 1-2/10 most recently and is localized to her bil low back and gluteals.  Pt with limited lumbar A/ROM and segmental mobility with tension in bil lumbar paraspinals and gluteals. Patient will benefit from skilled PT to address the below impairments and improve overall function.    OBJECTIVE IMPAIRMENTS: Abnormal gait, decreased activity tolerance, decreased balance, decreased endurance, decreased ROM, decreased strength, hypomobility, increased muscle spasms, impaired flexibility, improper body mechanics, and pain.   ACTIVITY LIMITATIONS: carrying, lifting, standing, squatting, transfers, and locomotion level  PARTICIPATION LIMITATIONS: cleaning, laundry, shopping, community activity, and occupation  PERSONAL FACTORS: 1 comorbidity: lumbar DDD are also affecting patient's functional outcome.   REHAB POTENTIAL: Good  CLINICAL DECISION MAKING:  Stable/uncomplicated  EVALUATION COMPLEXITY: Low   GOALS: Goals reviewed with patient? Yes  SHORT TERM GOALS: Target date: 04/03/2024    Be independent in initial HEP Baseline: Goal status: INITIAL  2. Report > or = to 30% reduction in LBP with work tasks   Goal status: INITIAL  3.  Verbalize body mechanics modifications for safety with work tasks  Baseline:  Goal status: INITIAL     LONG TERM GOALS: Target date: 05/01/2024    Be independent in advanced HEP Baseline:  Goal status: INITIAL  2.   Improve Modified Oswestry to < or = to 10% disability (5/50) Baseline:  Goal status: INITIAL  3.  Report > or = to 60% reduction in LBP with work tasks  Baseline:  Goal status: INITIAL   PLAN:  PT FREQUENCY: 1-2x/week  PT DURATION: 8 weeks  PLANNED INTERVENTIONS: 97110-Therapeutic exercises, 97530- Therapeutic activity, [id number]- Neuromuscular re-education, 97535- Self Care, 02859- Manual therapy, [id number]- Gait training, [phone]- Canalith repositioning, [id number]- Aquatic Therapy, [phone]- Electrical stimulation (unattended), [phone]- Electrical stimulation (manual), [id number]- Ultrasound, [id number]- Traction (mechanical), [id number] (1-2 muscles), [id number] (3+ muscles)- Dry Needling, Patient/Family education, Balance training, Taping, Joint mobilization, Joint manipulation, Spinal manipulation, Spinal mobilization, Vestibular training, Cryotherapy, and Moist heat.  PLAN FOR NEXT SESSION: body mechanics modifications for work tasks, TA activation, core and hip strength, review flexibility exercises   Burnard Joy, PT 03/06/24 11:30 AM   [facility] [address] [place], [place] [address] Phone # [phone] Fax [phone]

## 2024-03-06 ENCOUNTER — Other Ambulatory Visit: Payer: Self-pay

## 2024-03-06 ENCOUNTER — Ambulatory Visit: Attending: Family Medicine

## 2024-03-06 DIAGNOSIS — M5441 Lumbago with sciatica, right side: Secondary | ICD-10-CM | POA: Insufficient documentation

## 2024-03-06 DIAGNOSIS — G8929 Other chronic pain: Secondary | ICD-10-CM | POA: Insufficient documentation

## 2024-03-06 DIAGNOSIS — M5459 Other low back pain: Secondary | ICD-10-CM | POA: Diagnosis present

## 2024-03-06 DIAGNOSIS — R252 Cramp and spasm: Secondary | ICD-10-CM | POA: Insufficient documentation

## 2024-03-09 ENCOUNTER — Encounter: Admitting: Surgical

## 2024-04-03 ENCOUNTER — Ambulatory Visit: Attending: Family Medicine | Admitting: Physical Therapy

## 2024-04-03 ENCOUNTER — Encounter: Payer: Self-pay | Admitting: Physical Therapy

## 2024-04-03 DIAGNOSIS — M5441 Lumbago with sciatica, right side: Secondary | ICD-10-CM | POA: Insufficient documentation

## 2024-04-03 DIAGNOSIS — M5459 Other low back pain: Secondary | ICD-10-CM | POA: Insufficient documentation

## 2024-04-03 DIAGNOSIS — G8929 Other chronic pain: Secondary | ICD-10-CM | POA: Insufficient documentation

## 2024-04-03 DIAGNOSIS — R252 Cramp and spasm: Secondary | ICD-10-CM | POA: Diagnosis present

## 2024-04-03 NOTE — Therapy (Addendum)
 OUTPATIENT PHYSICAL THERAPY THORACOLUMBAR TREATMENT   Patient Name: [name] [name] MRN: [id number] DOB:14-Dec-1955, 68 y.o., female Today's Date: 04/03/2024  END OF SESSION:  PT End of Session - 04/03/24 0839     Visit Number 2    Date for PT Re-Evaluation 05/01/24    Authorization Type Aetna    PT Start Time 0840    PT Stop Time 0925    PT Time Calculation (min) 45 min    Activity Tolerance Patient tolerated treatment well    Behavior During Therapy WFL for tasks assessed/performed          Past Medical History:  Diagnosis Date   Arthritis    Complication of anesthesia    Diabetes mellitus without complication (HCC)    Gallstones    Hypertension    PONV (postoperative nausea and vomiting)    Ruptured ovarian cyst    Past Surgical History:  Procedure Laterality Date   CESAREAN SECTION     COLONOSCOPY  06/05/2019   per Dr. Eda, single adenomatous polyp, repeat in 7 yrs   KNEE ARTHROPLASTY Right 08/27/2021   Procedure: COMPUTER ASSISTED TOTAL KNEE ARTHROPLASTY;  Surgeon: Fidel Rogue, MD;  Location: [facility];  Service: Orthopedics;  Laterality: Right;   KNEE CLOSED REDUCTION Right 11/20/2021   Procedure: CLOSED MANIPULATION KNEE;  Surgeon: Fidel Rogue, MD;  Location: [facility];  Service: Orthopedics;  Laterality: Right;   lumpectomy,benign, right breast     x2   RIGHT SHOULDER SURGERY      TOTAL KNEE REVISION Right 02/02/2023   Procedure: REVISION RIGHT TOTAL KNEE ARTHROPLASTY;  Surgeon: Addie Cordella Hamilton, MD;  Location: [facility] OR;  Service: Orthopedics;  Laterality: Right;   Patient Active Problem List   Diagnosis Date Noted   Hepatic steatosis 06/01/2023   Elevated alkaline phosphatase level 06/01/2023   Pain due to total right knee replacement (HCC) 02/07/2023   S/P revision of total knee, right 02/02/2023   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   Primary hypertension 03/09/2022   Osteoarthritis of right knee 08/27/2021   S/P total knee  arthroplasty, 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   COVID-19 virus infection 04/09/2021   Dyslipidemia 03/31/2021   Urinary frequency 10/29/2017   Diabetes mellitus without complication (HCC) 09/14/2014   Special screening for malignant neoplasms, colon 11/25/2011   Diverticulosis of colon 11/25/2011   URTICARIA 01/31/2010   ECZEMA 05/03/2009    PCP: Johnny Senior, MD  REFERRING PROVIDER: Johnny Senior. MD  REFERRING DIAG:  Diagnosis  M54.41,G89.29 (ICD-10-CM) - Chronic right-sided low back pain with right-sided sciatica    Rationale for Evaluation and Treatment: Rehabilitation  THERAPY DIAG:  Chronic right-sided low back pain with right-sided sciatica  Other low back pain  Cramp and spasm  ONSET DATE: chronic with flare up in June 2025  SUBJECTIVE:  SUBJECTIVE STATEMENT: Sometimes it's okay sometimes bad. Today 3/10 in the low back and into the R glute med area.   From MD note on 02/09/24:  Here for 5 days of sharp pains in the right lower back that sometimes radiate down the right leg. She has had these in the past, but they do not usually last this long. She has been seeing Dr. Glendia Hutchinson for knee pain, and she mentioned the back pain. He sent her for a lumbar spine MRI on 01-09-24. This showed only mild degenerative changes but nothing of a surgical nature. She says Tylenol  helps a little, but Meloxicam  helps more.     PERTINENT HISTORY:  Rt TKA 08/2021 with revision 2024, Rt total shoulder replacement 2020  PAIN: 03/06/24 Are you having pain? Yes: NPRS scale: 3/10 Pain location: bil low back into gluteals Pain description: constant, sharp aching Aggravating factors: early in the day at work, with movement  Relieving factors: wearing a brace, rest, oral meds  PRECAUTIONS: None  RED  FLAGS: None   WEIGHT BEARING RESTRICTIONS: No  FALLS:  Has patient fallen in last 6 months? No  LIVING ENVIRONMENT: Lives with: lives with their family Lives in: House/apartment Stairs: No Has following equipment at home: None  OCCUPATION: works at [employer], 8 hour shifts.  Repetitive motion, lifting, standing  PLOF: Independent, Vocation/Vocational requirements: [employer]- see above for description, and Leisure: walk and bike for exercise   PATIENT GOALS: reduce pain, work with improved mechanics   NEXT MD VISIT: none   OBJECTIVE:  Note: Objective measures were completed at Evaluation unless otherwise noted.  DIAGNOSTIC FINDINGS:  MRI 01/09/23:  IMPRESSION: Mild degenerative changes of the lower lumbar spine. No significant spinal canal or neural foraminal stenosis at any level.  PATIENT SURVEYS:  03/06/24: Modified Oswestry:      Minimally Clinically Important Difference (MCID) = 12.8%  COGNITION: Overall cognitive status: Within functional limits for tasks assessed     SENSATION: WFL    POSTURE: forward head  PALPATION: Reduced lumbar segmental mobility, tension without pain over bil lumbar paraspinals and gluteals.   LUMBAR ROM:  Limited side bending by 25% bil with pain, all other ROM is full.   LOWER EXTREMITY ROM:    Hip flexibility is limited by 25% without pain.  Rt knee A/ROM limited due to TKA revision  LOWER EXTREMITY MMT:    Bil hip 4 to 4+/5, knee 4+/5   GAIT: WNLs   TREATMENT  DATE: 04/03/24/25:  Nustep L5 x 5 min Prone on elbows Prone press ups 2x10  Standing lumbar ext x 10 and with arms on wall x 5 for options HS seated 2x30 sec B Piriformis seated (modified pigeon on R due to knee restrictions) 2x30 sec LTR and SKTC  Hooklying PPT 5 sec hold x 5, then with march x 5 ea, then with SLR x 5 ea Bridge x 10 Sit to stand x 10 Squat tap to mat x 10 Deadlift 10# KB x 10 to 6 inch step Leg press 60# 2x10 seat 6 (try seat 5 next  time) Reviewed lifting - good return demo from patient    DATE: 03/06/24:  Findings from evaluation discussed, pt educated on plan of care, HEP initiated.  PATIENT EDUCATION:  Education details: Access Code: [email or web address] Person educated: Patient Education method: Explanation, Demonstration, and Handouts Education comprehension: verbalized understanding and returned demonstration  HOME EXERCISE PROGRAM: Access Code: [email or web address] URL: [email or web address] Date: 04/03/2024 Prepared by: Mliss  Exercises - Supine Lower Trunk Rotation  - 1 x daily - 7 x weekly - 1 sets - 3 reps - 20 hold - Hooklying Single Knee to Chest  - 3 x daily - 7 x weekly - 1 sets - 3 reps - 20 hold - Seated Hamstring Stretch  - 3 x daily - 7 x weekly - 1 sets - 3 reps - 20 hold - Seated Piriformis Stretch with Trunk Bend  - 3 x daily - 7 x weekly - 1 sets - 3 reps - 20 hold - Seated Transversus Abdominis Bracing  - 2 x daily - 7 x weekly - 1 sets - 10 reps - Seated Table Piriformis Stretch (Mirrored)  - 2 x daily - 7 x weekly - 1 sets - 3 reps - 30-60 sec hold - Prone Press Up  - 3-4 x daily - 7 x weekly - 1-3 sets - 10 reps - Standing Lumbar Extension  - 1 x daily - 7 x weekly - 2 sets - 10 reps - Standing Lumbar Extension at Wall - Forearms  - 4-5 x daily - 7 x weekly - 2 sets - 10 reps  ASSESSMENT:  CLINICAL IMPRESSION: Patient reports ongoing LBP and R hip pain today. She was able to abolish pain with prone press ups. Standing extension was also given to patient for work and since she is having wrist surgery on Monday. She has excellent form with squats and dead lifts and did not require any cues when demonstrating lifting. She shows core weakness with TA activities and will benefit from ongoing strengthening here. continues to demonstrate potential for improvement and would  benefit from continued skilled therapy to address impairments.     Eval: Patient is a 68 y.o. female who was seen today for physical therapy evaluation and treatment for LBP. She reports a chronic history with intermittent flare-ups.  She works at [employer] full time and is required to stand, lift, and [employer].  She reports that pain has been 1-2/10 most recently and is localized to her bil low back and gluteals.  Pt with limited lumbar A/ROM and segmental mobility with tension in bil lumbar paraspinals and gluteals. Patient will benefit from skilled PT to address the below impairments and improve overall function.    OBJECTIVE IMPAIRMENTS: Abnormal gait, decreased activity tolerance, decreased balance, decreased endurance, decreased ROM, decreased strength, hypomobility, increased muscle spasms, impaired flexibility, improper body mechanics, and pain.   ACTIVITY LIMITATIONS: carrying, lifting, standing, squatting, transfers, and locomotion level  PARTICIPATION LIMITATIONS: cleaning, laundry, shopping, community activity, and occupation  PERSONAL FACTORS: 1 comorbidity: lumbar DDD are also affecting patient's functional outcome.   REHAB POTENTIAL: Good  CLINICAL DECISION MAKING: Stable/uncomplicated  EVALUATION COMPLEXITY: Low   GOALS: Goals reviewed with patient? Yes  SHORT TERM GOALS: Target date: 04/03/2024    Be independent in initial HEP Baseline: Goal status: MET  2. Report > or = to 30% reduction in LBP with work tasks   Goal status: IN PROGRESS  3.  Verbalize body mechanics modifications for safety with work tasks  Baseline:  Goal status: INITIAL     LONG TERM GOALS: Target date: 05/01/2024    Be independent in advanced HEP Baseline:  Goal status: INITIAL  2.  Improve Modified Oswestry to < or = to 10% disability (5/50) Baseline:  Goal status: INITIAL  3.  Report > or = to 60% reduction in LBP with work tasks  Baseline:  Goal status:  INITIAL   PLAN:  PT FREQUENCY: 1-2x/week  PT DURATION: 8 weeks  PLANNED INTERVENTIONS: 97110-Therapeutic exercises, 97530- Therapeutic activity, [id number]- Neuromuscular re-education, 97535- Self Care, 02859- Manual therapy, [id number]- Gait training, [phone]- Canalith repositioning, [id number]- Aquatic Therapy, [phone]- Electrical stimulation (unattended), [phone]- Electrical stimulation (manual), [id number]- Ultrasound, [id number]- Traction (mechanical), [id number] (1-2 muscles), [id number] (3+ muscles)- Dry Needling, Patient/Family education, Balance training, Taping, Joint mobilization, Joint manipulation, Spinal manipulation, Spinal mobilization, Vestibular training, Cryotherapy, and Moist heat.  PLAN FOR NEXT SESSION: body mechanics modifications for work tasks, TA activation, core and hip strength, review flexibility exercises   Mliss Cummins, PT  04/03/24 9:30 AM   [facility] [address], [address] [place], [place] [address] Phone # [phone] Fax [phone]

## 2024-04-10 ENCOUNTER — Ambulatory Visit: Admitting: Physical Therapy

## 2024-04-10 ENCOUNTER — Other Ambulatory Visit: Payer: Self-pay | Admitting: Surgical

## 2024-04-10 ENCOUNTER — Telehealth: Payer: Self-pay | Admitting: Orthopedic Surgery

## 2024-04-10 DIAGNOSIS — G5601 Carpal tunnel syndrome, right upper limb: Secondary | ICD-10-CM | POA: Diagnosis not present

## 2024-04-10 HISTORY — PX: CARPAL TUNNEL RELEASE: SHX101

## 2024-04-10 MED ORDER — TRAMADOL HCL 50 MG PO TABS: 50.0000 mg | ORAL_TABLET | Freq: Four times a day (QID) | ORAL | 0 refills | Status: AC | PRN

## 2024-04-10 NOTE — Telephone Encounter (Signed)
 Pt's son Dorn called requesting an out of work letter for pt due to surgery 8/4. Asking to pick letter up and be sent to mychart. Phone number when ready for pick up [phone].

## 2024-04-11 ENCOUNTER — Telehealth: Payer: Self-pay | Admitting: Orthopedic Surgery

## 2024-04-11 NOTE — Telephone Encounter (Signed)
 Note written and mychart message sent.

## 2024-04-11 NOTE — Telephone Encounter (Signed)
 Pt called about an update for out of work letter for pt and when letter can be picked up. Please call pt's son Dorn at [phone].

## 2024-04-11 NOTE — Telephone Encounter (Signed)
 Patient called and said she needs a letter for work stating she is having surgery and how long she will be out. CB#[phone]

## 2024-04-12 NOTE — Telephone Encounter (Signed)
 Note done. Patient and son came by office today

## 2024-04-17 ENCOUNTER — Ambulatory Visit: Attending: Family Medicine

## 2024-04-17 DIAGNOSIS — R252 Cramp and spasm: Secondary | ICD-10-CM | POA: Insufficient documentation

## 2024-04-17 DIAGNOSIS — M25661 Stiffness of right knee, not elsewhere classified: Secondary | ICD-10-CM | POA: Insufficient documentation

## 2024-04-17 DIAGNOSIS — M5459 Other low back pain: Secondary | ICD-10-CM | POA: Insufficient documentation

## 2024-04-17 DIAGNOSIS — M25561 Pain in right knee: Secondary | ICD-10-CM | POA: Diagnosis present

## 2024-04-17 DIAGNOSIS — M5441 Lumbago with sciatica, right side: Secondary | ICD-10-CM | POA: Diagnosis present

## 2024-04-17 DIAGNOSIS — R262 Difficulty in walking, not elsewhere classified: Secondary | ICD-10-CM | POA: Insufficient documentation

## 2024-04-17 DIAGNOSIS — G8929 Other chronic pain: Secondary | ICD-10-CM | POA: Diagnosis present

## 2024-04-17 DIAGNOSIS — R600 Localized edema: Secondary | ICD-10-CM | POA: Diagnosis present

## 2024-04-17 NOTE — Therapy (Signed)
 OUTPATIENT PHYSICAL THERAPY THORACOLUMBAR TREATMENT   Patient Name: [name] [name] MRN: [id number] DOB:11-04-1955, 68 y.o., female Today's Date: 04/17/2024  END OF SESSION:  PT End of Session - 04/17/24 0955     Visit Number 3    Date for PT Re-Evaluation 05/01/24    Authorization Type [employer]    PT Start Time 0800    PT Stop Time 0840    PT Time Calculation (min) 40 min    Activity Tolerance Patient tolerated treatment well    Behavior During Therapy WFL for tasks assessed/performed           Past Medical History:  Diagnosis Date   Arthritis    Complication of anesthesia    Diabetes mellitus without complication (HCC)    Gallstones    Hypertension    PONV (postoperative nausea and vomiting)    Ruptured ovarian cyst    Past Surgical History:  Procedure Laterality Date   CESAREAN SECTION     COLONOSCOPY  06/05/2019   per Dr. Eda, single adenomatous polyp, repeat in 7 yrs   KNEE ARTHROPLASTY Right 08/27/2021   Procedure: COMPUTER ASSISTED TOTAL KNEE ARTHROPLASTY;  Surgeon: Fidel Rogue, MD;  Location: [facility];  Service: Orthopedics;  Laterality: Right;   KNEE CLOSED REDUCTION Right 11/20/2021   Procedure: CLOSED MANIPULATION KNEE;  Surgeon: Fidel Rogue, MD;  Location: [facility];  Service: Orthopedics;  Laterality: Right;   lumpectomy,benign, right breast     x2   RIGHT SHOULDER SURGERY      TOTAL KNEE REVISION Right 02/02/2023   Procedure: REVISION RIGHT TOTAL KNEE ARTHROPLASTY;  Surgeon: Addie Cordella Hamilton, MD;  Location: [facility] OR;  Service: Orthopedics;  Laterality: Right;   Patient Active Problem List   Diagnosis Date Noted   Hepatic steatosis 06/01/2023   Elevated alkaline phosphatase level 06/01/2023   Pain due to total right knee replacement (HCC) 02/07/2023   S/P revision of total knee, right 02/02/2023   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   Primary hypertension 03/09/2022   Osteoarthritis of right knee 08/27/2021   S/P total knee  arthroplasty, 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   COVID-19 virus infection 04/09/2021   Dyslipidemia 03/31/2021   Urinary frequency 10/29/2017   Diabetes mellitus without complication (HCC) 09/14/2014   Special screening for malignant neoplasms, colon 11/25/2011   Diverticulosis of colon 11/25/2011   URTICARIA 01/31/2010   ECZEMA 05/03/2009    PCP: Johnny Senior, MD  REFERRING PROVIDER: Johnny Senior. MD  REFERRING DIAG:  Diagnosis  M54.41,G89.29 (ICD-10-CM) - Chronic right-sided low back pain with right-sided sciatica    Rationale for Evaluation and Treatment: Rehabilitation  THERAPY DIAG:  Other low back pain  Chronic right-sided low back pain with right-sided sciatica  Difficulty in walking, not elsewhere classified  Localized edema  Stiffness of right knee, not elsewhere classified  Cramp and spasm  Acute pain of right knee  ONSET DATE: chronic with flare up in June 2025  SUBJECTIVE:  SUBJECTIVE STATEMENT: Pt reports she had wrist surgery this past Monday due to Carpal tunnel. Doctor told her to rest the wrist, and she has a followup appointment this Friday. Pt reports no pain in lower back.    From MD note on 02/09/24:  Here for 5 days of sharp pains in the right lower back that sometimes radiate down the right leg. She has had these in the past, but they do not usually last this long. She has been seeing Dr. Glendia Hutchinson for knee pain, and she mentioned the back pain. He sent her for a lumbar spine MRI on 01-09-24. This showed only mild degenerative changes but nothing of a surgical nature. She says Tylenol  helps a little, but Meloxicam  helps more.     PERTINENT HISTORY:  Rt TKA 08/2021 with revision 2024, Rt total shoulder replacement 2020  PAIN: 04/17/24 Are you having pain? Yes: NPRS scale:  0/10 Pain location: bil low back into gluteals Pain description: constant, sharp aching Aggravating factors: early in the day at work, with movement  Relieving factors: wearing a brace, rest, oral meds  PRECAUTIONS: None  RED FLAGS: None   WEIGHT BEARING RESTRICTIONS: No  FALLS:  Has patient fallen in last 6 months? No  LIVING ENVIRONMENT: Lives with: lives with their family Lives in: House/apartment Stairs: No Has following equipment at home: None  OCCUPATION: works at [employer], 8 hour shifts.  Repetitive motion, lifting, standing  PLOF: Independent, Vocation/Vocational requirements: [employer]- see above for description, and Leisure: walk and bike for exercise   PATIENT GOALS: reduce pain, work with improved mechanics   NEXT MD VISIT: none   OBJECTIVE:  Note: Objective measures were completed at Evaluation unless otherwise noted.  DIAGNOSTIC FINDINGS:  MRI 01/09/23:  IMPRESSION: Mild degenerative changes of the lower lumbar spine. No significant spinal canal or neural foraminal stenosis at any level.  PATIENT SURVEYS:  03/06/24: Modified Oswestry:   Minimally Clinically Important Difference (MCID) = 12.8%  COGNITION: Overall cognitive status: Within functional limits for tasks assessed     SENSATION: WFL    POSTURE: forward head  PALPATION: Reduced lumbar segmental mobility, tension without pain over bil lumbar paraspinals and gluteals.   LUMBAR ROM:  Limited side bending by 25% bil with pain, all other ROM is full.   LOWER EXTREMITY ROM:    Hip flexibility is limited by 25% without pain.  Rt knee A/ROM limited due to TKA revision  LOWER EXTREMITY MMT:    Bil hip 4 to 4+/5, knee 4+/5   GAIT: WNLs   TREATMENT  DATE:   04/17/24 Nustep L5 x 5 min Standing lumbar ext x 10 and with arms on wall x 5 for options HS seated 2x30 sec B Piriformis seated (modified pigeon on R due to knee restrictions) 2x30 sec Side lying open books 2x8  Posterior Pelvic  tilt in supine 3x8 Bridge 2x 8 Seated marches 2x15 LAQ in seated 2x10 Sit to stand x10 Deadlift 10# KB 2x8 to 6 inch step Squats against stability ball on wall 2x8  Leg press 60# 3x8 seat 5 (try increasing weight next visit)   04/03/24/25:  Nustep L5 x 5 min Prone on elbows Prone press ups 2x10  Standing lumbar ext x 10 and with arms on wall x 5 for options HS seated 2x30 sec B Piriformis seated (modified pigeon on R due to knee restrictions) 2x30 sec LTR and SKTC  Hooklying PPT 5 sec hold x 5, then with march x 5 ea, then with  SLR x 5 ea Bridge x 10 Sit to stand x 10 Squat tap to mat x 10 Deadlift 10# KB x 10 to 6 inch step Leg press 60# 2x10 seat 6 (try seat 5 next time) Reviewed lifting - good return demo from patient    DATE: 03/06/24:  Findings from evaluation discussed, pt educated on plan of care, HEP initiated.                                                                                                                                  PATIENT EDUCATION:  Education details: Access Code: [email or web address] Person educated: Patient Education method: Explanation, Demonstration, and Handouts Education comprehension: verbalized understanding and returned demonstration  HOME EXERCISE PROGRAM: Access Code: [email or web address] URL: [email or web address] Date: 04/03/2024 Prepared by: Mliss  Exercises - Supine Lower Trunk Rotation  - 1 x daily - 7 x weekly - 1 sets - 3 reps - 20 hold - Hooklying Single Knee to Chest  - 3 x daily - 7 x weekly - 1 sets - 3 reps - 20 hold - Seated Hamstring Stretch  - 3 x daily - 7 x weekly - 1 sets - 3 reps - 20 hold - Seated Piriformis Stretch with Trunk Bend  - 3 x daily - 7 x weekly - 1 sets - 3 reps - 20 hold - Seated Transversus Abdominis Bracing  - 2 x daily - 7 x weekly - 1 sets - 10 reps - Seated Table Piriformis Stretch (Mirrored)  - 2 x daily - 7 x weekly - 1 sets - 3 reps - 30-60 sec hold - Prone Press Up  - 3-4 x daily - 7 x weekly  - 1-3 sets - 10 reps - Standing Lumbar Extension  - 1 x daily - 7 x weekly - 2 sets - 10 reps - Standing Lumbar Extension at Wall - Forearms  - 4-5 x daily - 7 x weekly - 2 sets - 10 reps  ASSESSMENT:  CLINICAL IMPRESSION:  Pt presents to skilled PT with no reported low back pain, and only slight residual pain from her recent wrist surgery. Doctor instructed pt not sure forearm and wrist until further notice (her follow up app is this Friday). Pt unable to perform prone press ups due to wrist condition, however did well with new exercises introduced that activated her quad and abdominal muscles. Pt demonstrated proper body mechanics and indicated no back pain during functional activities including squats against ball on wall and dead lifts with weight. Pt required several verbal cues during thoracic spinal twist. Patient will benefit from skilled PT to address the below impairments and improve overall function.   Eval: Patient is a 68 y.o. female who was seen today for physical therapy evaluation and treatment for LBP. She reports a chronic history with intermittent flare-ups.  She works at [employer] full time and is required to stand, lift, and [employer].  She reports  that pain has been 1-2/10 most recently and is localized to her bil low back and gluteals.  Pt with limited lumbar A/ROM and segmental mobility with tension in bil lumbar paraspinals and gluteals. Patient will benefit from skilled PT to address the below impairments and improve overall function.    OBJECTIVE IMPAIRMENTS: Abnormal gait, decreased activity tolerance, decreased balance, decreased endurance, decreased ROM, decreased strength, hypomobility, increased muscle spasms, impaired flexibility, improper body mechanics, and pain.   ACTIVITY LIMITATIONS: carrying, lifting, standing, squatting, transfers, and locomotion level  PARTICIPATION LIMITATIONS: cleaning, laundry, shopping, community activity, and occupation  PERSONAL  FACTORS: 1 comorbidity: lumbar DDD are also affecting patient's functional outcome.   REHAB POTENTIAL: Good  CLINICAL DECISION MAKING: Stable/uncomplicated  EVALUATION COMPLEXITY: Low   GOALS: Goals reviewed with patient? Yes  SHORT TERM GOALS: Target date: 04/03/2024    Be independent in initial HEP Baseline: Goal status: MET  2. Report > or = to 30% reduction in LBP with work tasks   Goal status: IN PROGRESS  3.  Verbalize body mechanics modifications for safety with work tasks  Baseline:  Goal status: INITIAL     LONG TERM GOALS: Target date: 05/01/2024    Be independent in advanced HEP Baseline:  Goal status: INITIAL  2.   Improve Modified Oswestry to < or = to 10% disability (5/50) Baseline:  Goal status: INITIAL  3.  Report > or = to 60% reduction in LBP with work tasks  Baseline:  Goal status: INITIAL   PLAN:  PT FREQUENCY: 1-2x/week  PT DURATION: 8 weeks  PLANNED INTERVENTIONS: 97110-Therapeutic exercises, 97530- Therapeutic activity, [id number]- Neuromuscular re-education, 97535- Self Care, [id number]- Manual therapy, [id number]- Gait training, [id number]- Canalith repositioning, [id number]- Aquatic Therapy, [phone]- Electrical stimulation (unattended), [phone]- Electrical stimulation (manual), [id number]- Ultrasound, [id number]- Traction (mechanical), [id number] (1-2 muscles), [id number] (3+ muscles)- Dry Needling, Patient/Family education, Balance training, Taping, Joint mobilization, Joint manipulation, Spinal manipulation, Spinal mobilization, Vestibular training, Cryotherapy, and Moist heat.  PLAN FOR NEXT SESSION: body mechanics modifications for work tasks, TA activation, core and hip strength, review flexibility exercise, continue dead lifts and squats, thoracic spinal mobility. See when change in wrist restrictions.    Lavanda Cleverly, SPT 04/17/24 10:11 AM I agree with the following treatment note after reviewing documentation. This session was performed under the supervision of a licensed  clinician. Burnard Joy, PT 04/17/24 10:11 AM   [facility] [address], [address] [place], [place] [address] Phone # [phone] Fax [phone]

## 2024-04-21 ENCOUNTER — Ambulatory Visit (INDEPENDENT_AMBULATORY_CARE_PROVIDER_SITE_OTHER): Admitting: Orthopedic Surgery

## 2024-04-21 ENCOUNTER — Encounter: Payer: Self-pay | Admitting: Orthopedic Surgery

## 2024-04-21 DIAGNOSIS — M25532 Pain in left wrist: Secondary | ICD-10-CM

## 2024-04-21 DIAGNOSIS — R202 Paresthesia of skin: Secondary | ICD-10-CM

## 2024-04-21 DIAGNOSIS — M25531 Pain in right wrist: Secondary | ICD-10-CM

## 2024-04-21 NOTE — Progress Notes (Signed)
 Post-Op Visit Note   Patient: [name] [name]           Date of Birth: 08/13/1956           MRN: [id number] Visit Date: 04/21/2024 PCP: Johnny Garnette LABOR, MD   Assessment & Plan:  Chief Complaint:  Chief Complaint  Patient presents with   Right Hand - Routine Post Op    04/10/24 right CTR   Visit Diagnoses:  1. Paresthesia of skin   2. Bilateral wrist pain     Plan: Patient is now about 10 days out from right carpal tunnel release.  Overall she is doing well.  On exam she has healed incision.  Sutures are removed.  Abductor pollicis brevis strength is intact.  She actually states that her left wrist is worse than the right wrist.  We are going to keep her out of work for 5 more weeks because it does involve a lot of heavy lifting.  Come back in 4 weeks for clinical recheck on the right wrist and decision for her against surgical scheduling for left carpal tunnel release.  Follow-Up Instructions: No follow-ups on file.   Orders:  No orders of the defined types were placed in this encounter.  No orders of the defined types were placed in this encounter.   Imaging: No results found.  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   COVID-19 virus infection 04/09/2021   Dyslipidemia 03/31/2021   Urinary frequency 10/29/2017   Diabetes mellitus without complication (HCC) 09/14/2014   Special screening for malignant neoplasms, colon 11/25/2011   Diverticulosis of colon 11/25/2011   URTICARIA 01/31/2010   ECZEMA 05/03/2009   Past Medical History:  Diagnosis Date   Arthritis    Complication of  anesthesia    Diabetes mellitus without complication (HCC)    Gallstones    Hypertension    PONV (postoperative nausea and vomiting)    Ruptured ovarian cyst     Family History  Problem Relation Age of Onset   Cancer Other        breast cancer   Colon cancer Neg Hx    Esophageal cancer Neg Hx    Stomach cancer Neg Hx    Rectal cancer Neg Hx    Colon polyps Neg Hx     Past Surgical History:  Procedure Laterality Date   CESAREAN SECTION     COLONOSCOPY  06/05/2019   per Dr. Eda, single adenomatous polyp, repeat in 7 yrs   KNEE ARTHROPLASTY Right 08/27/2021   Procedure: COMPUTER ASSISTED TOTAL KNEE ARTHROPLASTY;  Surgeon: Fidel Rogue, MD;  Location: [facility];  Service: Orthopedics;  Laterality: Right;   KNEE CLOSED REDUCTION Right 11/20/2021   Procedure: CLOSED MANIPULATION KNEE;  Surgeon: Fidel Rogue, MD;  Location: [facility];  Service: Orthopedics;  Laterality: Right;   lumpectomy,benign, right breast     x2   RIGHT SHOULDER SURGERY      TOTAL KNEE REVISION Right 02/02/2023   Procedure: REVISION RIGHT TOTAL KNEE ARTHROPLASTY;  Surgeon: Addie Cordella Hamilton, MD;  Location: [facility] OR;  Service: Orthopedics;  Laterality: Right;   Social History  Occupational History   Occupation: food service  Tobacco Use   Smoking status: Never    Passive exposure: Never   Smokeless tobacco: Never  Vaping Use   Vaping status: Never Used  Substance and Sexual Activity   Alcohol  use: Yes    Alcohol /week: 0.0 standard drinks of alcohol     Comment: maybe twice a month   Drug use: No   Sexual activity: Not on file

## 2024-04-24 ENCOUNTER — Ambulatory Visit

## 2024-04-24 DIAGNOSIS — R262 Difficulty in walking, not elsewhere classified: Secondary | ICD-10-CM

## 2024-04-24 DIAGNOSIS — M5459 Other low back pain: Secondary | ICD-10-CM | POA: Diagnosis not present

## 2024-04-24 DIAGNOSIS — G8929 Other chronic pain: Secondary | ICD-10-CM

## 2024-04-24 NOTE — Therapy (Signed)
 OUTPATIENT PHYSICAL THERAPY THORACOLUMBAR TREATMENT   Patient Name: [name] [name] MRN: [id number] DOB:May 20, 1956, 68 y.o., female Today's Date: 04/24/2024  END OF SESSION:  PT End of Session - 04/24/24 0839     Visit Number 4    Date for PT Re-Evaluation 05/01/24    Authorization Type Aet[employer]    PT Start Time 0800    PT Stop Time 0839    PT Time Calculation (min) 39 min    Activity Tolerance Patient tolerated treatment well    Behavior During Therapy WFL for tasks assessed/performed            Past Medical History:  Diagnosis Date   Arthritis    Complication of anesthesia    Diabetes mellitus without complication (HCC)    Gallstones    Hypertension    PONV (postoperative nausea and vomiting)    Ruptured ovarian cyst    Past Surgical History:  Procedure Laterality Date   CESAREAN SECTION     COLONOSCOPY  06/05/2019   per Dr. Eda, single adenomatous polyp, repeat in 7 yrs   KNEE ARTHROPLASTY Right 08/27/2021   Procedure: COMPUTER ASSISTED TOTAL KNEE ARTHROPLASTY;  Surgeon: Fidel Rogue, MD;  Location: [facility];  Service: Orthopedics;  Laterality: Right;   KNEE CLOSED REDUCTION Right 11/20/2021   Procedure: CLOSED MANIPULATION KNEE;  Surgeon: Fidel Rogue, MD;  Location: [facility];  Service: Orthopedics;  Laterality: Right;   lumpectomy,benign, right breast     x2   RIGHT SHOULDER SURGERY      TOTAL KNEE REVISION Right 02/02/2023   Procedure: REVISION RIGHT TOTAL KNEE ARTHROPLASTY;  Surgeon: Addie Cordella Hamilton, MD;  Location: [facility] OR;  Service: Orthopedics;  Laterality: Right;   Patient Active Problem List   Diagnosis Date Noted   Hepatic steatosis 06/01/2023   Elevated alkaline phosphatase level 06/01/2023   Pain due to total right knee replacement (HCC) 02/07/2023   S/P revision of total knee, right 02/02/2023   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   Primary hypertension 03/09/2022   Osteoarthritis of right knee 08/27/2021   S/P total knee  arthroplasty, 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   COVID-19 virus infection 04/09/2021   Dyslipidemia 03/31/2021   Urinary frequency 10/29/2017   Diabetes mellitus without complication (HCC) 09/14/2014   Special screening for malignant neoplasms, colon 11/25/2011   Diverticulosis of colon 11/25/2011   URTICARIA 01/31/2010   ECZEMA 05/03/2009    PCP: Johnny Senior, MD  REFERRING PROVIDER: Johnny Senior. MD  REFERRING DIAG:  Diagnosis  M54.41,G89.29 (ICD-10-CM) - Chronic right-sided low back pain with right-sided sciatica    Rationale for Evaluation and Treatment: Rehabilitation  THERAPY DIAG:  Other low back pain  Chronic right-sided low back pain with right-sided sciatica  Difficulty in walking, not elsewhere classified  ONSET DATE: chronic with flare up in June 2025  SUBJECTIVE:  SUBJECTIVE STATEMENT: Low back feels 50% better.  I've been out of work so that is helping.     From MD note on 02/09/24:  Here for 5 days of sharp pains in the right lower back that sometimes radiate down the right leg. She has had these in the past, but they do not usually last this long. She has been seeing Dr. Glendia Hutchinson for knee pain, and she mentioned the back pain. He sent her for a lumbar spine MRI on 01-09-24. This showed only mild degenerative changes but nothing of a surgical nature. She says Tylenol  helps a little, but Meloxicam  helps more.    PERTINENT HISTORY:  Rt TKA 08/2021 with revision 2024, Rt total shoulder replacement 2020  PAIN: 04/24/24 Are you having pain? Yes: NPRS scale: 0/10 Pain location: bil low back into gluteals Pain description: constant, sharp aching Aggravating factors: early in the day at work, with movement  Relieving factors: wearing a brace, rest, oral meds  PRECAUTIONS: None  RED  FLAGS: None   WEIGHT BEARING RESTRICTIONS: No  FALLS:  Has patient fallen in last 6 months? No  LIVING ENVIRONMENT: Lives with: lives with their family Lives in: House/apartment Stairs: No Has following equipment at home: None  OCCUPATION: works at [employer], 8 hour shifts.  Repetitive motion, lifting, standing  PLOF: Independent, Vocation/Vocational requirements: [employer]- see above for description, and Leisure: walk and bike for exercise   PATIENT GOALS: reduce pain, work with improved mechanics   NEXT MD VISIT: none   OBJECTIVE:  Note: Objective measures were completed at Evaluation unless otherwise noted.  DIAGNOSTIC FINDINGS:  MRI 01/09/23:  IMPRESSION: Mild degenerative changes of the lower lumbar spine. No significant spinal canal or neural foraminal stenosis at any level.  PATIENT SURVEYS:  03/06/24: Modified Oswestry:   Minimally Clinically Important Difference (MCID) = 12.8%  COGNITION: Overall cognitive status: Within functional limits for tasks assessed     SENSATION: WFL    POSTURE: forward head  PALPATION: Reduced lumbar segmental mobility, tension without pain over bil lumbar paraspinals and gluteals.   LUMBAR ROM:  Limited side bending by 25% bil with pain, all other ROM is full.   LOWER EXTREMITY ROM:    Hip flexibility is limited by 25% without pain.  Rt knee A/ROM limited due to TKA revision  LOWER EXTREMITY MMT:    Bil hip 4 to 4+/5, knee 4+/5   GAIT: WNLs   TREATMENT  DATE:   04/24/24 Nustep L5 x 6 min-PT present to discuss progress with pt Standing lumbar ext x 10 and with arms on wall x 5 for options Seated on air pillow: holding blue weighted ball: diagonals 2x10 (hip to shoulder) HS seated 3x30 sec B Sidelying clam: 2x10  Side lying open books 2x8  Posterior Pelvic tilt in supine 3x8 Bridge 2x 10 with 5 hold LAQ in seated 2x10 Sit to stand 3x10 holding 5# kettlebell Holding 5# Farmer's carry with march 2x10 bil  each Deadlift 10# KB 2x8 to 6 inch step Squats against stability ball on wall 2x8  Leg press 75# 3x10 seat 5, 45# single leg 2x10   04/17/24 Nustep L5 x 5 min-SPT and PT present to discuss progress  Standing lumbar ext x 10 and with arms on wall x 5 for options HS seated 2x30 sec B Piriformis seated (modified pigeon on R due to knee restrictions) 2x30 sec Side lying open books 2x8  Posterior Pelvic tilt in supine 3x8 Bridge 2x 8 Seated marches 2x15 LAQ  in seated 2x10 Sit to stand x10 Deadlift 10# KB 2x8 to 6 inch step Squats against stability ball on wall 2x8  Leg press 60# 3x8 seat 5 (try increasing weight next visit)   04/03/24/25:  Nustep L5 x 5 min Prone on elbows Prone press ups 2x10  Standing lumbar ext x 10 and with arms on wall x 5 for options HS seated 2x30 sec B Piriformis seated (modified pigeon on R due to knee restrictions) 2x30 sec LTR and SKTC  Hooklying PPT 5 sec hold x 5, then with march x 5 ea, then with SLR x 5 ea Bridge x 10 Sit to stand x 10 Squat tap to mat x 10 Deadlift 10# KB x 10 to 6 inch step Leg press 60# 2x10 seat 6 (try seat 5 next time) Reviewed lifting - good return demo from patient    PATIENT EDUCATION:  Education details: Access Code: [email or web address] Person educated: Patient Education method: Explanation, Demonstration, and Handouts Education comprehension: verbalized understanding and returned demonstration  HOME EXERCISE PROGRAM: Access Code: [email or web address] URL: [email or web address] Date: 04/03/2024 Prepared by: Mliss  Exercises - Supine Lower Trunk Rotation  - 1 x daily - 7 x weekly - 1 sets - 3 reps - 20 hold - Hooklying Single Knee to Chest  - 3 x daily - 7 x weekly - 1 sets - 3 reps - 20 hold - Seated Hamstring Stretch  - 3 x daily - 7 x weekly - 1 sets - 3 reps - 20 hold - Seated Piriformis Stretch with Trunk Bend  - 3 x daily - 7 x weekly - 1 sets - 3 reps - 20 hold - Seated Transversus Abdominis Bracing  - 2 x daily  - 7 x weekly - 1 sets - 10 reps - Seated Table Piriformis Stretch (Mirrored)  - 2 x daily - 7 x weekly - 1 sets - 3 reps - 30-60 sec hold - Prone Press Up  - 3-4 x daily - 7 x weekly - 1-3 sets - 10 reps - Standing Lumbar Extension  - 1 x daily - 7 x weekly - 2 sets - 10 reps - Standing Lumbar Extension at Wall - Forearms  - 4-5 x daily - 7 x weekly - 2 sets - 10 reps  ASSESSMENT:  CLINICAL IMPRESSION: Pt is now out of her Rt wrist brace and is limited to lifting 5# now.  She will be out of work for 5 weeks and might consider having the Lt wrist carpal tunnel released. She reports 50% overall reduction in LBP since the start of care.  She did well with advancement of exercises today and demonstrated good form.  PT was present throughout session for verbal cues and to monitor for pain.  Patient will benefit from skilled PT to address the below impairments and improve overall function.   Eval: Patient is a 68 y.o. female who was seen today for physical therapy evaluation and treatment for LBP. She reports a chronic history with intermittent flare-ups.  She works at [employer] full time and is required to stand, lift, and [employer].  She reports that pain has been 1-2/10 most recently and is localized to her bil low back and gluteals.  Pt with limited lumbar A/ROM and segmental mobility with tension in bil lumbar paraspinals and gluteals. Patient will benefit from skilled PT to address the below impairments and improve overall function.    OBJECTIVE IMPAIRMENTS: Abnormal gait, decreased activity tolerance, decreased balance, decreased endurance, decreased ROM,  decreased strength, hypomobility, increased muscle spasms, impaired flexibility, improper body mechanics, and pain.   ACTIVITY LIMITATIONS: carrying, lifting, standing, squatting, transfers, and locomotion level  PARTICIPATION LIMITATIONS: cleaning, laundry, shopping, community activity, and occupation  PERSONAL FACTORS: 1 comorbidity: lumbar DDD  are also affecting patient's functional outcome.   REHAB POTENTIAL: Good  CLINICAL DECISION MAKING: Stable/uncomplicated  EVALUATION COMPLEXITY: Low   GOALS: Goals reviewed with patient? Yes  SHORT TERM GOALS: Target date: 04/03/2024    Be independent in initial HEP Baseline: Goal status: MET  2. Report > or = to 30% reduction in LBP with work tasks  50% for home tasks  Goal status: IN PROGRESS  3.  Verbalize body mechanics modifications for safety with work tasks  Baseline:  Goal status: MET     LONG TERM GOALS: Target date: 05/01/2024    Be independent in advanced HEP Baseline:  Goal status: In progress   2.   Improve Modified Oswestry to < or = to 10% disability (5/50) Baseline:  Goal status: INITIAL  3.  Report > or = to 60% reduction in LBP with work tasks  Baseline:  Goal status: INITIAL   PLAN:  PT FREQUENCY: 1-2x/week  PT DURATION: 8 weeks  PLANNED INTERVENTIONS: 97110-Therapeutic exercises, 97530- Therapeutic activity, [id number]- Neuromuscular re-education, 97535- Self Care, 02859- Manual therapy, [id number]- Gait training, [phone]- Canalith repositioning, [id number]- Aquatic Therapy, [phone]- Electrical stimulation (unattended), [phone]- Electrical stimulation (manual), [id number]- Ultrasound, [id number]- Traction (mechanical), [id number] (1-2 muscles), [id number] (3+ muscles)- Dry Needling, Patient/Family education, Balance training, Taping, Joint mobilization, Joint manipulation, Spinal manipulation, Spinal mobilization, Vestibular training, Cryotherapy, and Moist heat.  PLAN FOR NEXT SESSION: body mechanics modifications for work tasks, TA activation, core and hip strength, review flexibility exercise, continue dead lifts and squats, thoracic spinal mobility.5# restriction on lifting with Rt UE.  ERO next session. Pt would like to continue 2 more weeks after next week.  Advance HEP-include sit to stand and clams   Burnard Joy, PT 04/24/24 8:40 AM   [facility]  Services [address] [place], [place] [address] Phone # [phone] Fax [phone]

## 2024-05-01 ENCOUNTER — Ambulatory Visit: Admitting: Physical Therapy

## 2024-05-09 ENCOUNTER — Ambulatory Visit: Admitting: Physical Therapy

## 2024-05-10 ENCOUNTER — Encounter: Payer: Self-pay | Admitting: Physical Therapy

## 2024-05-10 ENCOUNTER — Ambulatory Visit: Attending: Family Medicine | Admitting: Physical Therapy

## 2024-05-10 DIAGNOSIS — M5441 Lumbago with sciatica, right side: Secondary | ICD-10-CM | POA: Insufficient documentation

## 2024-05-10 DIAGNOSIS — R262 Difficulty in walking, not elsewhere classified: Secondary | ICD-10-CM | POA: Diagnosis present

## 2024-05-10 DIAGNOSIS — G8929 Other chronic pain: Secondary | ICD-10-CM | POA: Insufficient documentation

## 2024-05-10 DIAGNOSIS — M5459 Other low back pain: Secondary | ICD-10-CM | POA: Insufficient documentation

## 2024-05-10 NOTE — Therapy (Signed)
 OUTPATIENT PHYSICAL THERAPY THORACOLUMBAR TREATMENT AND DISCHARGE SUMMARY (RE-CERT FOR FINAL VISIT)   Patient Name: [name] [name] MRN: [id number] DOB:1956-05-16, 68 y.o., female Today's Date: 05/10/2024  END OF SESSION:  PT End of Session - 05/10/24 1041     Visit Number 5    Date for PT Re-Evaluation 05/10/24    Authorization Type Aetna    PT Start Time 1017    PT Stop Time 1056    PT Time Calculation (min) 39 min    Activity Tolerance Patient tolerated treatment well    Behavior During Therapy WFL for tasks assessed/performed             Past Medical History:  Diagnosis Date   Arthritis    Complication of anesthesia    Diabetes mellitus without complication (HCC)    Gallstones    Hypertension    PONV (postoperative nausea and vomiting)    Ruptured ovarian cyst    Past Surgical History:  Procedure Laterality Date   CESAREAN SECTION     COLONOSCOPY  06/05/2019   per Dr. Eda, single adenomatous polyp, repeat in 7 yrs   KNEE ARTHROPLASTY Right 08/27/2021   Procedure: COMPUTER ASSISTED TOTAL KNEE ARTHROPLASTY;  Surgeon: Fidel Rogue, MD;  Location: [facility];  Service: Orthopedics;  Laterality: Right;   KNEE CLOSED REDUCTION Right 11/20/2021   Procedure: CLOSED MANIPULATION KNEE;  Surgeon: Fidel Rogue, MD;  Location: [facility];  Service: Orthopedics;  Laterality: Right;   lumpectomy,benign, right breast     x2   RIGHT SHOULDER SURGERY      TOTAL KNEE REVISION Right 02/02/2023   Procedure: REVISION RIGHT TOTAL KNEE ARTHROPLASTY;  Surgeon: Addie Cordella Hamilton, MD;  Location: [facility] OR;  Service: Orthopedics;  Laterality: Right;   Patient Active Problem List   Diagnosis Date Noted   Hepatic steatosis 06/01/2023   Elevated alkaline phosphatase level 06/01/2023   Pain due to total right knee replacement (HCC) 02/07/2023   S/P revision of total knee, right 02/02/2023   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   Primary hypertension 03/09/2022    Osteoarthritis of right knee 08/27/2021   S/P total knee arthroplasty, 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   COVID-19 virus infection 04/09/2021   Dyslipidemia 03/31/2021   Urinary frequency 10/29/2017   Diabetes mellitus without complication (HCC) 09/14/2014   Special screening for malignant neoplasms, colon 11/25/2011   Diverticulosis of colon 11/25/2011   URTICARIA 01/31/2010   ECZEMA 05/03/2009    PCP: Johnny Senior, MD  REFERRING PROVIDER: Johnny Senior. MD  REFERRING DIAG:  Diagnosis  M54.41,G89.29 (ICD-10-CM) - Chronic right-sided low back pain with right-sided sciatica    Rationale for Evaluation and Treatment: Rehabilitation  THERAPY DIAG:  Other low back pain  Chronic right-sided low back pain with right-sided sciatica  Difficulty in walking, not elsewhere classified  ONSET DATE: chronic with flare up in June 2025  SUBJECTIVE:  SUBJECTIVE STATEMENT: My back is better. The pain is regular for my age I think.    From MD note on 02/09/24:  Here for 5 days of sharp pains in the right lower back that sometimes radiate down the right leg. She has had these in the past, but they do not usually last this long. She has been seeing Dr. Glendia Hutchinson for knee pain, and she mentioned the back pain. He sent her for a lumbar spine MRI on 01-09-24. This showed only mild degenerative changes but nothing of a surgical nature. She says Tylenol  helps a little, but Meloxicam  helps more.    PERTINENT HISTORY:  Rt TKA 08/2021 with revision 2024, Rt total shoulder replacement 2020  PAIN: 04/24/24 Are you having pain? Yes: NPRS scale: 0/10 Pain location: bil low back into gluteals Pain description: constant, sharp aching Aggravating factors: early in the day at work, with movement  Relieving factors: wearing a brace, rest, oral  meds  PRECAUTIONS: None  RED FLAGS: None   WEIGHT BEARING RESTRICTIONS: No  FALLS:  Has patient fallen in last 6 months? No  LIVING ENVIRONMENT: Lives with: lives with their family Lives in: House/apartment Stairs: No Has following equipment at home: None  OCCUPATION: works at [employer], 8 hour shifts.  Repetitive motion, lifting, standing  PLOF: Independent, Vocation/Vocational requirements: [employer]- see above for description, and Leisure: walk and bike for exercise   PATIENT GOALS: reduce pain, work with improved mechanics   NEXT MD VISIT: none   OBJECTIVE:  Note: Objective measures were completed at Evaluation unless otherwise noted.  DIAGNOSTIC FINDINGS:  MRI 01/09/23:  IMPRESSION: Mild degenerative changes of the lower lumbar spine. No significant spinal canal or neural foraminal stenosis at any level.  PATIENT SURVEYS:  03/06/24: Modified Oswestry:  05/10/24: 1 / 50 = 2.0 % Minimally Clinically Important Difference (MCID) = 12.8%   COGNITION: Overall cognitive status: Within functional limits for tasks assessed     SENSATION: WFL    POSTURE: forward head  PALPATION: Reduced lumbar segmental mobility, tension without pain over bil lumbar paraspinals and gluteals.   LUMBAR ROM:  Limited side bending by 25% bil with pain, all other ROM is full.   LOWER EXTREMITY ROM:    Hip flexibility is limited by 25% without pain.  Rt knee A/ROM limited due to TKA revision  LOWER EXTREMITY MMT:    Bil hip 4 to 4+/5, knee 4+/5   GAIT: WNLs   TREATMENT  DATE:   05/10/24 Seated on air pillow: holding yellow weighted ball: diagonals 2x10 (hip to shoulder) Sidelying clam: 2x10 then with green band x 20 Sit to stand with green band 2x10 Bridge with clam green 2x 10 Sit to stand 2x10 holding  Holding 5# Farmer's carry with march 2x10 bil each Deadlift 10# KB 2x10 to 6 inch step Squats against wall x 10 Functional squat x 10 to mat table Leg press 75# 3x10 seat 5,  45# single leg 2x10   04/24/24 Nustep L5 x 6 min-PT present to discuss progress with pt Standing lumbar ext x 10 and with arms on wall x 5 for options Seated on air pillow: holding blue weighted ball: diagonals 2x10 (hip to shoulder) HS seated 3x30 sec B Sidelying clam: 2x10  Side lying open books 2x8  Posterior Pelvic tilt in supine 3x8 Bridge 2x 10 with 5 hold LAQ in seated 2x10 Sit to stand 3x10 holding 5# kettlebell Holding 5# Farmer's carry with march 2x10 bil each Deadlift 10# KB 2x8  to 6 inch step Squats against stability ball on wall 2x8  Leg press 75# 3x10 seat 5, 45# single leg 2x10  04/17/24 Nustep L5 x 5 min-SPT and PT present to discuss progress  Standing lumbar ext x 10 and with arms on wall x 5 for options HS seated 2x30 sec B Piriformis seated (modified pigeon on R due to knee restrictions) 2x30 sec Side lying open books 2x8  Posterior Pelvic tilt in supine 3x8 Bridge 2x 8 Seated marches 2x15 LAQ in seated 2x10 Sit to stand x10 Deadlift 10# KB 2x8 to 6 inch step Squats against stability ball on wall 2x8  Leg press 60# 3x8 seat 5 (try increasing weight next visit)   04/03/24/25:  Nustep L5 x 5 min Prone on elbows Prone press ups 2x10  Standing lumbar ext x 10 and with arms on wall x 5 for options HS seated 2x30 sec B Piriformis seated (modified pigeon on R due to knee restrictions) 2x30 sec LTR and SKTC  Hooklying PPT 5 sec hold x 5, then with march x 5 ea, then with SLR x 5 ea Bridge x 10 Sit to stand x 10 Squat tap to mat x 10 Deadlift 10# KB x 10 to 6 inch step Leg press 60# 2x10 seat 6 (try seat 5 next time) Reviewed lifting - good return demo from patient    PATIENT EDUCATION:  Education details: Access Code: [email or web address] Person educated: Patient Education method: Explanation, Demonstration, and Handouts Education comprehension: verbalized understanding and returned demonstration  HOME EXERCISE PROGRAM: Access Code: [email or web address] URL:  [email or web address] Date: 04/03/2024 Prepared by: Mliss  Exercises - Supine Lower Trunk Rotation  - 1 x daily - 7 x weekly - 1 sets - 3 reps - 20 hold - Hooklying Single Knee to Chest  - 3 x daily - 7 x weekly - 1 sets - 3 reps - 20 hold - Seated Hamstring Stretch  - 3 x daily - 7 x weekly - 1 sets - 3 reps - 20 hold - Seated Piriformis Stretch with Trunk Bend  - 3 x daily - 7 x weekly - 1 sets - 3 reps - 20 hold - Seated Transversus Abdominis Bracing  - 2 x daily - 7 x weekly - 1 sets - 10 reps - Seated Table Piriformis Stretch (Mirrored)  - 2 x daily - 7 x weekly - 1 sets - 3 reps - 30-60 sec hold - Prone Press Up  - 3-4 x daily - 7 x weekly - 1-3 sets - 10 reps - Standing Lumbar Extension  - 1 x daily - 7 x weekly - 2 sets - 10 reps - Standing Lumbar Extension at Wall - Forearms  - 4-5 x daily - 7 x weekly - 2 sets - 10 reps  ASSESSMENT:  CLINICAL IMPRESSION: Patient presents today reporting she is ready to be done with PT. Goals were assessed and MET. She denies pain today and reports that her pain levels seem appropriate for her age. Her ODI score of 1% shows significant functional improvement. HEP was updated today with functional strengthening. Body mechanics were verbally reviewed for patient's return to work. Patient is pleased with her current level of function and agrees to discharge.   Eval: Patient is a 68 y.o. female who was seen today for physical therapy evaluation and treatment for LBP. She reports a chronic history with intermittent flare-ups.  She works at [employer] full time and is required to stand, lift, and [employer].  She  reports that pain has been 1-2/10 most recently and is localized to her bil low back and gluteals.  Pt with limited lumbar A/ROM and segmental mobility with tension in bil lumbar paraspinals and gluteals. Patient will benefit from skilled PT to address the below impairments and improve overall function.    OBJECTIVE IMPAIRMENTS: Abnormal  gait, decreased activity tolerance, decreased balance, decreased endurance, decreased ROM, decreased strength, hypomobility, increased muscle spasms, impaired flexibility, improper body mechanics, and pain.   ACTIVITY LIMITATIONS: carrying, lifting, standing, squatting, transfers, and locomotion level  PARTICIPATION LIMITATIONS: cleaning, laundry, shopping, community activity, and occupation  PERSONAL FACTORS: 1 comorbidity: lumbar DDD are also affecting patient's functional outcome.   REHAB POTENTIAL: Good  CLINICAL DECISION MAKING: Stable/uncomplicated  EVALUATION COMPLEXITY: Low   GOALS: Goals reviewed with patient? Yes  SHORT TERM GOALS: Target date: 04/03/2024    Be independent in initial HEP Baseline: Goal status: MET  2. Report > or = to 30% reduction in LBP with work tasks  50% for home tasks  Goal status: IN PROGRESS  3.  Verbalize body mechanics modifications for safety with work tasks  Baseline:  Goal status: MET     LONG TERM GOALS: Target date: 05/01/2024    Be independent in advanced HEP Baseline:  Goal status: MET 05/10/24   2.   Improve Modified Oswestry to < or = to 10% disability (5/50) Baseline:  Goal status: MET 1 / 50 = 2.0 % 9/3/2  3.  Report > or = to 60% reduction in LBP with work tasks  Baseline:  Goal status: MET 80%    PLAN:  PT FREQUENCY: 1-2x/week  PT DURATION: 8 weeks  PLANNED INTERVENTIONS: 97110-Therapeutic exercises, 97530- Therapeutic activity, [id number]- Neuromuscular re-education, 97535- Self Care, 02859- Manual therapy, [id number]- Gait training, [id number]- Canalith repositioning, [id number]- Aquatic Therapy, [phone]- Electrical stimulation (unattended), [phone]- Electrical stimulation (manual), [id number]- Ultrasound, [id number]- Traction (mechanical), [id number] (1-2 muscles), 20561 (3+ muscles)- Dry Needling, Patient/Family education, Balance training, Taping, Joint mobilization, Joint manipulation, Spinal manipulation, Spinal mobilization, Vestibular  training, Cryotherapy, and Moist heat.  PLAN FOR NEXT SESSION: see d/c summary  Mliss Cummins, PT  05/10/24 1:37 PM   [facility] [address], [address] [place], [place] [address] Phone # [phone] Fax [phone]   PHYSICAL THERAPY DISCHARGE SUMMARY  Visits from Start of Care: 5  Current functional level related to goals / functional outcomes: See above clinical impression.    Remaining deficits: See above clinical impression.    Education / Equipment: HEP   Patient agrees to discharge. Patient goals were met. Patient is being discharged due to meeting the stated rehab goals.   Mliss Cummins, PT 05/10/24 1:48 PM [facility] [address] [place], [place] [address] Phone # [phone] Fax [phone]

## 2024-05-15 ENCOUNTER — Ambulatory Visit

## 2024-05-19 ENCOUNTER — Encounter: Payer: Self-pay | Admitting: Surgical

## 2024-05-19 ENCOUNTER — Ambulatory Visit (INDEPENDENT_AMBULATORY_CARE_PROVIDER_SITE_OTHER): Admitting: Surgical

## 2024-05-19 DIAGNOSIS — G5603 Carpal tunnel syndrome, bilateral upper limbs: Secondary | ICD-10-CM

## 2024-05-19 NOTE — Progress Notes (Signed)
 Post-Op Visit Note   Patient: [name] [name]           Date of Birth: [birth year]           MRN: [id number] Visit Date: 05/19/2024 PCP: Johnny Garnette LABOR, MD   Assessment & Plan:  Chief Complaint:  Chief Complaint  Patient presents with   Right Hand - Routine Post Op   Visit Diagnoses:  1. Bilateral carpal tunnel syndrome     Plan: Patient is a 68 year old female who presents s/p right carpal tunnel release on 04/11/2024.  Overall she is doing well.  Numbness and tingling in her hand is about 70% better.  Still having some tenderness and pillar pain when she tries to do activities such as cutting things with a knife or pushing off from a chair.  Overall she is very pleased with how she is doing and she is still trending better.  On exam, incision is healing well without evidence of infection or dehiscence.  Intact EPL, FPL, finger abduction, thumb abduction, grip strength rated 5/5.  2+ radial pulse of the operative extremity.  Overall at this time, she is okay for full activity with her right hand but avoid lifting more than 10 to 15 pounds.  She wants to have surgery on her left side and has history of left carpal tunnel syndrome from her prior nerve study.  Plan to continue out of work until after left carpal tunnel release.  Will do this in about 2 weeks to give her a little bit of extra time for the right hand to heal and get less painful in regards to her pillar pain.  Follow-up after left carpal tunnel release.  She understands the risks and benefits of the procedure as well as the recovery timeframe.  Follow-Up Instructions: Return for After procedure.   Orders:  No orders of the defined types were placed in this encounter.  No orders of the defined types were placed in this encounter.   Imaging: No results found.  PMFS History: Patient Active Problem List   Diagnosis Date Noted   Hepatic steatosis 06/01/2023   Elevated alkaline phosphatase level 06/01/2023   Pain due to  total right knee replacement (HCC) 02/07/2023   S/P revision of total knee, right 02/02/2023   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   Primary hypertension 03/09/2022   Osteoarthritis of right knee 08/27/2021   S/P total knee arthroplasty, 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   COVID-19 virus infection 04/09/2021   Dyslipidemia 03/31/2021   Urinary frequency 10/29/2017   Diabetes mellitus without complication (HCC) 09/14/2014   Special screening for malignant neoplasms, colon 11/25/2011   Diverticulosis of colon 11/25/2011   URTICARIA 01/31/2010   ECZEMA 05/03/2009   Past Medical History:  Diagnosis Date   Arthritis    Complication of anesthesia    Diabetes mellitus without complication (HCC)    Gallstones    Hypertension    PONV (postoperative nausea and vomiting)    Ruptured ovarian cyst     Family History  Problem Relation Age of Onset   Cancer Other        breast cancer   Colon cancer Neg Hx    Esophageal cancer Neg Hx    Stomach cancer Neg Hx    Rectal cancer Neg Hx    Colon polyps Neg Hx     Past Surgical History:  Procedure Laterality Date   CESAREAN SECTION     COLONOSCOPY  06/05/2019   per Dr. Eda, single adenomatous  polyp, repeat in 7 yrs   KNEE ARTHROPLASTY Right 08/27/2021   Procedure: COMPUTER ASSISTED TOTAL KNEE ARTHROPLASTY;  Surgeon: Fidel Rogue, MD;  Location: [facility];  Service: Orthopedics;  Laterality: Right;   KNEE CLOSED REDUCTION Right 11/20/2021   Procedure: CLOSED MANIPULATION KNEE;  Surgeon: Fidel Rogue, MD;  Location: [facility];  Service: Orthopedics;  Laterality: Right;   lumpectomy,benign, right breast     x2   RIGHT SHOULDER SURGERY      TOTAL KNEE REVISION Right 02/02/2023   Procedure: REVISION RIGHT TOTAL KNEE ARTHROPLASTY;  Surgeon: Addie Cordella Hamilton, MD;  Location: [facility] OR;  Service: Orthopedics;  Laterality: Right;   Social History   Occupational History   Occupation: food service  Tobacco Use   Smoking status: Never     Passive exposure: Never   Smokeless tobacco: Never  Vaping Use   Vaping status: Never Used  Substance and Sexual Activity   Alcohol  use: Yes    Alcohol /week: 0.0 standard drinks of alcohol     Comment: maybe twice a month   Drug use: No   Sexual activity: Not on file

## 2024-05-29 ENCOUNTER — Telehealth: Payer: Self-pay | Admitting: Radiology

## 2024-05-29 NOTE — Telephone Encounter (Signed)
 Patient called to inquire on surgery ordered by Luke/Dean.  I don't see new surg referral/order in chart, can you all please call her to update/discuss?  Thanks-

## 2024-06-02 ENCOUNTER — Telehealth: Payer: Self-pay | Admitting: Orthopedic Surgery

## 2024-06-02 NOTE — Telephone Encounter (Signed)
 Patient is scheduled for left carpal tunnel release with Dr. Addie 06-26-24 at [facility].  She wants to make sure her leave of absence for work is extended since she had the right carpal tunnel done in August and now having surgery on the left.  Patient's post op visit for this surgery is on 07/10/24 at 1:45pm.

## 2024-06-02 NOTE — Telephone Encounter (Signed)
Okay to extend

## 2024-06-05 ENCOUNTER — Telehealth: Payer: Self-pay | Admitting: Orthopedic Surgery

## 2024-06-05 NOTE — Telephone Encounter (Signed)
 Called and advised pt.

## 2024-06-05 NOTE — Telephone Encounter (Signed)
 Received call from patient. [employer] form for R CTS updated per 05/19/24 note. Advised patient to call [employer] to start a new claim for the Left side for upcoming surgery on 10/20 and will complete that leave for 6 weeks from date of surgery . Patient voiced understanding.

## 2024-06-05 NOTE — Telephone Encounter (Signed)
 She will need a new form as it for a different body part.

## 2024-06-13 ENCOUNTER — Encounter: Payer: Self-pay | Admitting: Family Medicine

## 2024-06-13 ENCOUNTER — Ambulatory Visit: Admitting: Family Medicine

## 2024-06-13 ENCOUNTER — Ambulatory Visit: Payer: Self-pay | Admitting: Family Medicine

## 2024-06-13 VITALS — BP 120/64 | HR 68 | Temp 98.0°F | Ht 61.0 in | Wt 151.6 lb

## 2024-06-13 DIAGNOSIS — D509 Iron deficiency anemia, unspecified: Secondary | ICD-10-CM | POA: Diagnosis not present

## 2024-06-13 DIAGNOSIS — Z7984 Long term (current) use of oral hypoglycemic drugs: Secondary | ICD-10-CM

## 2024-06-13 DIAGNOSIS — Z23 Encounter for immunization: Secondary | ICD-10-CM | POA: Diagnosis not present

## 2024-06-13 DIAGNOSIS — E119 Type 2 diabetes mellitus without complications: Secondary | ICD-10-CM | POA: Diagnosis not present

## 2024-06-13 DIAGNOSIS — Z0000 Encounter for general adult medical examination without abnormal findings: Secondary | ICD-10-CM

## 2024-06-13 DIAGNOSIS — D229 Melanocytic nevi, unspecified: Secondary | ICD-10-CM | POA: Diagnosis not present

## 2024-06-13 LAB — BASIC METABOLIC PANEL WITH GFR
BUN: 18 mg/dL (ref 6–23)
CO2: 27 meq/L (ref 19–32)
Calcium: 9.2 mg/dL (ref 8.4–10.5)
Chloride: 102 meq/L (ref 96–112)
Creatinine, Ser: 0.49 mg/dL (ref 0.40–1.20)
GFR: 98.06 mL/min (ref >60.00)
Glucose, Bld: 141 mg/dL — ABNORMAL HIGH (ref 70–99)
Potassium: 3.9 meq/L (ref 3.5–5.1)
Sodium: 139 meq/L (ref 135–145)

## 2024-06-13 LAB — LIPID PANEL
Cholesterol: 200 mg/dL (ref 0–200)
HDL: 51.8 mg/dL (ref >39.00)
LDL Cholesterol: 119 mg/dL — ABNORMAL HIGH (ref 0–99)
NonHDL: 148.35
Total CHOL/HDL Ratio: 4
Triglycerides: 145 mg/dL (ref 0.0–149.0)
VLDL: 29 mg/dL (ref 0.0–40.0)

## 2024-06-13 LAB — CBC WITH DIFFERENTIAL/PLATELET
Basophils Absolute: 0.1 K/uL (ref 0.0–0.1)
Basophils Relative: 0.8 % (ref 0.0–3.0)
Eosinophils Absolute: 0.2 K/uL (ref 0.0–0.7)
Eosinophils Relative: 2.3 % (ref 0.0–5.0)
HCT: 36.5 % (ref 36.0–46.0)
Hemoglobin: 11.8 g/dL — ABNORMAL LOW (ref 12.0–15.0)
Lymphocytes Relative: 31.2 % (ref 12.0–46.0)
Lymphs Abs: 2.2 K/uL (ref 0.7–4.0)
MCHC: 32.3 g/dL (ref 30.0–36.0)
MCV: 78.4 fl (ref 78.0–100.0)
Monocytes Absolute: 0.4 K/uL (ref 0.1–1.0)
Monocytes Relative: 5.8 % (ref 3.0–12.0)
Neutro Abs: 4.1 K/uL (ref 1.4–7.7)
Neutrophils Relative %: 59.9 % (ref 43.0–77.0)
Platelets: 305 K/uL (ref 150.0–400.0)
RBC: 4.66 Mil/uL (ref 3.87–5.11)
RDW: 17 % — ABNORMAL HIGH (ref 11.5–15.5)
WBC: 6.9 K/uL (ref 4.0–10.5)

## 2024-06-13 LAB — IBC + FERRITIN
Ferritin: 34.4 ng/mL (ref 10.0–291.0)
Iron: 66 ug/dL (ref 42–145)
Saturation Ratios: 18.1 % — ABNORMAL LOW (ref 20.0–50.0)
TIBC: 364 ug/dL (ref 250.0–450.0)
Transferrin: 260 mg/dL (ref 212.0–360.0)

## 2024-06-13 LAB — HEPATIC FUNCTION PANEL
ALT: 12 U/L (ref 0–35)
AST: 21 U/L (ref 0–37)
Albumin: 4.1 g/dL (ref 3.5–5.2)
Alkaline Phosphatase: 125 U/L — ABNORMAL HIGH (ref 39–117)
Bilirubin, Direct: 0.1 mg/dL (ref 0.0–0.3)
Total Bilirubin: 0.4 mg/dL (ref 0.2–1.2)
Total Protein: 7.9 g/dL (ref 6.0–8.3)

## 2024-06-13 LAB — HEMOGLOBIN A1C: Hgb A1c MFr Bld: 10.2 % — ABNORMAL HIGH (ref 4.6–6.5)

## 2024-06-13 LAB — TSH: TSH: 1.08 u[IU]/mL (ref 0.35–5.50)

## 2024-06-13 MED ORDER — ATORVASTATIN CALCIUM 10 MG PO TABS: 10.0000 mg | ORAL_TABLET | Freq: Every day | ORAL | 3 refills | Status: AC

## 2024-06-13 MED ORDER — METFORMIN HCL 500 MG PO TABS: 500.0000 mg | ORAL_TABLET | Freq: Two times a day (BID) | ORAL | 3 refills | Status: AC

## 2024-06-13 MED ORDER — LOSARTAN POTASSIUM 50 MG PO TABS: 50.0000 mg | ORAL_TABLET | Freq: Every day | ORAL | 3 refills | Status: AC

## 2024-06-13 MED ORDER — GLIPIZIDE 10 MG PO TABS: 10.0000 mg | ORAL_TABLET | Freq: Two times a day (BID) | ORAL | 3 refills | Status: AC

## 2024-06-13 NOTE — Progress Notes (Signed)
 Subjective:    Patient ID: [name] [name], female    DOB: [birth year], 68 y.o.   MRN: [id number]  HPI Here for a well exam. She has been doing well in general, but she says that the 2 moles on her chin have recently become tender and they itch. She has had these for many years, but they have never bothered her before. No change in appearance.    Review of Systems  Constitutional: Negative.   HENT: Negative.    Eyes: Negative.   Respiratory: Negative.    Cardiovascular: Negative.   Gastrointestinal: Negative.   Genitourinary:  Negative for decreased urine volume, difficulty urinating, dyspareunia, dysuria, enuresis, flank pain, frequency, hematuria, pelvic pain and urgency.  Musculoskeletal: Negative.   Skin: Negative.   Neurological: Negative.  Negative for headaches.  Psychiatric/Behavioral: Negative.         Objective:   Physical Exam Constitutional:      General: She is not in acute distress.    Appearance: Normal appearance. She is well-developed.  HENT:     Head: Normocephalic and atraumatic.     Right Ear: External ear normal.     Left Ear: External ear normal.     Nose: Nose normal.     Mouth/Throat:     Pharynx: No oropharyngeal exudate.  Eyes:     General: No scleral icterus.    Conjunctiva/sclera: Conjunctivae normal.     Pupils: Pupils are equal, round, and reactive to light.  Neck:     Thyroid : No thyromegaly.     Vascular: No JVD.  Cardiovascular:     Rate and Rhythm: Normal rate and regular rhythm.     Pulses: Normal pulses.     Heart sounds: Normal heart sounds. No murmur heard.    No friction rub. No gallop.  Pulmonary:     Effort: Pulmonary effort is normal. No respiratory distress.     Breath sounds: Normal breath sounds. No wheezing or rales.  Chest:     Chest wall: No tenderness.  Abdominal:     General: Bowel sounds are normal. There is no distension.     Palpations: Abdomen is soft. There is no mass.     Tenderness: There is no abdominal  tenderness. There is no guarding or rebound.  Musculoskeletal:        General: No tenderness. Normal range of motion.     Cervical back: Normal range of motion and neck supple.  Lymphadenopathy:     Cervical: No cervical adenopathy.  Skin:    General: Skin is warm and dry.     Findings: No erythema or rash.     Comments: There are two 4 mm skin colored nevi on the chin   Neurological:     General: No focal deficit present.     Mental Status: She is alert and oriented to person, place, and time.     Cranial Nerves: No cranial nerve deficit.     Motor: No abnormal muscle tone.     Coordination: Coordination normal.     Deep Tendon Reflexes: Reflexes are normal and symmetric. Reflexes normal.  Psychiatric:        Mood and Affect: Mood normal.        Behavior: Behavior normal.        Thought Content: Thought content normal.        Judgment: Judgment normal.           Assessment & Plan:  Well exam. We discussed diet and  exercise. Get fasting labs. The moles on her chin are inflamed and I think they should be removed. We will refer her to Dermatology to evaluate.  Garnette Olmsted, MD

## 2024-06-13 NOTE — Addendum Note (Signed)
 Addended by: LADONNA INOCENTE SAILOR on: 06/13/2024 11:25 AM   Modules accepted: Orders

## 2024-06-13 NOTE — Addendum Note (Signed)
 Addended by: JOHNNY SENIOR A on: 06/13/2024 04:57 PM   Modules accepted: Orders

## 2024-06-14 ENCOUNTER — Encounter: Payer: Self-pay | Admitting: Family Medicine

## 2024-06-14 ENCOUNTER — Ambulatory Visit: Admitting: Family Medicine

## 2024-06-14 VITALS — BP 120/68 | Temp 98.3°F | Wt 151.0 lb

## 2024-06-14 DIAGNOSIS — E1165 Type 2 diabetes mellitus with hyperglycemia: Secondary | ICD-10-CM | POA: Diagnosis not present

## 2024-06-14 DIAGNOSIS — Z7984 Long term (current) use of oral hypoglycemic drugs: Secondary | ICD-10-CM | POA: Diagnosis not present

## 2024-06-14 DIAGNOSIS — E785 Hyperlipidemia, unspecified: Secondary | ICD-10-CM | POA: Diagnosis not present

## 2024-06-14 NOTE — Progress Notes (Signed)
   Subjective:    Patient ID: [name] [name], female    DOB: 03-31-1956, 68 y.o.   MRN: [id number]  HPI She was here yesterday for a well exam, and she had labs drawn. Her A1c had gone up to 10.2% and the LDL was about where is had been at 119. She admits to not being as strict with her diet as she used to be. She is here today to discuss the lab results.    Review of Systems  Constitutional: Negative.   Respiratory: Negative.    Cardiovascular: Negative.        Objective:   Physical Exam Constitutional:      Appearance: Normal appearance.  Cardiovascular:     Rate and Rhythm: Normal rate and regular rhythm.     Pulses: Normal pulses.     Heart sounds: Normal heart sounds.  Pulmonary:     Effort: Pulmonary effort is normal.     Breath sounds: Normal breath sounds.  Neurological:     Mental Status: She is alert.           Assessment & Plan:  For the type 2 diabetes with hyperglycemia, we have added Glipizide  10 mg BID to the Metformin , and for the dyslipidemia we have added Atorvastatin 10 mg daily. She tighten up on her diet. Repeat labs in 90 days.  Garnette Olmsted, MD

## 2024-06-26 DIAGNOSIS — G5602 Carpal tunnel syndrome, left upper limb: Secondary | ICD-10-CM | POA: Diagnosis not present

## 2024-07-10 ENCOUNTER — Encounter: Admitting: Surgical

## 2024-07-10 ENCOUNTER — Encounter: Payer: Self-pay | Admitting: Radiology

## 2024-07-12 ENCOUNTER — Ambulatory Visit: Admitting: Surgical

## 2024-07-12 ENCOUNTER — Encounter: Payer: Self-pay | Admitting: Surgical

## 2024-07-12 DIAGNOSIS — G5603 Carpal tunnel syndrome, bilateral upper limbs: Secondary | ICD-10-CM

## 2024-07-12 NOTE — Progress Notes (Signed)
 Post-Op Visit Note   Patient: [name] [name]           Date of Birth: Sep 05, 1956           MRN: [id number] Visit Date: 07/12/2024 PCP: Johnny Garnette LABOR, MD   Assessment & Plan:  Chief Complaint:  Chief Complaint  Patient presents with   Left Wrist - Follow-up    Left carpal tunnel release 06/26/2024   Visit Diagnoses:  1. Bilateral carpal tunnel syndrome     Plan: [name] [name] is a 68 y.o. female who presents s/p left carpal tunnel release on 06/26/2024.  Patient has been compliant with weightbearing restrictions and not lift anything with the operative arm.  Pain is overall controlled.  Numbness and tingling is improved compared with preoperatively; it has completely resolved since surgery.  Denies any fevers, chills, night sweats, drainage from the surgical site.  Taking occasional Tylenol  and ibuprofen for pain control.  On exam, incision is healing well with sutures intact.  Sutures removed and replaced with Steri-Strips today.  2+ radial pulse of the operative extremity.  No sign of infection or dehiscence of the incision.  Intact EPL, FPL, finger abduction, wrist extension, finger adduction.  Plan is to hold off on any lifting more than about a pound with the operative hand.  No submersion underwater such as an a sink, bathtub, pool, hot tub.  Okay for showering.  Plan for return in 4 weeks for recheck on incision and once the incision heals, may increase lifting.  We will plan to keep her out of work for the next 6 weeks with reevaluation at her next appointment.  Follow-Up Instructions: No follow-ups on file.   Orders:  No orders of the defined types were placed in this encounter.  No orders of the defined types were placed in this encounter.   Imaging: No results found.  PMFS History: Patient Active Problem List   Diagnosis Date Noted   Type 2 diabetes mellitus with hyperglycemia (HCC) 06/14/2024   Iron  deficiency anemia 06/13/2024   Hepatic steatosis 06/01/2023    Elevated alkaline phosphatase level 06/01/2023   Pain due to total right knee replacement 02/07/2023   S/P revision of total knee, right 02/02/2023   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   Primary hypertension 03/09/2022   Osteoarthritis of right knee 08/27/2021   S/P total knee arthroplasty, 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   COVID-19 virus infection 04/09/2021   Dyslipidemia 03/31/2021   Urinary frequency 10/29/2017   Special screening for malignant neoplasms, colon 11/25/2011   Diverticulosis of colon 11/25/2011   URTICARIA 01/31/2010   ECZEMA 05/03/2009   Past Medical History:  Diagnosis Date   Arthritis    Complication of anesthesia    Diabetes mellitus without complication (HCC)    Gallstones    Hypertension    PONV (postoperative nausea and vomiting)    Ruptured ovarian cyst     Family History  Problem Relation Age of Onset   Cancer Other        breast cancer   Colon cancer Neg Hx    Esophageal cancer Neg Hx    Stomach cancer Neg Hx    Rectal cancer Neg Hx    Colon polyps Neg Hx     Past Surgical History:  Procedure Laterality Date   CARPAL TUNNEL RELEASE Right 04/10/2024   per Dr. Glendia Hutchinson   CESAREAN SECTION     COLONOSCOPY  06/05/2019   per Dr. Eda, single adenomatous polyp, repeat in 7  yrs   KNEE ARTHROPLASTY Right 08/27/2021   Procedure: COMPUTER ASSISTED TOTAL KNEE ARTHROPLASTY;  Surgeon: Fidel Rogue, MD;  Location: [facility];  Service: Orthopedics;  Laterality: Right;   KNEE CLOSED REDUCTION Right 11/20/2021   Procedure: CLOSED MANIPULATION KNEE;  Surgeon: Fidel Rogue, MD;  Location: [facility];  Service: Orthopedics;  Laterality: Right;   lumpectomy,benign, right breast     x2   RIGHT SHOULDER SURGERY      TOTAL KNEE REVISION Right 02/02/2023   Procedure: REVISION RIGHT TOTAL KNEE ARTHROPLASTY;  Surgeon: Addie Cordella Hamilton, MD;  Location: [facility] OR;  Service: Orthopedics;  Laterality: Right;   Social History   Occupational History   Occupation:  food service  Tobacco Use   Smoking status: Never    Passive exposure: Never   Smokeless tobacco: Never  Vaping Use   Vaping status: Never Used  Substance and Sexual Activity   Alcohol  use: Yes    Alcohol /week: 0.0 standard drinks of alcohol     Comment: maybe twice a month   Drug use: No   Sexual activity: Not on file

## 2024-08-01 ENCOUNTER — Telehealth: Payer: Self-pay

## 2024-08-01 NOTE — Telephone Encounter (Signed)
 Noted

## 2024-08-01 NOTE — Telephone Encounter (Signed)
 Copied from CRM #[id number]. Topic: General - Other >> Jul 28, 2024  9:17 AM Macario HERO wrote: Reason for CRM: Carolyne from [facility] called to advised that patient is able to participate in their program. Callback: [phone]

## 2024-08-02 ENCOUNTER — Ambulatory Visit: Admitting: Family Medicine

## 2024-08-02 ENCOUNTER — Encounter: Payer: Self-pay | Admitting: Family Medicine

## 2024-08-02 ENCOUNTER — Ambulatory Visit: Payer: Self-pay

## 2024-08-02 ENCOUNTER — Ambulatory Visit (INDEPENDENT_AMBULATORY_CARE_PROVIDER_SITE_OTHER)

## 2024-08-02 VITALS — BP 124/68 | HR 88 | Temp 98.5°F | Wt 160.0 lb

## 2024-08-02 DIAGNOSIS — R053 Chronic cough: Secondary | ICD-10-CM

## 2024-08-02 NOTE — Progress Notes (Signed)
   Subjective:    Patient ID: [name] [name], female    DOB: 1956/06/13, 68 y.o.   MRN: [id number]  HPI Here for 3 weeks of a dry cough. This is not productive. No fever or PND. Ne heartburn or indigestion. She gets no relief with Robitussin or Delsym. She occassionally get SOB wit this. She says it is the exact same type of cough that she had last winter. She saw us  several times between last November and last February. She took several courses of antibiotics and she took a Medrol  dose pack, but nothing helped. She was also given an albuterol  inhaler, and she has tried this a few times in the past few weeks with no relief.    Review of Systems  Constitutional: Negative.   HENT: Negative.    Eyes: Negative.   Respiratory:  Positive for cough and shortness of breath. Negative for choking and wheezing.   Cardiovascular: Negative.        Objective:   Physical Exam Constitutional:      Appearance: Normal appearance.  Cardiovascular:     Rate and Rhythm: Normal rate and regular rhythm.     Pulses: Normal pulses.     Heart sounds: Normal heart sounds.  Pulmonary:     Effort: Pulmonary effort is normal.     Breath sounds: Normal breath sounds.  Musculoskeletal:     Right lower leg: No edema.     Left lower leg: No edema.  Neurological:     Mental Status: She is alert.           Assessment & Plan:  Chronic cough, possibly allergic in nature. She can us  the inhaler as needed. We will get a CXR today, and we will refer her to Pulmonology for this.  Garnette Olmsted, MD

## 2024-08-02 NOTE — Telephone Encounter (Signed)
 FYI Only or Action Required?: FYI only for provider: appointment scheduled on 08/02/2024.  Patient was last seen in primary care on 06/14/2024 by Johnny Garnette LABOR, MD.  Called Nurse Triage reporting Cough.  Symptoms began worsening x week.  Interventions attempted: OTC medications: cold medication.  Symptoms are: gradually worsening.  Triage Disposition: See Physician Within 24 Hours  Patient/caregiver understands and will follow disposition?: Yes    Copied from CRM #[id number]. Topic: Clinical - Red Word Triage >> Aug 02, 2024  8:06 AM Adelita E wrote: [facility] that prompted transfer to Nurse Triage: Productive cough, going on for one week. Coughing up green and clear phlegm. Reason for Disposition  SEVERE coughing spells (e.g., whooping sound after coughing, vomiting after coughing)  Answer Assessment - Initial Assessment Questions 1. ONSET: When did the cough begin?      2 to 3 weeks ago and worsening x 1 week 2. SEVERITY: How bad is the cough today?      moderate 3. SPUTUM: Describe the color of your sputum (e.g., none, dry cough; clear, white, yellow, green)     Green and clear 4. HEMOPTYSIS: Are you coughing up any blood? If Yes, ask: How much? (e.g., flecks, streaks, tablespoons, etc.)     na 5. DIFFICULTY BREATHING: Are you having difficulty breathing? If Yes, ask: How bad is it? (e.g., mild, moderate, severe)      no 6. FEVER: Do you have a fever? If Yes, ask: What is your temperature, how was it measured, and when did it start?     no 7. CARDIAC HISTORY: Do you have any history of heart disease? (e.g., heart attack, congestive heart failure)      na 8. LUNG HISTORY: Do you have any history of lung disease?  (e.g., pulmonary embolus, asthma, emphysema)     na 9. PE RISK FACTORS: Do you have a history of blood clots? (or: recent major surgery, recent prolonged travel, bedridden)     nan 10. OTHER SYMPTOMS: Do you have any other symptoms?  (e.g., runny nose, wheezing, chest pain)       na 11. PREGNANCY: Is there any chance you are pregnant? When was your last menstrual period?       na 12. TRAVEL: Have you traveled out of the country in the last month? (e.g., travel history, exposures)       Na  Tried muccinex and robitussin without relief  Protocols used: Cough - Acute Productive-A-AH

## 2024-08-02 NOTE — Telephone Encounter (Signed)
 Pt has an appointment with Dr Johnny this morning

## 2024-08-08 ENCOUNTER — Ambulatory Visit: Payer: Self-pay | Admitting: Family Medicine

## 2024-08-10 ENCOUNTER — Ambulatory Visit (INDEPENDENT_AMBULATORY_CARE_PROVIDER_SITE_OTHER): Admitting: Surgical

## 2024-08-10 ENCOUNTER — Encounter: Payer: Self-pay | Admitting: Surgical

## 2024-08-10 DIAGNOSIS — G5603 Carpal tunnel syndrome, bilateral upper limbs: Secondary | ICD-10-CM

## 2024-08-10 NOTE — Progress Notes (Signed)
 Post-Op Visit Note   Patient: [name] [name]           Date of Birth: 1956-07-02           MRN: [id number] Visit Date: 08/10/2024 PCP: Johnny Garnette LABOR, MD   Assessment & Plan:  Chief Complaint:  Chief Complaint  Patient presents with   Left Wrist - Routine Post Op    06/26/24 Left CTR   Visit Diagnoses: No diagnosis found.  Plan: Patient is a 68 year old female who presents s/p left carpal tunnel release on 06/26/2024.  Doing well overall.  Still has a lot of sensitivity around the incision and some nighttime pain around the base of the palm both on the ulnar and radial aspects.  She has history of CMC arthritis and has responded well to cortisone injection in the past.  May be a combination of CMC arthritis and her postop pillar pain.  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  All of her numbness and tingling has completely resolved.  On exam, patient has incision that is well-healed.  She has intact EPL, FPL, finger abduction, thumb abduction.  2+ radial pulse of the operative extremity.  No evidence of infection or dehiscence.  Plan at this time is to return to work in 1 month with the first week being 5-hour days and then she will return to full capacity.  She will follow-up with the office as needed if pain does not improve or if she has difficulty transitioning back to her job.  All questions answered by her and her son.  She will follow-up as needed.  Follow-Up Instructions: No follow-ups on file.   Orders:  No orders of the defined types were placed in this encounter.  No orders of the defined types were placed in this encounter.   Imaging: No results found.  PMFS History: Patient Active Problem List   Diagnosis Date Noted   Type 2 diabetes mellitus with hyperglycemia (HCC) 06/14/2024   Iron  deficiency anemia  06/13/2024   Hepatic steatosis 06/01/2023   Elevated alkaline phosphatase level 06/01/2023   Pain due to total right knee replacement 02/07/2023   S/P revision of total knee, right 02/02/2023   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   Primary hypertension 03/09/2022   Osteoarthritis of right knee 08/27/2021   S/P total knee arthroplasty, 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   COVID-19 virus infection 04/09/2021   Dyslipidemia 03/31/2021   Urinary frequency 10/29/2017   Special screening for malignant neoplasms, colon 11/25/2011   Diverticulosis of colon 11/25/2011   URTICARIA 01/31/2010   ECZEMA 05/03/2009   Past Medical History:  Diagnosis Date   Arthritis    Complication of anesthesia    Diabetes mellitus without complication (HCC)    Gallstones    Hypertension    PONV (postoperative nausea and vomiting)    Ruptured ovarian cyst     Family History  Problem Relation Age of Onset   Cancer Other        breast cancer   Colon cancer Neg Hx    Esophageal cancer Neg Hx    Stomach cancer Neg Hx    Rectal cancer Neg Hx    Colon polyps Neg Hx     Past Surgical History:  Procedure Laterality Date   CARPAL TUNNEL RELEASE Right 04/10/2024   per  Dr. Glendia Hutchinson   CESAREAN SECTION     COLONOSCOPY  06/05/2019   per Dr. Eda, single adenomatous polyp, repeat in 7 yrs   KNEE ARTHROPLASTY Right 08/27/2021   Procedure: COMPUTER ASSISTED TOTAL KNEE ARTHROPLASTY;  Surgeon: Fidel Rogue, MD;  Location: [facility];  Service: Orthopedics;  Laterality: Right;   KNEE CLOSED REDUCTION Right 11/20/2021   Procedure: CLOSED MANIPULATION KNEE;  Surgeon: Fidel Rogue, MD;  Location: [facility];  Service: Orthopedics;  Laterality: Right;   lumpectomy,benign, right breast     x2   RIGHT SHOULDER SURGERY      TOTAL KNEE REVISION Right 02/02/2023   Procedure: REVISION RIGHT TOTAL KNEE ARTHROPLASTY;  Surgeon: Hutchinson Cordella Glendia, MD;  Location: [facility] OR;  Service: Orthopedics;  Laterality: Right;   Social  History   Occupational History   Occupation: food service  Tobacco Use   Smoking status: Never    Passive exposure: Never   Smokeless tobacco: Never  Vaping Use   Vaping status: Never Used  Substance and Sexual Activity   Alcohol  use: Yes    Alcohol /week: 0.0 standard drinks of alcohol     Comment: maybe twice a month   Drug use: No   Sexual activity: Not on file

## 2024-08-16 ENCOUNTER — Ambulatory Visit: Admitting: Pulmonary Disease

## 2024-08-16 ENCOUNTER — Encounter: Payer: Self-pay | Admitting: Pulmonary Disease

## 2024-08-16 VITALS — BP 114/52 | HR 84 | Temp 98.0°F | Ht 61.0 in | Wt 160.0 lb

## 2024-08-16 DIAGNOSIS — R053 Chronic cough: Secondary | ICD-10-CM | POA: Diagnosis not present

## 2024-08-16 DIAGNOSIS — R059 Cough, unspecified: Secondary | ICD-10-CM

## 2024-08-16 LAB — POCT EXHALED NITRIC OXIDE: FeNO level (ppb): 21 (ref ?–50)

## 2024-08-16 MED ORDER — FLUTICASONE PROPIONATE 50 MCG/ACT NA SUSP: 2.0000 | Freq: Every day | NASAL | 3 refills | Status: AC

## 2024-08-16 NOTE — Progress Notes (Signed)
 "              [name] [name]    [id number]    March 24, 1956  Primary Care Physician:Fry, Garnette LABOR, MD  Referring Physician: Johnny Garnette LABOR, MD [address] [place],  [place] [address]  Chief complaint:  Consult for cough  HPI: 68 y.o. who  has a past medical history of Arthritis, Complication of anesthesia, Diabetes mellitus without complication (HCC), Gallstones, Hypertension, PONV (postoperative nausea and vomiting), and Ruptured ovarian cyst.  Discussed the use of AI scribe software for clinical note transcription with the patient, who gave verbal consent to proceed.  History of Present Illness [name] [name] is a 68 year old female who presents with chronic cough.  Chronic cough - Persistent for three years, with episodes lasting months - Fluctuates between frequent coughing and minimal symptoms - Unresponsive to antibiotics, prednisone, and albuterol  inhaler - Cough sometimes causes urinary incontinence - Occasionally associated with nighttime wheezing  Oropharyngeal symptoms and swallowing dysfunction - Intermittent throat irritation - Frequent throat clearing - Coughing when drinking water , as if liquid goes down the wrong way  Associated symptoms and negative review of systems - No acid reflux - No sinus congestion - No post-nasal drip  Comorbidities - Diabetes  Relevant Pulmonary history: Pets: No pets Occupation: Exposures: Mold, hot tub, Jacuzzi.  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   BAYER LOW DOSE 81 MG chewable tablet CHEW 1 TABLET (81 MG TOTAL) BY MOUTH 2 (TWO) TIMES DAILY.   calcium   carbonate (OSCAL) 1500 (600 Ca) MG TABS tablet Take by mouth 2 (two) times daily with a meal.   cholecalciferol (VITAMIN D3) 25 MCG (1000 UNIT) tablet Take 2,000 Units by mouth daily.   glipiZIDE  (GLUCOTROL ) 10 MG tablet Take 1 tablet (10 mg total) by mouth 2 (two) times daily before a meal.   Iron , Ferrous Sulfate , 325 (65 Fe) MG TABS Take 325 mg by mouth daily.   losartan  (COZAAR ) 50 MG tablet Take 1 tablet (50 mg total) by mouth daily.   meloxicam  (MOBIC ) 7.5 MG tablet Take 1 tablet (7.5 mg total) by mouth daily. (Patient taking differently: Take 7.5 mg by mouth daily. Every 2-3 months)   metFORMIN  (GLUCOPHAGE ) 500 MG tablet Take 1 tablet (500 mg total) by mouth 2 (two) times daily with a meal.   Omega-3 Fatty Acids (FISH OIL PO) Take 1 capsule by mouth daily.   methocarbamol  (ROBAXIN ) 500 MG tablet Take 1 tablet (500 mg total) by mouth every 8 (eight) hours as needed for muscle spasms. (Patient not taking: Reported on 08/16/2024)   traMADol  (ULTRAM ) 50 MG tablet Take 1 tablet (50 mg total) by mouth every 6 (six) hours as needed. (Patient not taking: Reported on 08/16/2024)   triamcinolone  cream (KENALOG ) 0.1 % Apply 1 Application topically 2 (two) times daily. (Patient not taking: Reported on 08/16/2024)   No facility-administered encounter medications on file as of 08/16/2024.     Physical Exam: Today's Vitals   08/16/24 0932  BP: (!) 137/57  Pulse: 84  Temp: 98 F (36.7 C)  TempSrc: Oral  SpO2: 96%  Weight: 160 lb (72.6 kg)  Height: 5' 1 (1.549  m)   Body mass index is 30.23 kg/m.  Physical Exam GEN: No acute distress. CV: Regular rate and rhythm, no murmurs. LUNGS: Clear to auscultation bilaterally, normal respiratory effort. SKIN JOINTS: Warm and dry, no rash.    Data Reviewed: Imaging: CT abdomen pelvis 02/16/2019-visualized lung clear. X-ray 08/02/2024-no acute abnormalities. I reviewed the images personally.  PFTs:  FeNO 08/16/2024-21  Labs: Assessment &  Plan Chronic cough likely due to upper airway cough syndrome (postnasal drip) Chronic cough persisting for three to four years, exacerbated by postnasal drip and throat irritation. Previous treatments with antibiotics, prednisone, and albuterol  inhaler were ineffective. Chest x-ray normal, FENO test normal, suggesting no significant asthma or lung inflammation. Differential diagnosis includes upper airway cough syndrome, silent reflux, and asthma. No significant exposure to lung irritants or family history of lung disease. Symptoms include throat irritation, need to clear throat, and cough triggered by drinking fluids. - Prescribed steroid nasal spray. - Recommended over-the-counter chlorpheniramine (4 mg tablets) 1-2 tablets three times a day, with caution for drowsiness. - Ordered chest CT to evaluate for subtle lung inflammation. - Scheduled lung function tests.  Recommendations: Steroid nasal spray, antihistamine CT, PFTs  Lonna Coder MD [facility] 08/16/2024, 9:41 AM  CC: Johnny Garnette LABOR, MD   "

## 2024-08-16 NOTE — Patient Instructions (Signed)
°  VISIT SUMMARY: Today, we discussed your chronic cough, which has been persistent for three years and has not responded to previous treatments. We also reviewed your symptoms related to throat irritation and swallowing difficulties.  YOUR PLAN: CHRONIC COUGH: Your chronic cough has been ongoing for three years and has not improved with previous treatments. It is likely due to upper airway cough syndrome, which can be related to postnasal drip and throat irritation. -Start using a steroid nasal spray as prescribed. -Take over-the-counter chlorpheniramine (4 mg tablets) 1-2 tablets three times a day. Be cautious as it may cause drowsiness. -A chest CT has been ordered to check for any subtle lung inflammation. -Lung function tests have been scheduled to further evaluate your condition.  OROPHARYNGEAL SYMPTOMS AND SWALLOWING DYSFUNCTION: You have been experiencing throat irritation, frequent throat clearing, and coughing when drinking fluids, which may be related to your chronic cough. -Monitor your symptoms and report any changes or worsening. -Continue to avoid any known irritants that may exacerbate your symptoms.

## 2024-08-17 ENCOUNTER — Encounter: Payer: Self-pay | Admitting: Pulmonary Disease

## 2024-08-23 ENCOUNTER — Inpatient Hospital Stay: Admission: RE | Admit: 2024-08-23 | Discharge: 2024-08-23 | Attending: Pulmonary Disease

## 2024-08-23 DIAGNOSIS — R059 Cough, unspecified: Secondary | ICD-10-CM

## 2024-08-30 ENCOUNTER — Ambulatory Visit: Payer: Self-pay | Admitting: Pulmonary Disease

## 2024-11-22 ENCOUNTER — Ambulatory Visit: Admitting: Pulmonary Disease

## 2024-11-22 ENCOUNTER — Encounter
